# Patient Record
Sex: Female | Born: 1960
Health system: Southern US, Community
[De-identification: ages and names within clinical notes are randomized; demographics above are authoritative.]

## PROBLEM LIST (undated history)

## (undated) ENCOUNTER — Emergency Department (HOSPITAL_COMMUNITY): Discharge status: Absent without leave | Payer: Medicaid Other

## (undated) DIAGNOSIS — G479 Sleep disorder, unspecified: Secondary | ICD-10-CM

## (undated) DIAGNOSIS — K649 Unspecified hemorrhoids: Secondary | ICD-10-CM

## (undated) DIAGNOSIS — I10 Essential (primary) hypertension: Secondary | ICD-10-CM

## (undated) DIAGNOSIS — G4730 Sleep apnea, unspecified: Secondary | ICD-10-CM

## (undated) DIAGNOSIS — E7800 Pure hypercholesterolemia, unspecified: Secondary | ICD-10-CM

## (undated) DIAGNOSIS — E119 Type 2 diabetes mellitus without complications: Secondary | ICD-10-CM

## (undated) DIAGNOSIS — M1990 Unspecified osteoarthritis, unspecified site: Secondary | ICD-10-CM

## (undated) HISTORY — DX: Unspecified osteoarthritis, unspecified site: M19.90

## (undated) HISTORY — DX: Sleep apnea, unspecified: G47.30

## (undated) HISTORY — DX: Unspecified hemorrhoids: K64.9

## (undated) HISTORY — DX: Sleep disorder, unspecified: G47.9

## (undated) NOTE — *Deleted (*Deleted)
It was great to see you!  Our plans for today:  - *** -   We are checking some labs today, I will call you if they are abnormal will send you a MyChart message or a letter if they are normal.  If you do not hear about your labs in the next 2 weeks please let us know.***  Take care and seek immediate care sooner if you develop any concerns.   Dr. [name]  

---

## 1997-12-25 ENCOUNTER — Inpatient Hospital Stay (HOSPITAL_COMMUNITY): Admission: AD | Admit: 1997-12-25 | Discharge: 1997-12-25 | Payer: Self-pay | Admitting: *Deleted

## 1998-01-01 ENCOUNTER — Inpatient Hospital Stay (HOSPITAL_COMMUNITY): Admission: AD | Admit: 1998-01-01 | Discharge: 1998-01-03 | Payer: Self-pay | Admitting: *Deleted

## 1998-01-01 ENCOUNTER — Inpatient Hospital Stay (HOSPITAL_COMMUNITY): Admission: AD | Admit: 1998-01-01 | Discharge: 1998-01-01 | Payer: Self-pay | Admitting: *Deleted

## 1998-05-18 DIAGNOSIS — M1990 Unspecified osteoarthritis, unspecified site: Secondary | ICD-10-CM

## 1998-05-18 HISTORY — DX: Unspecified osteoarthritis, unspecified site: M19.90

## 2002-06-27 ENCOUNTER — Inpatient Hospital Stay (HOSPITAL_COMMUNITY): Admission: EM | Admit: 2002-06-27 | Discharge: 2002-06-28 | Payer: Self-pay | Admitting: Emergency Medicine

## 2002-06-27 ENCOUNTER — Encounter: Payer: Self-pay | Admitting: Emergency Medicine

## 2002-07-03 ENCOUNTER — Encounter: Admission: RE | Admit: 2002-07-03 | Discharge: 2002-07-03 | Payer: Self-pay | Admitting: Family Medicine

## 2003-05-19 DIAGNOSIS — I10 Essential (primary) hypertension: Secondary | ICD-10-CM

## 2003-05-19 DIAGNOSIS — E7800 Pure hypercholesterolemia, unspecified: Secondary | ICD-10-CM

## 2003-05-19 DIAGNOSIS — E119 Type 2 diabetes mellitus without complications: Secondary | ICD-10-CM

## 2003-05-19 HISTORY — DX: Essential (primary) hypertension: I10

## 2003-05-19 HISTORY — DX: Pure hypercholesterolemia, unspecified: E78.00

## 2003-05-19 HISTORY — DX: Type 2 diabetes mellitus without complications: E11.9

## 2004-04-09 ENCOUNTER — Emergency Department (HOSPITAL_COMMUNITY): Admission: EM | Admit: 2004-04-09 | Discharge: 2004-04-09 | Payer: Self-pay | Admitting: Emergency Medicine

## 2004-05-16 ENCOUNTER — Emergency Department (HOSPITAL_COMMUNITY): Admission: EM | Admit: 2004-05-16 | Discharge: 2004-05-16 | Payer: Self-pay | Admitting: Family Medicine

## 2005-04-07 ENCOUNTER — Emergency Department (HOSPITAL_COMMUNITY): Admission: EM | Admit: 2005-04-07 | Discharge: 2005-04-07 | Payer: Self-pay | Admitting: Family Medicine

## 2005-07-16 ENCOUNTER — Ambulatory Visit (HOSPITAL_COMMUNITY): Admission: RE | Admit: 2005-07-16 | Discharge: 2005-07-16 | Payer: Self-pay | Admitting: Obstetrics

## 2005-07-20 ENCOUNTER — Ambulatory Visit (HOSPITAL_COMMUNITY): Admission: RE | Admit: 2005-07-20 | Discharge: 2005-07-20 | Payer: Self-pay | Admitting: Obstetrics

## 2006-04-30 ENCOUNTER — Emergency Department (HOSPITAL_COMMUNITY): Admission: EM | Admit: 2006-04-30 | Discharge: 2006-04-30 | Payer: Self-pay | Admitting: Emergency Medicine

## 2007-10-17 ENCOUNTER — Inpatient Hospital Stay (HOSPITAL_COMMUNITY): Admission: EM | Admit: 2007-10-17 | Discharge: 2007-10-19 | Payer: Self-pay | Admitting: Emergency Medicine

## 2007-10-18 HISTORY — PX: CARDIAC CATHETERIZATION: SHX172

## 2007-11-14 ENCOUNTER — Emergency Department (HOSPITAL_COMMUNITY): Admission: EM | Admit: 2007-11-14 | Discharge: 2007-11-14 | Payer: Self-pay | Admitting: Emergency Medicine

## 2008-02-13 ENCOUNTER — Emergency Department (HOSPITAL_COMMUNITY): Admission: EM | Admit: 2008-02-13 | Discharge: 2008-02-13 | Payer: Self-pay | Admitting: Emergency Medicine

## 2008-05-18 DIAGNOSIS — G4730 Sleep apnea, unspecified: Secondary | ICD-10-CM

## 2008-05-18 HISTORY — DX: Sleep apnea, unspecified: G47.30

## 2009-10-04 ENCOUNTER — Emergency Department (HOSPITAL_COMMUNITY): Admission: EM | Admit: 2009-10-04 | Discharge: 2009-10-04 | Payer: Self-pay | Admitting: Emergency Medicine

## 2010-06-10 ENCOUNTER — Emergency Department (HOSPITAL_COMMUNITY)
Admission: EM | Admit: 2010-06-10 | Discharge: 2010-06-10 | Payer: Self-pay | Source: Home / Self Care | Admitting: Emergency Medicine

## 2010-06-11 LAB — COMPREHENSIVE METABOLIC PANEL
Albumin: 3.9 g/dL (ref 3.5–5.2)
Alkaline Phosphatase: 68 U/L (ref 39–117)
BUN: 9 mg/dL (ref 6–23)
Calcium: 9 mg/dL (ref 8.4–10.5)
Creatinine, Ser: 0.73 mg/dL (ref 0.4–1.2)
GFR calc Af Amer: >60 mL/min (ref >60)
GFR calc non Af Amer: >60 mL/min (ref >60)
Glucose, Bld: 127 mg/dL — ABNORMAL HIGH (ref 70–99)
Potassium: 3.5 mEq/L (ref 3.5–5.1)
Sodium: 138 mEq/L (ref 135–145)
Total Protein: 7 g/dL (ref 6.0–8.3)

## 2010-06-11 LAB — CBC
HCT: 41.9 % (ref 36.0–46.0)
Hemoglobin: 14.2 g/dL (ref 12.0–15.0)
MCH: 27.7 pg (ref 26.0–34.0)
MCHC: 33.9 g/dL (ref 30.0–36.0)
MCV: 81.8 fL (ref 78.0–100.0)
RBC: 5.12 MIL/uL — ABNORMAL HIGH (ref 3.87–5.11)
RDW: 14.2 % (ref 11.5–15.5)
WBC: 8 10*3/uL (ref 4.0–10.5)

## 2010-06-11 LAB — URINE MICROSCOPIC-ADD ON

## 2010-06-11 LAB — URINALYSIS, ROUTINE W REFLEX MICROSCOPIC
Ketones, ur: NEGATIVE mg/dL
Leukocytes, UA: NEGATIVE
Nitrite: POSITIVE — AB
Protein, ur: NEGATIVE mg/dL
Urine Glucose, Fasting: NEGATIVE mg/dL
Urobilinogen, UA: 1 mg/dL (ref 0.0–1.0)
pH: 5.5 (ref 5.0–8.0)

## 2010-06-11 LAB — LIPASE, BLOOD: Lipase: 21 U/L (ref 11–59)

## 2010-06-11 LAB — DIFFERENTIAL
Basophils Relative: 0 % (ref 0–1)
Eosinophils Absolute: 0.1 10*3/uL (ref 0.0–0.7)
Eosinophils Relative: 2 % (ref 0–5)
Lymphocytes Relative: 21 % (ref 12–46)
Lymphs Abs: 1.7 10*3/uL (ref 0.7–4.0)
Monocytes Absolute: 0.3 10*3/uL (ref 0.1–1.0)
Monocytes Relative: 4 % (ref 3–12)
Neutro Abs: 5.8 10*3/uL (ref 1.7–7.7)
Neutrophils Relative %: 73 % (ref 43–77)

## 2010-06-12 LAB — URINE CULTURE
Colony Count: >=100000
Culture  Setup Time: 201201241858

## 2010-09-30 NOTE — Cardiovascular Report (Signed)
[name], [name] NO.:  [id number]   MEDICAL RECORD NO.:  [id number]          PATIENT TYPE:  INP   LOCATION:  3703                         FACILITY:  [facility]   PHYSICIAN:  Nicki Guadalajara, M.D.     DATE OF BIRTH:  [birth year]   DATE OF PROCEDURE:  10/18/2007  DATE OF DISCHARGE:                            CARDIAC CATHETERIZATION   INDICATIONS:  [name] is a 50 year old African American  female who has a history of morbid obesity, tobacco use, and strong  family history of coronary artery disease.  She was admitted to [facility] last evening after experiencing several hours of  substernal chest pressure/tightness.  Her ECG showed nonspecific T  changes.  Symptom complex was worrisome for possible unstable angina.  She was treated with nitroglycerin and heparin.  With her significant  obesity and risk factors definitive cardiac catheterization was  recommended.   PROCEDURE:  After premedication with Versed 2 mg intravenously, the  patient was prepped and draped in usual fashion.  Her right femoral  artery was punctured anteriorly and a 5-French sheath was inserted.  Diagnostic catheterization was done utilizing 5-French Judkins for left  and right coronary catheters.  A 5-French pigtail catheter was used for  biplane cine left ventriculography.  With the patient's significant  hypertensive history.  Distal aortography was also performed to make  sure and she did not have a renovascular etiology to her hypertension.   She tolerated the procedure well.  Hemostasis was obtained by direct  manual pressure.   HEMODYNAMIC DATA:  Initial central aortic pressure is 170/96, left  ventricle pressure 170/17, and post A-wave 25.   On pullback, left ventricle pressure is 182/22, post A-wave 30, and  central aortic pressure is 182/102.  Of note, at the completion of the  procedure the patient also received 10 mg IV labetalol.   ANGIOGRAPHIC DATA:  Left  main coronary artery was angiographically  normal and bifurcated into an LAD and left circumflex system.   The LAD was angiographically normal, gave rise to one major diagonal  vessel and central and peripheral arteries.   The left circumflex coronary artery  was angiographically normal and  gave rise to a PD and PLA system.   The right coronary was angiographically normal, small-caliber vessel.   Biplane cine left ventriculography revealed normal LV contractility.  There was a suggestion of left ventricle hypertrophy.   Distal aortography revealed a normal aortoiliac system.  Specifically,  there was no evidence for renal artery stenosis.   IMPRESSION:  1. Normal left ventricular function with suggestion of left ventricle      hypertrophy.  2. Systemic hypertension.  3. Normal coronary arteries.           ______________________________  Nicki Guadalajara, M.D.     TK/MEDQ  D:  10/18/2007  T:  10/19/2007  Job:  [id number]

## 2010-09-30 NOTE — Discharge Summary (Signed)
NAMEREITA, Tanya Owens NO.:  [id number]   MEDICAL RECORD NO.:  [id number]          PATIENT TYPE:  INP   LOCATION:  3703                         FACILITY:  [facility]   PHYSICIAN:  Nicki Guadalajara, M.D.     DATE OF BIRTH:  May 29, 1960   DATE OF ADMISSION:  10/17/2007  DATE OF DISCHARGE:  10/19/2007                               DISCHARGE SUMMARY   DISCHARGE DIAGNOSES:  1. Acute new-onset chest pain with abnormal EKG and negative cardiac      enzymes, status post catheterization with normal coronaries.  2. Hypertension.  3. Suspected obstructive sleep apnea - Needs sleep studies.  4. Morbid obesity.  5. Hyperglycemia with mildly elevated hemoglobin A1c.  6. Dyslipidemia with LDL of 111, low HDL of 29, and elevated      triglycerides to 219 with normal total cholesterol.  7. Ongoing tobacco use.   This is a 50 year old, obese, African-American female without any  previous history of cardiac disease who presented to the emergency room  with complaints of a chest pain.  She ate lunch and then probably an  half hour after, developed substernal chest pain, which she described as  a sledgehammer with radiation to the left chest.  She also felt  diaphoretic, but did not have any nausea or shortness of breath.  She  rated her pain as 10/10 and presented to the emergency room where she  was evaluated and admitted to the hospital to rule out MI protocol.  She  was started immediately on IV nitroglycerin and heparin with improvement  of symptoms.   The following day, Dr. Tresa Endo performed coronary angiography that  revealed normal coronaries and normal left ventricular systolic  function.   LABORATORY DATA:  BMP showed sodium 142, potassium 4.0, chloride 112,  CO2 24, glucose 125, BUN 8, and creatinine 0.91.  The BMET on October 18, 2007, showed elevated glucose as well at 126.  Her hemoglobin A1c was  mildly elevated at 6.6.  Cardiac panel, first set revealed CK 128, CK-MB  2.0, and  troponin 0.01.  Second set, CK 98, CK-MB 1.5, and troponin less  than 0.01.  Third set, CK 115, CK-MB 1.3, and troponin less than 0.01.   Lipid profile showed total cholesterol 184, triglycerides 219, HDL 29,  LDL 111, and CBC showed white blood cell count 8.3, hemoglobin of 12,  hematocrit 34.8, and platelet count 233.   DISCHARGE MEDICATIONS:  1. Norvasc 5 mg daily.  2. Toprol-XL 25 mg daily.  3. Protonix 40 mg daily.  4. Zocor 20 mg daily.   DISCHARGE INSTRUCTIONS:  The patient is to stay on low-fat, low-  cholesterol diet, and avoid sweets and sugar.  She was instructed not to  drive or lift greater than 5 pounds for few days post cath and increase  activity slowly.   The patient was informed to discontinue smoking, but the issue would  have to be addressed and pressed as outpatient.   Outpatient follow up with Dr. Tresa Endo in our office on June 19 at 8:45.  We also would like to refer her to  Dr. Lurene Shadow, which could be done as  outpatient after we see her in our office.      Raymon Mutton, P.A.    ______________________________  Nicki Guadalajara, M.D.    MK/MEDQ  D:  10/19/2007  T:  10/20/2007  Job:  [id number]   cc:   [facility] Dr. Estelle June, M.D.

## 2010-10-03 NOTE — H&P (Signed)
NAME:  Tanya Owens, Tanya Owens                        ACCOUNT NO.:  [id number]   MEDICAL RECORD NO.:  [id number]                   PATIENT TYPE:  INP   LOCATION:  1824                                 FACILITY:  [facility]   PHYSICIAN:  Santiago Bumpers. Hensel, M.D.             DATE OF BIRTH:  1961/01/03   DATE OF ADMISSION:  06/27/2002  DATE OF DISCHARGE:                                HISTORY & PHYSICAL   CHIEF COMPLAINT:  Dizziness and chest pain.   HISTORY OF PRESENT ILLNESS:  This 50 year old complains of dizziness and  lightheadedness with decreased ability to walk at approximately 3 a.m.  today.  She states she awoke from sleep due to some sharp substernal chest  pain.  When she rose to walk to the bathroom, she had severe dizziness and  had to use assistance with holding onto furniture in the room.  She  complained that the room was spinning for a good 20-25 minutes.  She denies  any nausea or vomiting.  She denies any URI-type signs or symptoms in the  previous days or weeks.  She does state she had some nausea last night  before lying down, but no vomiting or diarrhea.  The patient describes a chest pain as sharp, substernal pain.  She denies  any radiation to the arms or the jaw, but she does complain of some numbness  in the bilateral hands shortly after the pain onset.  She denies any  diaphoresis or shortness of breath at the time of the chest pain onset;  however, she does state she had some dyspnea on exertion when getting  dressed to await the EMTs arrival.   PAST MEDICAL HISTORY:  Obesity.   PAST SURGICAL HISTORY:  None.   ALLERGIES:  No known drug allergies.   MEDICATIONS:  None.   FAMILY HISTORY:  Diabetes in her mother.  Hypertension in her mother.  CVA  in a maternal uncle.  Colon cancer in a maternal uncle.  Breast cancer in a  maternal aunt.   SOCIAL HISTORY:  She smokes one pack per day.  She denies illicit drug use.  Denies alcohol use.  She lives with her mother  and three children, ages 61,  87, and 33.  She denies any ill contacts recently.  She is unemployed and on  welfare.   REVIEW OF SYSTEMS:  CONSTITUTIONAL:  No changes in weight or appetite.  HEENT:  No URI signs or symptoms.  Denies any hearing changes.  Eyes:  No  visual changes.  She states she needs reading glasses.  GI:  She has a  chronic upper abdominal pain and some back pain that is chronic for over one  year.  Also states she has some chronic epigastric pain and chronic  constipation.  Denies any vomiting or diarrhea.  GENITOURINARY:  Denies any  urinary tract infection signs or symptoms or vaginitis signs or symptoms.  RESPIRATORY:  Dyspnea on exertion, as above.  No cough.  CARDIAC:  No  orthopnea, PND.  Chest pain as above.  ENDO:  No polyuria or polydipsia.  HEME:  No easy bruising.   PHYSICAL EXAMINATION:  VITAL SIGNS:  Blood pressure systolic 130-135/70s.  Heart rate 70-80, respirations 16-18.  Saturation 97% on room air.  GENERAL:  Normocephalic, atraumatic.  Oriented x3.  HEENT:  Ears clear.  Eyes:  Pupils equal, round, reactive to light and  accommodation.  No nystagmus.  Oropharynx non-erythematous.  Mucosa is  slightly dry.  NECK:  Supple, no lymphadenopathy.  LUNGS:  Clear to auscultation bilaterally.  No wheezes, rhonchi, or rales.  HEART:  Regular rate and rhythm.  No MRG.  ABDOMEN:  Positive BS, soft.  Complains of mild tenderness in the right  upper quadrant but no guarding.  EXTREMITIES:  No clubbing, cyanosis, or edema.  FROM.  CHEST:  Slight tenderness to palpation of the sternum.   PROCEDURE:  The patient was given aspirin and oxygen in the ambulance.  The  patient states that the aspirin decreased her pain from a 10/10 to a 3/10.   LABORATORY DATA:  Electrocardiogram:  Normal sinus rhythm.  Chest x-ray:  Poor inspiration, no infiltrate.  White count 9.2,  hemoglobin 11.5, platelets 25.4, MCV 78.8, RDW 16.2.  PTT  30.  D-dimer less than 0.22.  Lipase 22,  within normal limits.  Sodium 138,  potassium 3.5, BUN 7, creatinine 0.6, glucose 111.  Liver function tests  within normal limits.  Cardiac enzymes within normal limits.   ASSESSMENT:  1. Chest pain:  This is atypical chest pain, and it was relieved by aspirin     in the ambulance.  There is some evidence of reproducible chest pain to     palpation, and she may have some costochondritis.   PLAN:  We will admit for a 23-hour observation and rule out an acute  myocardial infarction, and risk stratify with a fasting lipid panel.  1. Dizziness:  This is of unclear etiology, as the patient has no history of     a upper respiratory infection for viral labyrinthitis, and no evidence of     dehydration.  Orthostatics checked by the [facility]     showed no changes.   PLAN:  We will give supportive care and monitor.  1. Anemia:  This is microcytic.   PLAN:  We will check a Ferritin and a percent saturation and replace iron if  this is found to be iron deficiency.  1. Chronic abdominal pain:  This is not associated with meals or activity.     She complains of right upper quadrant tenderness to palpation, but the     liver function tests and lipase are well within normal limits, and she     has no McMurray's sign.  She does have a history of constipation.   PLAN:  Therefore will treat with stool softeners and follow.  Will also  check an H. Pylori.       Maryelizabeth Rowan, M.D.                     William A. Leveda Anna, M.D.    ED/MEDQ  D:  06/27/2002  T:  06/27/2002  Job:  [id number]   cc:   Dala Dock

## 2010-10-03 NOTE — Discharge Summary (Signed)
NAME:  Tanya Owens, Tanya Owens                        ACCOUNT NO.:  [id number]   MEDICAL RECORD NO.:  [id number]                   PATIENT TYPE:  INP   LOCATION:  4738                                 FACILITY:  [facility]   PHYSICIAN:  Rodolph Bong, M.D.                  DATE OF BIRTH:  1960/08/24   DATE OF ADMISSION:  06/27/2002  DATE OF DISCHARGE:  06/28/2002                                 DISCHARGE SUMMARY   DISCHARGE DIAGNOSES:  1. Atypical chest pain.  2. Dizziness/vertigo.  3. Iron deficiency anemia.   DISCHARGE MEDICATIONS:  1. Meclizine 25 mg p.o. q.6h. p.r.n. dizziness.  2. Iron sulfate 325 mg p.o. t.i.d. for anemia.   HOSPITAL COURSE:  The patient is a 50 year old African-American female who  presents with complaint of dizziness and light-headedness with decreased  ability to walk since early this morning. She states she awoke from sleep  with some sharp, substernal chest pain. When she would walk to the bathroom  she had a severe dizziness and would need assistance to ambulate. She had  complained that the room was spinning. Denied any nausea or vomiting. She  does describe the chest pain as sharp, substernal pain. Denied any radiation  to the arm or jaw, but complains of some numbness in bilateral hands. She  denied any shortness of breath. She was transported to the ED by EMS and  admitted for rule out MI.   Problem 1.  Atypical chest pain. The patient was found to have history as  outlined above. In the ED she was found to have stable vital signs. Physical  exam was nonrevealing except some questionable reproducible chest pain with  palpation. Her EKG was normal sinus rhythm. We did obtain three sets of  cardiac enzymes, which were negative. She did not have any events on  telemetry.   Problem 2.  Dizziness/vertigo.  This complaint was relieved with some  meclizine while in the hospital. A question of a combination of an inner ear  process versus slight anemia. She will  be discharged on meclizine and iron  sulfate as described above.   Problem 3.  Iron deficiency anemia. The patient was found to have a ferratin  5 with a TIBC of 34%, saturation down to 22. Consequently she will be  started on iron sulfate by mouth.   PROCEDURE:  None.   CONSULTATIONS:  None.   LABORATORY DATA:  Pending at the this time. Labs pending at the time of  discharge is an H. pylori.    SPECIMENS:  1. The patient was advised to use Colace for a stool softener 100 mg p.o.     b.i.d. as needed for constipation.  2. The patient advised follow-up with [facility]. The patient was given     the phone number [phone] to call for a follow-up appointment in the next     one to two weeks.  Rodolph Bong, M.D.    AK/MEDQ  D:  06/28/2002  T:  06/28/2002  Job:  [id number]

## 2010-12-09 ENCOUNTER — Emergency Department (HOSPITAL_COMMUNITY)
Admission: EM | Admit: 2010-12-09 | Discharge: 2010-12-09 | Disposition: A | Discharge status: Home / Self Care | Payer: Medicaid Other | Attending: Emergency Medicine | Admitting: Emergency Medicine

## 2010-12-09 DIAGNOSIS — I10 Essential (primary) hypertension: Secondary | ICD-10-CM | POA: Insufficient documentation

## 2010-12-09 DIAGNOSIS — T189XXA Foreign body of alimentary tract, part unspecified, initial encounter: Secondary | ICD-10-CM | POA: Insufficient documentation

## 2010-12-09 DIAGNOSIS — IMO0002 Reserved for concepts with insufficient information to code with codable children: Secondary | ICD-10-CM | POA: Insufficient documentation

## 2011-02-12 LAB — DIFFERENTIAL
Eosinophils Absolute: 0.2
Lymphocytes Relative: 33
Lymphs Abs: 2.7
Neutro Abs: 4.7
Neutrophils Relative %: 57

## 2011-02-12 LAB — HCG, SERUM, QUALITATIVE: Preg, Serum: NEGATIVE

## 2011-02-12 LAB — BASIC METABOLIC PANEL
BUN: 12
CO2: 24
Calcium: 8.6
Calcium: 8.8
Chloride: 108
Creatinine, Ser: 0.84
GFR calc Af Amer: >60
GFR calc Af Amer: >60
GFR calc non Af Amer: >60
GFR calc non Af Amer: >60
Glucose, Bld: 125 — ABNORMAL HIGH
Glucose, Bld: 126 — ABNORMAL HIGH
Sodium: 140
Sodium: 142

## 2011-02-12 LAB — POCT CARDIAC MARKERS
CKMB, poc: 1.4
Myoglobin, poc: 58.1
Operator id: 270651
Troponin i, poc: <0.05

## 2011-02-12 LAB — CARDIAC PANEL(CRET KIN+CKTOT+MB+TROPI)
CK, MB: 1.3
CK, MB: 1.5
Relative Index: 1.1
Relative Index: INVALID
Total CK: 115
Total CK: 98

## 2011-02-12 LAB — CK TOTAL AND CKMB (NOT AT ARMC)
CK, MB: 2
Relative Index: 1.6

## 2011-02-12 LAB — CBC
Hemoglobin: 11.4 — ABNORMAL LOW
Platelets: 233
RBC: 4.36
WBC: 10.3
WBC: 8.3

## 2011-02-12 LAB — PROTIME-INR
INR: 1
Prothrombin Time: 13.6

## 2011-02-12 LAB — LIPID PANEL
Cholesterol: 184
LDL Cholesterol: 111 — ABNORMAL HIGH
VLDL: 44 — ABNORMAL HIGH

## 2011-02-12 LAB — HEMOGLOBIN A1C: Hgb A1c MFr Bld: 6.6 — ABNORMAL HIGH

## 2011-02-16 LAB — POCT CARDIAC MARKERS: Myoglobin, poc: 60.3

## 2011-02-16 LAB — POCT I-STAT, CHEM 8
Calcium, Ion: 1.08 — ABNORMAL LOW
Chloride: 109
HCT: 32 — ABNORMAL LOW
Sodium: 138

## 2011-02-26 ENCOUNTER — Other Ambulatory Visit: Payer: Self-pay | Admitting: Obstetrics & Gynecology

## 2011-02-26 DIAGNOSIS — Z1231 Encounter for screening mammogram for malignant neoplasm of breast: Secondary | ICD-10-CM

## 2011-03-16 ENCOUNTER — Ambulatory Visit (HOSPITAL_COMMUNITY): Payer: Medicaid Other | Attending: Obstetrics & Gynecology

## 2012-05-26 ENCOUNTER — Emergency Department (HOSPITAL_COMMUNITY)
Admission: EM | Admit: 2012-05-26 | Discharge: 2012-05-27 | Disposition: A | Discharge status: Home / Self Care | Payer: No Typology Code available for payment source | Attending: Emergency Medicine | Admitting: Emergency Medicine

## 2012-05-26 ENCOUNTER — Encounter (HOSPITAL_COMMUNITY): Payer: Self-pay | Admitting: Emergency Medicine

## 2012-05-26 DIAGNOSIS — E119 Type 2 diabetes mellitus without complications: Secondary | ICD-10-CM | POA: Insufficient documentation

## 2012-05-26 DIAGNOSIS — I10 Essential (primary) hypertension: Secondary | ICD-10-CM | POA: Insufficient documentation

## 2012-05-26 DIAGNOSIS — S0990XA Unspecified injury of head, initial encounter: Secondary | ICD-10-CM | POA: Insufficient documentation

## 2012-05-26 DIAGNOSIS — Y92410 Unspecified street and highway as the place of occurrence of the external cause: Secondary | ICD-10-CM | POA: Insufficient documentation

## 2012-05-26 DIAGNOSIS — Y939 Activity, unspecified: Secondary | ICD-10-CM | POA: Insufficient documentation

## 2012-05-26 DIAGNOSIS — F17200 Nicotine dependence, unspecified, uncomplicated: Secondary | ICD-10-CM | POA: Insufficient documentation

## 2012-05-26 DIAGNOSIS — E7800 Pure hypercholesterolemia, unspecified: Secondary | ICD-10-CM | POA: Insufficient documentation

## 2012-05-26 DIAGNOSIS — Z79899 Other long term (current) drug therapy: Secondary | ICD-10-CM | POA: Insufficient documentation

## 2012-05-26 DIAGNOSIS — IMO0002 Reserved for concepts with insufficient information to code with codable children: Secondary | ICD-10-CM | POA: Insufficient documentation

## 2012-05-26 DIAGNOSIS — T148XXA Other injury of unspecified body region, initial encounter: Secondary | ICD-10-CM

## 2012-05-26 HISTORY — DX: Type 2 diabetes mellitus without complications: E11.9

## 2012-05-26 HISTORY — DX: Essential (primary) hypertension: I10

## 2012-05-26 HISTORY — DX: Pure hypercholesterolemia, unspecified: E78.00
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
## 2012-05-27 ENCOUNTER — Encounter (HOSPITAL_COMMUNITY): Payer: Self-pay | Admitting: Radiology

## 2012-05-27 ENCOUNTER — Emergency Department (HOSPITAL_COMMUNITY): Payer: No Typology Code available for payment source

## 2012-05-27 MED ORDER — IBUPROFEN 600 MG PO TABS: 600.0000 mg | ORAL_TABLET | Freq: Four times a day (QID) | ORAL | Status: DC | PRN

## 2012-05-27 MED ORDER — HYDROCODONE-ACETAMINOPHEN 5-325 MG PO TABS
2.0000 | ORAL_TABLET | Freq: Once | ORAL | Status: AC
  Administered 2012-05-27: 2 via ORAL
  Filled 2012-05-27: qty 2

## 2012-05-27 MED ORDER — HYDROCODONE-ACETAMINOPHEN 5-325 MG PO TABS: 1.0000 | ORAL_TABLET | ORAL | Status: DC | PRN

## 2012-05-27 NOTE — ED Notes (Signed)
Patient transported to X-ray 
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
## 2013-02-06 ENCOUNTER — Other Ambulatory Visit: Payer: Self-pay | Admitting: *Deleted

## 2013-02-06 MED ORDER — HYDROCHLOROTHIAZIDE 12.5 MG PO CAPS: 12.5000 mg | ORAL_CAPSULE | Freq: Every day | ORAL | Status: DC

## 2013-02-06 MED ORDER — AMLODIPINE BESYLATE 5 MG PO TABS: 5.0000 mg | ORAL_TABLET | Freq: Every day | ORAL | Status: DC

## 2013-02-06 MED ORDER — METOPROLOL SUCCINATE ER 50 MG PO TB24: 50.0000 mg | ORAL_TABLET | Freq: Every day | ORAL | Status: DC

## 2013-05-19 ENCOUNTER — Other Ambulatory Visit: Payer: Self-pay | Admitting: *Deleted

## 2013-05-19 MED ORDER — ATORVASTATIN CALCIUM 40 MG PO TABS: 40.0000 mg | ORAL_TABLET | Freq: Every day | ORAL | Status: DC

## 2013-05-19 MED ORDER — METFORMIN HCL 500 MG PO TABS: 500.0000 mg | ORAL_TABLET | Freq: Two times a day (BID) | ORAL | Status: DC

## 2013-05-24 ENCOUNTER — Encounter (HOSPITAL_COMMUNITY): Payer: Self-pay | Admitting: Emergency Medicine

## 2013-05-24 ENCOUNTER — Emergency Department (HOSPITAL_COMMUNITY): Payer: Medicaid Other

## 2013-05-24 ENCOUNTER — Emergency Department (HOSPITAL_COMMUNITY)
Admission: EM | Admit: 2013-05-24 | Discharge: 2013-05-24 | Disposition: A | Discharge status: Home / Self Care | Payer: Medicaid Other | Attending: Emergency Medicine | Admitting: Emergency Medicine

## 2013-05-24 DIAGNOSIS — E7800 Pure hypercholesterolemia, unspecified: Secondary | ICD-10-CM | POA: Insufficient documentation

## 2013-05-24 DIAGNOSIS — G609 Hereditary and idiopathic neuropathy, unspecified: Secondary | ICD-10-CM | POA: Insufficient documentation

## 2013-05-24 DIAGNOSIS — R200 Anesthesia of skin: Secondary | ICD-10-CM

## 2013-05-24 DIAGNOSIS — G629 Polyneuropathy, unspecified: Secondary | ICD-10-CM

## 2013-05-24 DIAGNOSIS — E1149 Type 2 diabetes mellitus with other diabetic neurological complication: Secondary | ICD-10-CM | POA: Insufficient documentation

## 2013-05-24 DIAGNOSIS — Z87891 Personal history of nicotine dependence: Secondary | ICD-10-CM | POA: Insufficient documentation

## 2013-05-24 DIAGNOSIS — R739 Hyperglycemia, unspecified: Secondary | ICD-10-CM

## 2013-05-24 DIAGNOSIS — R209 Unspecified disturbances of skin sensation: Secondary | ICD-10-CM | POA: Insufficient documentation

## 2013-05-24 DIAGNOSIS — I10 Essential (primary) hypertension: Secondary | ICD-10-CM | POA: Insufficient documentation

## 2013-05-24 DIAGNOSIS — Z79899 Other long term (current) drug therapy: Secondary | ICD-10-CM | POA: Insufficient documentation

## 2013-05-24 DIAGNOSIS — H538 Other visual disturbances: Secondary | ICD-10-CM | POA: Insufficient documentation

## 2013-05-24 LAB — CBC
HCT: 40 % (ref 36.0–46.0)
HEMOGLOBIN: 14.1 g/dL (ref 12.0–15.0)
MCH: 27.8 pg (ref 26.0–34.0)
MCHC: 35.3 g/dL (ref 30.0–36.0)
MCV: 78.7 fL (ref 78.0–100.0)
Platelets: 223 10*3/uL (ref 150–400)
RBC: 5.08 MIL/uL (ref 3.87–5.11)
RDW: 13.4 % (ref 11.5–15.5)
WBC: 8.3 10*3/uL (ref 4.0–10.5)

## 2013-05-24 LAB — URINALYSIS, ROUTINE W REFLEX MICROSCOPIC
Bilirubin Urine: NEGATIVE
Glucose, UA: >1000 mg/dL — AB
Ketones, ur: NEGATIVE mg/dL
Leukocytes, UA: NEGATIVE
NITRITE: NEGATIVE
Protein, ur: 30 mg/dL — AB
Specific Gravity, Urine: 1.037 — ABNORMAL HIGH (ref 1.005–1.030)
UROBILINOGEN UA: 0.2 mg/dL (ref 0.0–1.0)
pH: 5.5 (ref 5.0–8.0)

## 2013-05-24 LAB — COMPREHENSIVE METABOLIC PANEL
ALT: 15 U/L (ref 0–35)
AST: 11 U/L (ref 0–37)
Albumin: 4.1 g/dL (ref 3.5–5.2)
Alkaline Phosphatase: 131 U/L — ABNORMAL HIGH (ref 39–117)
BUN: 12 mg/dL (ref 6–23)
CALCIUM: 9.3 mg/dL (ref 8.4–10.5)
CO2: 23 meq/L (ref 19–32)
CREATININE: 0.68 mg/dL (ref 0.50–1.10)
Chloride: 94 mEq/L — ABNORMAL LOW (ref 96–112)
GLUCOSE: 432 mg/dL — AB (ref 70–99)
Potassium: 4.4 mEq/L (ref 3.7–5.3)
Sodium: 134 mEq/L — ABNORMAL LOW (ref 137–147)
Total Bilirubin: 0.4 mg/dL (ref 0.3–1.2)
Total Protein: 7.7 g/dL (ref 6.0–8.3)

## 2013-05-24 LAB — POCT I-STAT 3, VENOUS BLOOD GAS (G3P V)
Acid-Base Excess: 3 mmol/L — ABNORMAL HIGH (ref 0.0–2.0)
Bicarbonate: 28.4 mEq/L — ABNORMAL HIGH (ref 20.0–24.0)
O2 Saturation: 58 %
PH VEN: 7.388 — AB (ref 7.250–7.300)
PO2 VEN: 31 mmHg (ref 30.0–45.0)
TCO2: 30 mmol/L (ref 0–100)
pCO2, Ven: 47.1 mmHg (ref 45.0–50.0)

## 2013-05-24 LAB — GLUCOSE, CAPILLARY: GLUCOSE-CAPILLARY: 446 mg/dL — AB (ref 70–99)

## 2013-05-24 LAB — URINE MICROSCOPIC-ADD ON

## 2013-05-24 LAB — POCT I-STAT TROPONIN I: Troponin i, poc: 0 ng/mL (ref 0.00–0.08)

## 2013-05-24 LAB — D-DIMER, QUANTITATIVE: D-Dimer, Quant: <0.27 ug/mL-FEU (ref 0.00–0.48)

## 2013-05-24 MED ORDER — SODIUM CHLORIDE 0.9 % IV BOLUS (SEPSIS)
1000.0000 mL | Freq: Once | INTRAVENOUS | Status: AC
  Administered 2013-05-24: 1000 mL via INTRAVENOUS

## 2013-05-24 NOTE — ED Notes (Signed)
Attempted X1 to insert heplock unsuccesful.  Paged IV nurse, she will be down to assess

## 2013-05-24 NOTE — ED Notes (Signed)
CBG 255 

## 2013-05-24 NOTE — ED Notes (Signed)
Pt is here with right arm/hand numbness with tingling in fingers and woke up with symptoms and reports painful left arm and left leg.  Pt reports mouth is dry and reports increased thirst and urination.  CBG at home was [phone]

## 2013-05-24 NOTE — ED Notes (Signed)
NOTIFIED DR. GOLDSTON OF PATIENTS LAB RESULTS OF VENOUS BLOOD GAS, 05/24/2013.

## 2013-05-24 NOTE — ED Provider Notes (Signed)
CSN: [id number]     Arrival date & time 05/24/13  1344 History   First MD Initiated Contact with Patient 05/24/13 1503     Chief Complaint  Patient presents with  . Numbness   (Consider location/radiation/quality/duration/timing/severity/associated sxs/prior Treatment) HPI Comments: 53 year old female presents with right arm and hand numbness that started this morning. She states she woke up with these symptoms 9 hours prior to her being seen in the ED. She states about 4 hours ago she started having left arm and left leg pain. States the pain is our about 9/10 in intensity. She states they feel like cramping. There is no weakness. She states the numbness and tingling in her arm is localized to her middle, ring, and pinky finger and it goes up her arm to do above her elbow. She's not had any headaches, neck pain, or any trauma. She's never had symptoms like this before. She states her sugar this morning it was over 400. She and states her sugars are always in low 100s. She's never missed any doses of her metformin. Denies a recent illnesses. Denies any fevers, chills, cough, headache, or neck pain. She states she has had some blurry vision over the last couple days.   Past Medical History  Diagnosis Date  . Diabetes mellitus without complication   . Hypertension   . Hypercholesteremia    No past surgical history on file. No family history on file. History  Substance Use Topics  . Smoking status: Former Games developer  . Smokeless tobacco: Not on file  . Alcohol Use: No   OB History   Grav Para Term Preterm Abortions TAB SAB Ect Mult Living                 Review of Systems  Constitutional: Negative for fever and chills.  HENT: Negative for congestion.   Eyes: Positive for visual disturbance (Blurry vision).  Respiratory: Negative for cough and shortness of breath.   Cardiovascular: Negative for chest pain.  Gastrointestinal: Negative for nausea, vomiting and abdominal pain.   Genitourinary: Negative for dysuria.  Musculoskeletal: Negative for back pain, neck pain and neck stiffness.  Neurological: Positive for numbness. Negative for dizziness, syncope, speech difficulty, weakness, light-headedness and headaches.  All other systems reviewed and are negative.    Allergies  Review of patient's allergies indicates no known allergies.  Home Medications   Current Outpatient Rx  Name  Route  Sig  Dispense  Refill  . amLODipine (NORVASC) 5 MG tablet   Oral   Take 1 tablet (5 mg total) by mouth daily.   30 tablet   3   . atorvastatin (LIPITOR) 40 MG tablet   Oral   Take 1 tablet (40 mg total) by mouth daily.   30 tablet   0   . hydrochlorothiazide (MICROZIDE) 12.5 MG capsule   Oral   Take 1 capsule (12.5 mg total) by mouth daily.   30 capsule   3   . metFORMIN (GLUCOPHAGE) 500 MG tablet   Oral   Take 1 tablet (500 mg total) by mouth 2 (two) times daily with a meal.   30 tablet   0   . metoprolol succinate (TOPROL-XL) 50 MG 24 hr tablet   Oral   Take 1 tablet (50 mg total) by mouth daily. Take with or immediately following a meal.   30 tablet   3    BP 147/68  Pulse 88  Temp(Src) 98 F (36.7 C) (Oral)  Resp 18  SpO2 97% Physical Exam  Nursing note and vitals reviewed. Constitutional: She is oriented to person, place, and time. She appears well-developed and well-nourished. No distress.  HENT:  Head: Normocephalic and atraumatic.  Right Ear: External ear normal.  Left Ear: External ear normal.  Nose: Nose normal.  Eyes: EOM are normal. Pupils are equal, round, and reactive to light. Right eye exhibits no discharge. Left eye exhibits no discharge.  Neck: Neck supple.  Cardiovascular: Normal rate, regular rhythm and normal heart sounds.   Pulmonary/Chest: Effort normal and breath sounds normal.  Abdominal: Soft. She exhibits no distension. There is no tenderness.  Musculoskeletal:       Left forearm: She exhibits tenderness. She  exhibits no swelling.       Left lower leg: She exhibits tenderness. She exhibits no bony tenderness and no swelling.  Neurological: She is alert and oriented to person, place, and time. She has normal strength. No cranial nerve deficit. Coordination normal. She displays no Babinski's sign on the right side. She displays no Babinski's sign on the left side.  5 out of 5 strength in all 4 extremities. Cranial nerves II through XII are grossly intact. Patient has decreased sensation to the medial aspect of her right arm. She is unable to tell them between sharp and dull on the medial aspect. She has normal sensation in the other extremities.  Skin: Skin is warm and dry.    ED Course  Procedures (including critical care time) Labs Review Labs Reviewed  COMPREHENSIVE METABOLIC PANEL - Abnormal; Notable for the following:    Sodium 134 (*)    Chloride 94 (*)    Glucose, Bld 432 (*)    Alkaline Phosphatase 131 (*)    All other components within normal limits  URINALYSIS, ROUTINE W REFLEX MICROSCOPIC - Abnormal; Notable for the following:    Specific Gravity, Urine 1.037 (*)    Glucose, UA >1000 (*)    Hgb urine dipstick TRACE (*)    Protein, ur 30 (*)    All other components within normal limits  GLUCOSE, CAPILLARY - Abnormal; Notable for the following:    Glucose-Capillary 446 (*)    All other components within normal limits  POCT I-STAT 3, BLOOD GAS (G3P V) - Abnormal; Notable for the following:    pH, Ven 7.388 (*)    Bicarbonate 28.4 (*)    Acid-Base Excess 3.0 (*)    All other components within normal limits  CBC  URINE MICROSCOPIC-ADD ON  D-DIMER, QUANTITATIVE  POCT I-STAT TROPONIN I   Imaging Review Ct Head Wo Contrast  05/24/2013   CLINICAL DATA:  Right arm numbness.  Left arm pain.  EXAM: CT HEAD WITHOUT CONTRAST  TECHNIQUE: Contiguous axial images were obtained from the base of the skull through the vertex without intravenous contrast.  COMPARISON:  05/27/2012  FINDINGS: No  mass effect, midline shift, or acute intracranial hemorrhage. Ventricular system, extra-axial space, and brain parenchyma are unremarkable. Mastoid air cells and visualized paranasal sinuses are clear. Intact cranium.  IMPRESSION: Negative PET-CT.   Electronically Signed   By: Maryclare Bean M.D.   On: 05/24/2013 16:15    EKG Interpretation    Date/Time:  Wednesday May 24 2013 13:54:10 EST Ventricular Rate:  88 PR Interval:  144 QRS Duration: 94 QT Interval:  376 QTC Calculation: 454 R Axis:   -8 Text Interpretation:  Normal sinus rhythm Minimal voltage criteria for LVH, may be normal variant Possible Anterior infarct , age undetermined Abnormal ECG  No significant change since last tracing Confirmed by Alauna Hayden  MD, Ameka Krigbaum ([id number]) on 05/24/2013 3:04:25 PM            MDM   1. Hyperglycemia without ketosis   2. Numbness   3. Peripheral neuropathy    Patient with hyperglycemia and neuropathy. Well appearing otherwise. Has point tenderness to left calf and forearm, and sensory changes as above to right arm. D/w Dr. Roseanne Reno (on call neurology) who feels the numbness represents a peripheral neuropathy, either from sleeping wrong or diabetes. Does not feel this represents a stroke or needs further stroke w/u like an MRI. Recommends outpatient neuro f/u for possible nerve conduction studies. No signs of DKA. Left arm pain not c/w ACS, and has neg trop and EKG. Will d/c with symptomatic care and need for close PCP f/u for better glucose control.    Audree Camel, MD 05/25/13 [phone]

## 2013-05-24 NOTE — ED Notes (Signed)
Pt comfortable with d/c and f/u instructions. No prescriptions 

## 2013-05-24 NOTE — ED Notes (Signed)
Report given to Emily, RN.

## 2013-05-26 ENCOUNTER — Encounter (HOSPITAL_COMMUNITY): Payer: Self-pay | Admitting: Emergency Medicine

## 2013-05-26 ENCOUNTER — Emergency Department (HOSPITAL_COMMUNITY)
Admission: EM | Admit: 2013-05-26 | Discharge: 2013-05-26 | Disposition: A | Discharge status: Home / Self Care | Payer: Medicaid Other | Attending: Emergency Medicine | Admitting: Emergency Medicine

## 2013-05-26 DIAGNOSIS — Z79899 Other long term (current) drug therapy: Secondary | ICD-10-CM | POA: Insufficient documentation

## 2013-05-26 DIAGNOSIS — I10 Essential (primary) hypertension: Secondary | ICD-10-CM | POA: Insufficient documentation

## 2013-05-26 DIAGNOSIS — R358 Other polyuria: Secondary | ICD-10-CM | POA: Insufficient documentation

## 2013-05-26 DIAGNOSIS — R3589 Other polyuria: Secondary | ICD-10-CM | POA: Insufficient documentation

## 2013-05-26 DIAGNOSIS — R631 Polydipsia: Secondary | ICD-10-CM | POA: Insufficient documentation

## 2013-05-26 DIAGNOSIS — R739 Hyperglycemia, unspecified: Secondary | ICD-10-CM

## 2013-05-26 DIAGNOSIS — Z87891 Personal history of nicotine dependence: Secondary | ICD-10-CM | POA: Insufficient documentation

## 2013-05-26 DIAGNOSIS — E7800 Pure hypercholesterolemia, unspecified: Secondary | ICD-10-CM | POA: Insufficient documentation

## 2013-05-26 DIAGNOSIS — E119 Type 2 diabetes mellitus without complications: Secondary | ICD-10-CM | POA: Insufficient documentation

## 2013-05-26 LAB — COMPREHENSIVE METABOLIC PANEL
ALK PHOS: 119 U/L — AB (ref 39–117)
ALT: 16 U/L (ref 0–35)
AST: 12 U/L (ref 0–37)
Albumin: 4 g/dL (ref 3.5–5.2)
BUN: 11 mg/dL (ref 6–23)
CO2: 24 meq/L (ref 19–32)
Calcium: 9.5 mg/dL (ref 8.4–10.5)
Chloride: 92 mEq/L — ABNORMAL LOW (ref 96–112)
Creatinine, Ser: 0.71 mg/dL (ref 0.50–1.10)
GLUCOSE: 403 mg/dL — AB (ref 70–99)
POTASSIUM: 4.3 meq/L (ref 3.7–5.3)
SODIUM: 131 meq/L — AB (ref 137–147)
Total Bilirubin: 0.4 mg/dL (ref 0.3–1.2)
Total Protein: 7.7 g/dL (ref 6.0–8.3)

## 2013-05-26 LAB — URINALYSIS, ROUTINE W REFLEX MICROSCOPIC
Bilirubin Urine: NEGATIVE
Glucose, UA: >1000 mg/dL — AB
HGB URINE DIPSTICK: NEGATIVE
Ketones, ur: NEGATIVE mg/dL
Leukocytes, UA: NEGATIVE
NITRITE: NEGATIVE
Protein, ur: NEGATIVE mg/dL
SPECIFIC GRAVITY, URINE: 1.025 (ref 1.005–1.030)
Urobilinogen, UA: 1 mg/dL (ref 0.0–1.0)
pH: 5.5 (ref 5.0–8.0)

## 2013-05-26 LAB — CBC WITH DIFFERENTIAL/PLATELET
Basophils Absolute: 0 10*3/uL (ref 0.0–0.1)
Basophils Relative: 0 % (ref 0–1)
Eosinophils Absolute: 0.2 10*3/uL (ref 0.0–0.7)
Eosinophils Relative: 2 % (ref 0–5)
HCT: 37.6 % (ref 36.0–46.0)
Hemoglobin: 13.5 g/dL (ref 12.0–15.0)
LYMPHS ABS: 3.9 10*3/uL (ref 0.7–4.0)
Lymphocytes Relative: 44 % (ref 12–46)
MCH: 28.2 pg (ref 26.0–34.0)
MCHC: 35.9 g/dL (ref 30.0–36.0)
MCV: 78.5 fL (ref 78.0–100.0)
Monocytes Absolute: 0.4 10*3/uL (ref 0.1–1.0)
Monocytes Relative: 4 % (ref 3–12)
NEUTROS ABS: 4.5 10*3/uL (ref 1.7–7.7)
NEUTROS PCT: 50 % (ref 43–77)
PLATELETS: 206 10*3/uL (ref 150–400)
RBC: 4.79 MIL/uL (ref 3.87–5.11)
RDW: 13.2 % (ref 11.5–15.5)
WBC: 9 10*3/uL (ref 4.0–10.5)

## 2013-05-26 LAB — GLUCOSE, CAPILLARY
GLUCOSE-CAPILLARY: 255 mg/dL — AB (ref 70–99)
Glucose-Capillary: 447 mg/dL — ABNORMAL HIGH (ref 70–99)
Glucose-Capillary: 365 mg/dL — ABNORMAL HIGH (ref 70–99)

## 2013-05-26 LAB — BASIC METABOLIC PANEL
BUN: 10 mg/dL (ref 6–23)
CALCIUM: 8.8 mg/dL (ref 8.4–10.5)
CHLORIDE: 97 meq/L (ref 96–112)
CO2: 24 meq/L (ref 19–32)
Creatinine, Ser: 0.58 mg/dL (ref 0.50–1.10)
GFR calc Af Amer: >90 mL/min (ref >90)
GFR calc non Af Amer: >90 mL/min (ref >90)
GLUCOSE: 331 mg/dL — AB (ref 70–99)
Potassium: 3.8 mEq/L (ref 3.7–5.3)
SODIUM: 135 meq/L — AB (ref 137–147)

## 2013-05-26 LAB — URINE MICROSCOPIC-ADD ON

## 2013-05-26 MED ORDER — SODIUM CHLORIDE 0.9 % IV BOLUS (SEPSIS)
1000.0000 mL | Freq: Once | INTRAVENOUS | Status: AC
  Administered 2013-05-26: 1000 mL via INTRAVENOUS

## 2013-05-26 MED ORDER — METFORMIN HCL 500 MG PO TABS: ORAL_TABLET | ORAL | Status: DC

## 2013-05-26 NOTE — ED Notes (Signed)
Pt reports she has not felt good all week, seen here Wednesday had a CBG of 830, pt discharged home, pt's blood sugar has continued to fluctuate since Wednesday. Pt does reports feeling weak, tired, head swimming, dry mouth, increase thirst and increase urination. Pt is taking her prescribed Metformin, pt was sick with allergy symptoms 05/18/2013 but denies nausea, vomiting, cough, or fever

## 2013-05-26 NOTE — Discharge Instructions (Signed)
Hyperglycemia °Hyperglycemia occurs when the glucose (sugar) in your blood is too high. Hyperglycemia can happen for many reasons, but it most often happens to people who do not know they have diabetes or are not managing their diabetes properly.  °CAUSES  °Whether you have diabetes or not, there are other causes of hyperglycemia. Hyperglycemia can occur when you have diabetes, but it can also occur in other situations that you might not be as aware of, such as: °Diabetes °· If you have diabetes and are having problems controlling your blood glucose, hyperglycemia could occur because of some of the following reasons: °· Not following your meal plan. °· Not taking your diabetes medications or not taking it properly. °· Exercising less or doing less activity than you normally do. °· Being sick. °Pre-diabetes °· This cannot be ignored. Before people develop Type 2 diabetes, they almost always have "pre-diabetes." This is when your blood glucose levels are higher than normal, but not yet high enough to be diagnosed as diabetes. Research has shown that some long-term damage to the body, especially the heart and circulatory system, may already be occurring during pre-diabetes. If you take action to manage your blood glucose when you have pre-diabetes, you may delay or prevent Type 2 diabetes from developing. °Stress °· If you have diabetes, you may be "diet" controlled or on oral medications or insulin to control your diabetes. However, you may find that your blood glucose is higher than usual in the hospital whether you have diabetes or not. This is often referred to as "stress hyperglycemia." Stress can elevate your blood glucose. This happens because of hormones put out by the body during times of stress. If stress has been the cause of your high blood glucose, it can be followed regularly by your caregiver. That way he/she can make sure your hyperglycemia does not continue to get worse or progress to  diabetes. °Steroids °· Steroids are medications that act on the infection fighting system (immune system) to block inflammation or infection. One side effect can be a rise in blood glucose. Most people can produce enough extra insulin to allow for this rise, but for those who cannot, steroids make blood glucose levels go even higher. It is not unusual for steroid treatments to "uncover" diabetes that is developing. It is not always possible to determine if the hyperglycemia will go away after the steroids are stopped. A special blood test called an A1c is sometimes done to determine if your blood glucose was elevated before the steroids were started. °SYMPTOMS °· Thirsty. °· Frequent urination. °· Dry mouth. °· Blurred vision. °· Tired or fatigue. °· Weakness. °· Sleepy. °· Tingling in feet or leg. °DIAGNOSIS  °Diagnosis is made by monitoring blood glucose in one or all of the following ways: °· A1c test. This is a chemical found in your blood. °· Fingerstick blood glucose monitoring. °· Laboratory results. °TREATMENT  °First, knowing the cause of the hyperglycemia is important before the hyperglycemia can be treated. Treatment may include, but is not be limited to: °· Education. °· Change or adjustment in medications. °· Change or adjustment in meal plan. °· Treatment for an illness, infection, etc. °· More frequent blood glucose monitoring. °· Change in exercise plan. °· Decreasing or stopping steroids. °· Lifestyle changes. °HOME CARE INSTRUCTIONS  °· Test your blood glucose as directed. °· Exercise regularly. Your caregiver will give you instructions about exercise. Pre-diabetes or diabetes which comes on with stress is helped by exercising. °· Eat wholesome,   balanced meals. Eat often and at regular, fixed times. Your caregiver or nutritionist will give you a meal plan to guide your sugar intake.  Being at an ideal weight is important. If needed, losing as little as 10 to 15 pounds may help improve blood  glucose levels. SEEK MEDICAL CARE IF:   You have questions about medicine, activity, or diet.  You continue to have symptoms (problems such as increased thirst, urination, or weight gain). SEEK IMMEDIATE MEDICAL CARE IF:   You are vomiting or have diarrhea.  Your breath smells fruity.  You are breathing faster or slower.  You are very sleepy or incoherent.  You have numbness, tingling, or pain in your feet or hands.  You have chest pain.  Your symptoms get worse even though you have been following your caregiver's orders.  If you have any other questions or concerns. Document Released: 10/28/2000 Document Revised: 07/27/2011 Document Reviewed: 08/31/2011 [facility] Patient Information 2014 [place], [place]. Diabetes Meal Planning Guide The diabetes meal planning guide is a tool to help you plan your meals and snacks. It is important for people with diabetes to manage their blood glucose (sugar) levels. Choosing the right foods and the right amounts throughout your day will help control your blood glucose. Eating right can even help you improve your blood pressure and reach or maintain a healthy weight. CARBOHYDRATE COUNTING MADE EASY When you eat carbohydrates, they turn to sugar. This raises your blood glucose level. Counting carbohydrates can help you control this level so you feel better. When you plan your meals by counting carbohydrates, you can have more flexibility in what you eat and balance your medicine with your food intake. Carbohydrate counting simply means adding up the total amount of carbohydrate grams in your meals and snacks. Try to eat about the same amount at each meal. Foods with carbohydrates are listed below. Each portion below is 1 carbohydrate serving or 15 grams of carbohydrates. Ask your dietician how many grams of carbohydrates you should eat at each meal or snack. Grains and Starches  1 slice bread.   English muffin or hotdog/hamburger bun.   cup cold  cereal (unsweetened).   cup cooked pasta or rice.   cup starchy vegetables (corn, potatoes, peas, beans, winter squash).  1 tortilla (6 inches).   bagel.  1 waffle or pancake (size of a CD).   cup cooked cereal.  4 to 6 small crackers. *Whole grain is recommended. Fruit  1 cup fresh unsweetened berries, melon, papaya, pineapple.  1 small fresh fruit.   banana or mango.   cup fruit juice (4 oz unsweetened).   cup canned fruit in natural juice or water.  2 tbs dried fruit.  12 to 15 grapes or cherries. Milk and Yogurt  1 cup fat-free or 1% milk.  1 cup soy milk.  6 oz light yogurt with sugar-free sweetener.  6 oz low-fat soy yogurt.  6 oz plain yogurt. Vegetables  1 cup raw or  cup cooked is counted as 0 carbohydrates or a "free" food.  If you eat 3 or more servings at 1 meal, count them as 1 carbohydrate serving. Other Carbohydrates   oz chips or pretzels.   cup ice cream or frozen yogurt.   cup sherbet or sorbet.  2 inch square cake, no frosting.  1 tbs honey, sugar, jam, jelly, or syrup.  2 small cookies.  3 squares of graham crackers.  3 cups popcorn.  6 crackers.  1 cup broth-based soup.  Count 1 cup casserole or other mixed foods as 2 carbohydrate servings.  Foods with less than 20 calories in a serving may be counted as 0 carbohydrates or a "free" food. You may want to purchase a book or computer software that lists the carbohydrate gram counts of different foods. In addition, the nutrition facts panel on the labels of the foods you eat are a good source of this information. The label will tell you how big the serving size is and the total number of carbohydrate grams you will be eating per serving. Divide this number by 15 to obtain the number of carbohydrate servings in a portion. Remember, 1 carbohydrate serving equals 15 grams of carbohydrate. SERVING SIZES Measuring foods and serving sizes helps you make sure you are  getting the right amount of food. The list below tells how big or small some common serving sizes are.  1 oz.........4 stacked dice.  3 oz........Marland KitchenDeck of cards.  1 tsp.......Marland KitchenTip of little finger.  1 tbs......Marland KitchenMarland KitchenThumb.  2 tbs.......Marland KitchenGolf ball.   cup......Marland KitchenHalf of a fist.  1 cup.......Marland KitchenA fist. SAMPLE DIABETES MEAL PLAN Below is a sample meal plan that includes foods from the grain and starches, dairy, vegetable, fruit, and meat groups. A dietician can individualize a meal plan to fit your calorie needs and tell you the number of servings needed from each food group. However, controlling the total amount of carbohydrates in your meal or snack is more important than making sure you include all of the food groups at every meal. You may interchange carbohydrate containing foods (dairy, starches, and fruits). The meal plan below is an example of a 2000 calorie diet using carbohydrate counting. This meal plan has 17 carbohydrate servings. Breakfast  1 cup oatmeal (2 carb servings).   cup light yogurt (1 carb serving).  1 cup blueberries (1 carb serving).   cup almonds. Snack  1 large apple (2 carb servings).  1 low-fat string cheese stick. Lunch  Chicken breast salad.  1 cup spinach.   cup chopped tomatoes.  2 oz chicken breast, sliced.  2 tbs low-fat [place] dressing.  12 whole-wheat crackers (2 carb servings).  12 to 15 grapes (1 carb serving).  1 cup low-fat milk (1 carb serving). Snack  1 cup carrots.   cup hummus (1 carb serving). Dinner  3 oz broiled salmon.  1 cup brown rice (3 carb servings). Snack  1  cups steamed broccoli (1 carb serving) drizzled with 1 tsp olive oil and lemon juice.  1 cup light pudding (2 carb servings). DIABETES MEAL PLANNING WORKSHEET Your dietician can use this worksheet to help you decide how many servings of foods and what types of foods are right for you.  BREAKFAST Food Group and Servings / Carb  Servings Grain/Starches __________________________________ Dairy __________________________________________ Vegetable ______________________________________ Fruit ___________________________________________ Meat __________________________________________ Fat ____________________________________________ LUNCH Food Group and Servings / Carb Servings Grain/Starches ___________________________________ Dairy ___________________________________________ Fruit ____________________________________________ Meat ___________________________________________ Fat _____________________________________________ Laural Golden Food Group and Servings / Carb Servings Grain/Starches ___________________________________ Dairy ___________________________________________ Fruit ____________________________________________ Meat ___________________________________________ Fat _____________________________________________ SNACKS Food Group and Servings / Carb Servings Grain/Starches ___________________________________ Dairy ___________________________________________ Vegetable _______________________________________ Fruit ____________________________________________ Meat ___________________________________________ Fat _____________________________________________ DAILY TOTALS Starches _________________________ Vegetable ________________________ Fruit ____________________________ Dairy ____________________________ Meat ____________________________ Fat ______________________________ Document Released: 01/29/2005 Document Revised: 07/27/2011 Document Reviewed: 12/10/2008 [employer] Patient Information 2014 [place].

## 2013-05-26 NOTE — ED Notes (Signed)
Pt. States not feeling well for past week. Reports blood sugar continues to be elevated even while taking regular Metformin. Pt. States "swimmy headed". Alert and oriented x4

## 2013-05-26 NOTE — ED Provider Notes (Signed)
CSN: [id number]     Arrival date & time 05/26/13  1711 History   First MD Initiated Contact with Patient 05/26/13 2120     Chief Complaint  Patient presents with  . Hyperglycemia   (Consider location/radiation/quality/duration/timing/severity/associated sxs/prior Treatment) HPI  This a 53 year old female with a history of diabetes, hypertension, hypercholesterolemia who presents with hyperglycemia. Patient reports she was seen 2 days ago for the same. She reports that she is on metformin and has been taking her medication as directed. She does endorse noncompliant with a low carbohydrate diet.  Patient continues to have blood sugars greater than 500 home. She reports feeling "swimmy headed", increased thirst, increased urination. She denies any chest pain, abdominal pain, vomiting, diarrhea.  Patient states that she was called by the old nurse today and told to come in if her blood sugars were greater than 500.  Past Medical History  Diagnosis Date  . Diabetes mellitus without complication   . Hypertension   . Hypercholesteremia    History reviewed. No pertinent past surgical history. History reviewed. No pertinent family history. History  Substance Use Topics  . Smoking status: Former Games developer  . Smokeless tobacco: Not on file  . Alcohol Use: No   OB History   Grav Para Term Preterm Abortions TAB SAB Ect Mult Living                 Review of Systems  Constitutional: Negative for fever.  Respiratory: Negative for cough, chest tightness and shortness of breath.   Cardiovascular: Negative for chest pain.  Gastrointestinal: Negative for nausea, vomiting and abdominal pain.  Endocrine: Positive for polydipsia and polyuria.  Genitourinary: Negative for dysuria.  Musculoskeletal: Negative for back pain.  Skin: Negative for wound.  Neurological: Negative for headaches.  Psychiatric/Behavioral: Negative for confusion.  All other systems reviewed and are negative.    Allergies   Review of patient's allergies indicates no known allergies.  Home Medications   Current Outpatient Rx  Name  Route  Sig  Dispense  Refill  . amLODipine (NORVASC) 5 MG tablet   Oral   Take 1 tablet (5 mg total) by mouth daily.   30 tablet   3   . atorvastatin (LIPITOR) 40 MG tablet   Oral   Take 1 tablet (40 mg total) by mouth daily.   30 tablet   0   . hydrochlorothiazide (MICROZIDE) 12.5 MG capsule   Oral   Take 1 capsule (12.5 mg total) by mouth daily.   30 capsule   3   . metoprolol succinate (TOPROL-XL) 50 MG 24 hr tablet   Oral   Take 1 tablet (50 mg total) by mouth daily. Take with or immediately following a meal.   30 tablet   3   . metFORMIN (GLUCOPHAGE) 500 MG tablet      Take 2 tablets (1000 mg) by mouth in the morning and 1 table (500 mg) by mouth in the evening.   30 tablet   0    BP 158/101  Pulse 72  Temp(Src) 97.9 F (36.6 C) (Oral)  Resp 16  Wt 273 lb 3 oz (123.917 kg)  SpO2 98% Physical Exam  Nursing note and vitals reviewed. Constitutional: She is oriented to person, place, and time. She appears well-developed and well-nourished. No distress.  HENT:  Head: Normocephalic and atraumatic.  Mucous membranes dry  Eyes: Pupils are equal, round, and reactive to light.  Neck: Neck supple.  Cardiovascular: Normal rate, regular rhythm  and normal heart sounds.   Pulmonary/Chest: Effort normal and breath sounds normal. No respiratory distress. She has no wheezes.  Abdominal: Soft. Bowel sounds are normal. There is no tenderness. There is no rebound.  Musculoskeletal: She exhibits no edema.  Neurological: She is alert and oriented to person, place, and time.  Skin: Skin is warm and dry.  Psychiatric: She has a normal mood and affect.    ED Course  Procedures (including critical care time) Labs Review Labs Reviewed  GLUCOSE, CAPILLARY - Abnormal; Notable for the following:    Glucose-Capillary 447 (*)    All other components within normal  limits  URINALYSIS, ROUTINE W REFLEX MICROSCOPIC - Abnormal; Notable for the following:    Glucose, UA >1000 (*)    All other components within normal limits  COMPREHENSIVE METABOLIC PANEL - Abnormal; Notable for the following:    Sodium 131 (*)    Chloride 92 (*)    Glucose, Bld 403 (*)    Alkaline Phosphatase 119 (*)    All other components within normal limits  GLUCOSE, CAPILLARY - Abnormal; Notable for the following:    Glucose-Capillary 365 (*)    All other components within normal limits  BASIC METABOLIC PANEL - Abnormal; Notable for the following:    Sodium 135 (*)    Glucose, Bld 331 (*)    All other components within normal limits  CBC WITH DIFFERENTIAL  URINE MICROSCOPIC-ADD ON   Imaging Review No results found.  EKG Interpretation   None       MDM   1. Hyperglycemia without ketosis    Patient presents with hyperglycemia. She has no other complaints and came back in because she was told to come back and if her blood glucose was greater than 450. She has no evidence of ketosis on her urine. Anion gap initially 15. She admits to noncompliance with her diabetic diet. She is currently only metformin. Patient was given normal normal saline bolus. Repeat BMP is reassuring. I discussed with the patient that she may need to start additional hypoglycemic oral agents versus insulin. We do not do this the ER. However, I will increase her morning dose of metformin to thousand milligrams. Patient stated understanding. She has followup on Monday with her primary physician. She will continue to check her glucoses at home. She was given strict return precautions.  After history, exam, and medical workup I feel the patient has been appropriately medically screened and is safe for discharge home. Pertinent diagnoses were discussed with the patient. Patient was given return precautions.     Shon Baton, MD 05/26/13 2325

## 2013-05-29 ENCOUNTER — Ambulatory Visit (INDEPENDENT_AMBULATORY_CARE_PROVIDER_SITE_OTHER): Payer: Medicaid Other | Admitting: Cardiovascular Disease

## 2013-05-29 ENCOUNTER — Encounter: Payer: Self-pay | Admitting: Cardiovascular Disease

## 2013-05-29 VITALS — BP 150/90 | HR 71 | Ht 69.0 in | Wt 268.0 lb

## 2013-05-29 DIAGNOSIS — I10 Essential (primary) hypertension: Secondary | ICD-10-CM

## 2013-05-29 DIAGNOSIS — T50905A Adverse effect of unspecified drugs, medicaments and biological substances, initial encounter: Secondary | ICD-10-CM

## 2013-05-29 DIAGNOSIS — E139 Other specified diabetes mellitus without complications: Secondary | ICD-10-CM

## 2013-05-29 DIAGNOSIS — E782 Mixed hyperlipidemia: Secondary | ICD-10-CM

## 2013-05-29 DIAGNOSIS — E099 Drug or chemical induced diabetes mellitus without complications: Secondary | ICD-10-CM

## 2013-05-29 DIAGNOSIS — E119 Type 2 diabetes mellitus without complications: Secondary | ICD-10-CM

## 2013-05-29 DIAGNOSIS — I119 Hypertensive heart disease without heart failure: Secondary | ICD-10-CM

## 2013-05-29 DIAGNOSIS — T39395A Adverse effect of other nonsteroidal anti-inflammatory drugs [NSAID], initial encounter: Secondary | ICD-10-CM

## 2013-05-29 DIAGNOSIS — E785 Hyperlipidemia, unspecified: Secondary | ICD-10-CM

## 2013-05-29 MED ORDER — METFORMIN HCL 1000 MG PO TABS: 1000.0000 mg | ORAL_TABLET | Freq: Two times a day (BID) | ORAL | Status: DC

## 2013-05-29 NOTE — Patient Instructions (Signed)
Your physician has recommended you make the following change in your medication: Increase the metformin to 1000 mg twice a day.  Your physician recommends that you return for lab work fasting.  Your physician has requested that you have an echocardiogram. Echocardiography is a painless test that uses sound waves to create images of your heart. It provides your doctor with information about the size and shape of your heart and how well your heart's chambers and valves are working. This procedure takes approximately one hour. There are no restrictions for this procedure.  Your physician recommends that you schedule a follow-up appointment in: 3 months.

## 2013-06-07 ENCOUNTER — Ambulatory Visit (HOSPITAL_COMMUNITY): Payer: Medicaid Other

## 2013-06-21 ENCOUNTER — Encounter: Payer: Self-pay | Admitting: Cardiovascular Disease

## 2013-06-21 ENCOUNTER — Ambulatory Visit (HOSPITAL_COMMUNITY)
Admission: RE | Admit: 2013-06-21 | Discharge: 2013-06-21 | Disposition: A | Discharge status: Home / Self Care | Payer: Medicaid Other | Source: Ambulatory Visit | Attending: Cardiovascular Disease | Admitting: Cardiovascular Disease

## 2013-06-21 DIAGNOSIS — I059 Rheumatic mitral valve disease, unspecified: Secondary | ICD-10-CM | POA: Insufficient documentation

## 2013-06-21 DIAGNOSIS — T50905A Adverse effect of unspecified drugs, medicaments and biological substances, initial encounter: Secondary | ICD-10-CM

## 2013-06-21 DIAGNOSIS — E099 Drug or chemical induced diabetes mellitus without complications: Secondary | ICD-10-CM | POA: Insufficient documentation

## 2013-06-21 DIAGNOSIS — I10 Essential (primary) hypertension: Secondary | ICD-10-CM | POA: Insufficient documentation

## 2013-06-21 DIAGNOSIS — I079 Rheumatic tricuspid valve disease, unspecified: Secondary | ICD-10-CM | POA: Insufficient documentation

## 2013-06-21 DIAGNOSIS — I119 Hypertensive heart disease without heart failure: Secondary | ICD-10-CM

## 2013-06-21 DIAGNOSIS — E782 Mixed hyperlipidemia: Secondary | ICD-10-CM | POA: Insufficient documentation

## 2013-06-21 DIAGNOSIS — T39395A Adverse effect of other nonsteroidal anti-inflammatory drugs [NSAID], initial encounter: Secondary | ICD-10-CM | POA: Insufficient documentation

## 2013-06-21 NOTE — Progress Notes (Signed)
Patient ID: Tanya Owens, female   DOB: June 05, 1960, 53 y.o.   MRN: [id number]     HPI: [name] is a 53 y.o. female presents for one-year cardiology evaluation. I last saw her November 2013.  [name] is a 53 year old African American female who has a history previously documented stage II hypertension. Is also a history of marked hyperlipidemia with marked elevation of triglycerides and very low HDL levels. She does have diabetes mellitus. She hasn't been demonstrated to have significant increase in LDL particle numbers at 1926 in the past.  Over the past year she has felt relatively well but unfortunately she has gained approximately 15 pounds. She stopped smoking approximately 5 months ago. Apparently, recently she was evaluated in the emergency room with hyperglycemia. Laboratory on 05/26/2013 showed a glucose of 331 blood on 05/24/2013 it had been as high as 432. She had normal renal function. She was spilling glucose in her urine. The normal liver function studies with the exception of minimal elevation of the alkaline phosphatase at 119.  According to her mid sheet she is on amlodipine at 5 mg atorvastatin 40 mg HCTZ 12.5 mg and Glucophage 1000 mg in the morning and 500 mg in the evening. She also is on Toprol XL 150 mg. However, she is not exactly sure what she is taking. She presents for evaluation.  She denies recent chest pain or shortness of breath.  Past Medical History  Diagnosis Date  . Diabetes mellitus without complication   . Hypertension   . Hypercholesteremia     History reviewed. No pertinent past surgical history.  No Known Allergies  Current Outpatient Prescriptions  Medication Sig Dispense Refill  . amLODipine (NORVASC) 5 MG tablet Take 1 tablet (5 mg total) by mouth daily.  30 tablet  3  . atorvastatin (LIPITOR) 40 MG tablet Take 1 tablet (40 mg total) by mouth daily.  30 tablet  0  . hydrochlorothiazide (MICROZIDE) 12.5 MG capsule Take 1 capsule  (12.5 mg total) by mouth daily.  30 capsule  3  . metoprolol succinate (TOPROL-XL) 50 MG 24 hr tablet Take 1 tablet (50 mg total) by mouth daily. Take with or immediately following a meal.  30 tablet  3  . metFORMIN (GLUCOPHAGE) 1000 MG tablet Take 1 tablet (1,000 mg total) by mouth 2 (two) times daily with a meal.  60 tablet  11   No current facility-administered medications for this visit.    History   Social History  . Marital Status: Single    Spouse Name: N/A    Number of Children: N/A  . Years of Education: N/A   Occupational History  . Not on file.   Social History Main Topics  . Smoking status: Former Smoker    Quit date: 12/27/2012  . Smokeless tobacco: Never Used  . Alcohol Use: No  . Drug Use: No  . Sexual Activity: Yes    Birth Control/ Protection: Post-menopausal   Other Topics Concern  . Not on file   Social History Narrative  . No narrative on file   Socially she is single has 5 children 4 grandchildren. She does not routinely exercise. There is a tobacco history. There is no alcohol use.  History reviewed. No pertinent family history.  ROS is negative for fevers, chills or night sweats. She denies change in vision or hearing. She denies lymphadenopathy. She denies chest pressure. She denies palpitations. She denies abdominal pain nausea vomiting. Endocrine systems is positive for polydipsia  and polyuria. Genitourinary there is negative for dysuria. She denies significant leg swelling. She denies claudication. She denies tremors.  She denies significant musculoskeletal issues. Other comprehensive 14 point system review is negative.  PE BP 150/90  Pulse 71  Ht 5\' 9"  (1.753 m)  Wt 268 lb (121.564 kg)  BMI 39.56 kg/m2  General: Alert, oriented, no distress.  Skin: normal turgor, no rashes HEENT: Normocephalic, atraumatic. Pupils round and reactive; sclera anicteric;no lid lag. Extraocular muscles intact. Nose without nasal septal hypertrophy Mouth/Parynx  benign; Mallinpatti scale 3 Neck: No JVD, no carotid bruits; normal carotid upstroke Lungs: clear to ausculatation and percussion; no wheezing or rales Chest wall: no tenderness to palpitation Heart: RRR, s1 s2 normal 1/6 systolic murmur Abdomen: Moderate central adiposity soft, nontender; no hepatosplenomehaly, BS+; abdominal aorta nontender and not dilated by palpation. Back: no CVA tenderness Pulses 2+ Extremities: no clubbing cyanosis or edema, Homan's sign negative  Neurologic: grossly nonfocal; cranial nerves grossly normal. Psychologic: normal affect and mood.  ECG (independently read by me): Normal sinus rhythm at 71 beats per minute. Nonspecific ST-T changes.  LABS:  BMET    Component Value Date/Time   NA 135* 05/26/2013 2227   K 3.8 05/26/2013 2227   CL 97 05/26/2013 2227   CO2 24 05/26/2013 2227   GLUCOSE 331* 05/26/2013 2227   BUN 10 05/26/2013 2227   CREATININE 0.58 05/26/2013 2227   CALCIUM 8.8 05/26/2013 2227   GFRNONAA >90 05/26/2013 2227   GFRAA >90 05/26/2013 2227     Hepatic Function Panel     Component Value Date/Time   PROT 7.7 05/26/2013 1737   ALBUMIN 4.0 05/26/2013 1737   AST 12 05/26/2013 1737   ALT 16 05/26/2013 1737   ALKPHOS 119* 05/26/2013 1737   BILITOT 0.4 05/26/2013 1737     CBC    Component Value Date/Time   WBC 9.0 05/26/2013 1737   RBC 4.79 05/26/2013 1737   HGB 13.5 05/26/2013 1737   HCT 37.6 05/26/2013 1737   PLT 206 05/26/2013 1737   MCV 78.5 05/26/2013 1737   MCH 28.2 05/26/2013 1737   MCHC 35.9 05/26/2013 1737   RDW 13.2 05/26/2013 1737   LYMPHSABS 3.9 05/26/2013 1737   MONOABS 0.4 05/26/2013 1737   EOSABS 0.2 05/26/2013 1737   BASOSABS 0.0 05/26/2013 1737     BNP No results found for this basename: probnp    Lipid Panel     Component Value Date/Time   CHOL  Value: 184        ATP III CLASSIFICATION:  <200     mg/dL   Desirable  07/24/2013  mg/dL   Borderline High  098-119    mg/dL   High >=147 12/17/9560   TRIG 219* 10/18/2007 0539   HDL 29* 10/18/2007 0539   CHOLHDL 6.3  10/18/2007 0539   VLDL 44* 10/18/2007 0539   LDLCALC  Value: 111        Total Cholesterol/HDL:CHD Risk Coronary Heart Disease Risk Table                     Men   Women  1/2 Average Risk   3.4   3.3* 10/18/2007 0539     RADIOLOGY: Ct Head Wo Contrast  05/24/2013   CLINICAL DATA:  Right arm numbness.  Left arm pain.  EXAM: CT HEAD WITHOUT CONTRAST  TECHNIQUE: Contiguous axial images were obtained from the base of the skull through the vertex without intravenous contrast.  COMPARISON:  05/27/2012  FINDINGS: No mass effect, midline shift, or acute intracranial hemorrhage. Ventricular system, extra-axial space, and brain parenchyma are unremarkable. Mastoid air cells and visualized paranasal sinuses are clear. Intact cranium.  IMPRESSION: Negative PET-CT.   Electronically Signed   By: Maryclare Bean M.D.   On: 05/24/2013 16:15      ASSESSMENT AND PLAN: Ms. 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 She recently has had hyperglycemia associated with polyuria and polydipsia. Her blood pressure today is elevated with stage I hypertension. I further titrating her amlodipine to 10 mg daily. I've also recommended further titration of her metformin to 1000 mg twice a day. She is on 812 as that in for lipid lowering therapy. Repeat laboratory will be checked in the fasting state consisting of a complex metabolic panel, CBC, TSH, and  NMR evaluation. I am scheduling her for a 2-D echo Doppler study to further evaluate systolic and diastolic function as well as valvular architecture. I will contact her regarding results and adjustments in medications may be necessary. I will see her back in the office in 3 months a cardiology reassessment and further recommendations will be made at that time.     Lennette Bihari, MD, [facility]  06/21/2013 6:01 PM

## 2013-06-21 NOTE — Progress Notes (Signed)
2D Echo Performed 06/21/2013    Carolan Avedisian, RCS  

## 2013-06-26 ENCOUNTER — Other Ambulatory Visit: Payer: Self-pay | Admitting: *Deleted

## 2013-06-26 MED ORDER — ATORVASTATIN CALCIUM 40 MG PO TABS: 40.0000 mg | ORAL_TABLET | Freq: Every day | ORAL | Status: DC

## 2013-06-26 NOTE — Telephone Encounter (Signed)
Rx was sent to pharmacy electronically. 

## 2013-07-12 ENCOUNTER — Encounter: Payer: Self-pay | Admitting: *Deleted

## 2013-07-28 ENCOUNTER — Other Ambulatory Visit: Payer: Self-pay | Admitting: *Deleted

## 2013-07-28 MED ORDER — METOPROLOL SUCCINATE ER 50 MG PO TB24: 50.0000 mg | ORAL_TABLET | Freq: Every day | ORAL | Status: DC

## 2013-07-28 MED ORDER — AMLODIPINE BESYLATE 5 MG PO TABS: 5.0000 mg | ORAL_TABLET | Freq: Every day | ORAL | Status: DC

## 2013-07-28 MED ORDER — HYDROCHLOROTHIAZIDE 12.5 MG PO CAPS: 12.5000 mg | ORAL_CAPSULE | Freq: Every day | ORAL | Status: DC

## 2013-07-28 NOTE — Telephone Encounter (Signed)
Rx was sent to pharmacy electronically. 

## 2014-04-25 ENCOUNTER — Emergency Department (HOSPITAL_COMMUNITY): Payer: Medicaid Other

## 2014-04-25 ENCOUNTER — Emergency Department (HOSPITAL_COMMUNITY)
Admission: EM | Admit: 2014-04-25 | Discharge: 2014-04-25 | Disposition: A | Discharge status: Home / Self Care | Payer: Medicaid Other | Attending: Emergency Medicine | Admitting: Emergency Medicine

## 2014-04-25 ENCOUNTER — Encounter (HOSPITAL_COMMUNITY): Payer: Self-pay | Admitting: Emergency Medicine

## 2014-04-25 DIAGNOSIS — I10 Essential (primary) hypertension: Secondary | ICD-10-CM | POA: Insufficient documentation

## 2014-04-25 DIAGNOSIS — E099 Drug or chemical induced diabetes mellitus without complications: Secondary | ICD-10-CM | POA: Insufficient documentation

## 2014-04-25 DIAGNOSIS — R0789 Other chest pain: Secondary | ICD-10-CM | POA: Diagnosis not present

## 2014-04-25 DIAGNOSIS — R079 Chest pain, unspecified: Secondary | ICD-10-CM | POA: Diagnosis present

## 2014-04-25 DIAGNOSIS — T39395A Adverse effect of other nonsteroidal anti-inflammatory drugs [NSAID], initial encounter: Secondary | ICD-10-CM | POA: Diagnosis not present

## 2014-04-25 DIAGNOSIS — E780 Pure hypercholesterolemia: Secondary | ICD-10-CM | POA: Diagnosis not present

## 2014-04-25 DIAGNOSIS — E782 Mixed hyperlipidemia: Secondary | ICD-10-CM | POA: Diagnosis not present

## 2014-04-25 DIAGNOSIS — R609 Edema, unspecified: Secondary | ICD-10-CM | POA: Diagnosis not present

## 2014-04-25 DIAGNOSIS — G44229 Chronic tension-type headache, not intractable: Secondary | ICD-10-CM | POA: Insufficient documentation

## 2014-04-25 DIAGNOSIS — T50905A Adverse effect of unspecified drugs, medicaments and biological substances, initial encounter: Secondary | ICD-10-CM

## 2014-04-25 DIAGNOSIS — G44209 Tension-type headache, unspecified, not intractable: Secondary | ICD-10-CM

## 2014-04-25 DIAGNOSIS — Z79899 Other long term (current) drug therapy: Secondary | ICD-10-CM | POA: Diagnosis not present

## 2014-04-25 DIAGNOSIS — R110 Nausea: Secondary | ICD-10-CM | POA: Diagnosis not present

## 2014-04-25 DIAGNOSIS — J3489 Other specified disorders of nose and nasal sinuses: Secondary | ICD-10-CM | POA: Diagnosis not present

## 2014-04-25 DIAGNOSIS — Z87891 Personal history of nicotine dependence: Secondary | ICD-10-CM | POA: Diagnosis not present

## 2014-04-25 LAB — BASIC METABOLIC PANEL
ANION GAP: 16 — AB (ref 5–15)
BUN: 12 mg/dL (ref 6–23)
CHLORIDE: 99 meq/L (ref 96–112)
CO2: 23 mEq/L (ref 19–32)
Calcium: 9.4 mg/dL (ref 8.4–10.5)
Creatinine, Ser: 0.7 mg/dL (ref 0.50–1.10)
GFR calc Af Amer: >90 mL/min (ref >90)
GFR calc non Af Amer: >90 mL/min (ref >90)
Glucose, Bld: 88 mg/dL (ref 70–99)
Potassium: 3.8 mEq/L (ref 3.7–5.3)
Sodium: 138 mEq/L (ref 137–147)

## 2014-04-25 LAB — PRO B NATRIURETIC PEPTIDE: Pro B Natriuretic peptide (BNP): 65.2 pg/mL (ref 0–125)

## 2014-04-25 LAB — I-STAT TROPONIN, ED
TROPONIN I, POC: 0 ng/mL (ref 0.00–0.08)
Troponin i, poc: 0 ng/mL (ref 0.00–0.08)

## 2014-04-25 LAB — CBC
HEMATOCRIT: 36.5 % (ref 36.0–46.0)
HEMOGLOBIN: 12.1 g/dL (ref 12.0–15.0)
MCH: 26.2 pg (ref 26.0–34.0)
MCHC: 33.2 g/dL (ref 30.0–36.0)
MCV: 79.2 fL (ref 78.0–100.0)
PLATELETS: 224 10*3/uL (ref 150–400)
RBC: 4.61 MIL/uL (ref 3.87–5.11)
RDW: 14.6 % (ref 11.5–15.5)
WBC: 8 10*3/uL (ref 4.0–10.5)

## 2014-04-25 MED ORDER — KETOROLAC TROMETHAMINE 10 MG PO TABS
10.0000 mg | ORAL_TABLET | Freq: Once | ORAL | Status: AC
  Administered 2014-04-25: 10 mg via ORAL
  Filled 2014-04-25: qty 1

## 2014-04-25 MED ORDER — ASPIRIN 81 MG PO CHEW
324.0000 mg | CHEWABLE_TABLET | Freq: Once | ORAL | Status: AC
  Administered 2014-04-25: 324 mg via ORAL
  Filled 2014-04-25: qty 4

## 2014-04-25 NOTE — ED Notes (Signed)
Patient's gown was found on the stretcher, she was supposed to be discharged. After order was placed for consult to cardiology. Patient was informed of waiting for consult to cardiology for a follow up for a stress test in the next few days. Patient left before completion of discharge papers. Last time seen, she was alert, oriented x 4, and denied any pain.

## 2014-04-25 NOTE — Discharge Instructions (Signed)
1. Medications: usual home medications 2. Treatment: rest, drink plenty of fluids,  3. Follow Up: The cardiology office will call you on your cell phone tomorrow to set up your outpatient appointment, if you have not heard from them by 10 am please call them at [phone].  Please return immediately to the emergency department for return of her chest pain, shortness of breath, sudden onset headaches or other concerning symptoms

## 2014-04-25 NOTE — ED Notes (Signed)
Pt c/o left sided chest pain x 2 hours. Pt c/o SOB and n/v, headache, lightheadedness and dizziness.

## 2014-04-25 NOTE — ED Provider Notes (Signed)
CSN: [id number]     Arrival date & time 04/25/14  1508 History   First MD Initiated Contact with Patient 04/25/14 1706     Chief Complaint  Patient presents with  . Chest Pain     (Consider location/radiation/quality/duration/timing/severity/associated sxs/prior Treatment) The history is provided by the patient and medical records. No language interpreter was used.     Tanya Owens is a 53 y.o. female  with a hx of NIDDM, HTN, HLD presents to the Emergency Department complaining of headache and chest pain.  Pt reports assococaited sharp headache at the top of her head beginning gradually 2 days ago.  She reports taking 4 ibuprofen yesterday with some relief and when the headache returned today she took 4 more without relief.  Pt reports she does not normally get headaches.  She reports her CBG was 82 this morning.  Pt reports her CP began gradually around 1200 noon while laying down.  She reports she had associated nausea and feeling the need to vomit, but she did not.  Pt reports she has been hospitalized several years ago for CP but "nothing was wrong."  Pt reports her chest has "eased up" but is still rated at a 5/10.  pain is located on the Left side and is without aggravating or alleviating factors.  Pt denies fever, chills, neck pain, neck stiffness, abd pain, vomiting, diarrhea, weakness, dizziness, syncope, dysuria.    Past Medical History  Diagnosis Date  . Diabetes mellitus without complication   . Hypertension   . Hypercholesteremia    History reviewed. No pertinent past surgical history. No family history on file. History  Substance Use Topics  . Smoking status: Former Smoker    Quit date: 12/27/2012  . Smokeless tobacco: Never Used  . Alcohol Use: No   OB History    No data available     Review of Systems  Constitutional: Negative for fever, diaphoresis, appetite change, fatigue and unexpected weight change.  HENT: Negative for mouth sores.   Eyes: Negative for  visual disturbance.  Respiratory: Negative for cough, chest tightness, shortness of breath and wheezing.   Cardiovascular: Positive for chest pain.  Gastrointestinal: Positive for nausea. Negative for vomiting, abdominal pain, diarrhea and constipation.  Endocrine: Negative for polydipsia, polyphagia and polyuria.  Genitourinary: Negative for dysuria, urgency, frequency and hematuria.  Musculoskeletal: Negative for back pain and neck stiffness.  Skin: Negative for rash.  Allergic/Immunologic: Negative for immunocompromised state.  Neurological: Positive for headaches. Negative for syncope and light-headedness.  Hematological: Does not bruise/bleed easily.  Psychiatric/Behavioral: Negative for sleep disturbance. The patient is not nervous/anxious.       Allergies  Review of patient's allergies indicates no known allergies.  Home Medications   Prior to Admission medications   Medication Sig Start Date End Date Taking? Authorizing Provider  amLODipine (NORVASC) 5 MG tablet Take 1 tablet (5 mg total) by mouth daily. 07/28/13  Yes Lennette Bihari, MD  atorvastatin (LIPITOR) 40 MG tablet Take 1 tablet (40 mg total) by mouth daily. 06/26/13  Yes Lennette Bihari, MD  hydrochlorothiazide (MICROZIDE) 12.5 MG capsule Take 1 capsule (12.5 mg total) by mouth daily. 07/28/13  Yes Lennette Bihari, MD  ibuprofen (ADVIL,MOTRIN) 200 MG tablet Take 400 mg by mouth every 6 (six) hours as needed for headache.   Yes Historical Provider, MD  metFORMIN (GLUCOPHAGE) 1000 MG tablet Take 1 tablet (1,000 mg total) by mouth 2 (two) times daily with a meal. 05/29/13  Yes Maisie Fus  Alphonsus Sias, MD  metoprolol succinate (TOPROL-XL) 50 MG 24 hr tablet Take 1 tablet (50 mg total) by mouth daily. Take with or immediately following a meal. 07/28/13  Yes Lennette Bihari, MD   BP 183/95 mmHg  Pulse 62  Temp(Src) 97.8 F (36.6 C) (Oral)  Resp 20  SpO2 100% Physical Exam  Constitutional: She is oriented to person, place, and time. She  appears well-developed and well-nourished. No distress.  Awake, alert, nontoxic appearance  HENT:  Head: Normocephalic and atraumatic.  Right Ear: Tympanic membrane, external ear and ear canal normal.  Left Ear: Tympanic membrane, external ear and ear canal normal.  Nose: Mucosal edema and rhinorrhea present. No epistaxis. Right sinus exhibits no maxillary sinus tenderness and no frontal sinus tenderness. Left sinus exhibits no maxillary sinus tenderness and no frontal sinus tenderness.  Mouth/Throat: Uvula is midline, oropharynx is clear and moist and mucous membranes are normal. Mucous membranes are not pale and not cyanotic. No oropharyngeal exudate, posterior oropharyngeal edema, posterior oropharyngeal erythema or tonsillar abscesses.  Eyes: Conjunctivae and EOM are normal. Pupils are equal, round, and reactive to light. No scleral icterus.  No horizontal, vertical or rotational nystagmus  Neck: Normal range of motion and full passive range of motion without pain. Neck supple.  Full active and passive ROM without pain No midline or paraspinal tenderness No nuchal rigidity or meningeal signs  Cardiovascular: Normal rate, regular rhythm and intact distal pulses.   No murmur heard. Pulmonary/Chest: Effort normal and breath sounds normal. No stridor. No respiratory distress. She has no wheezes. She has no rales. She exhibits no tenderness.  Equal chest expansion  Abdominal: Soft. Bowel sounds are normal. She exhibits no distension and no mass. There is no tenderness. There is no rebound and no guarding.  Soft and nontender Obese  Musculoskeletal: Normal range of motion. She exhibits no edema.  Lymphadenopathy:    She has no cervical adenopathy.  Neurological: She is alert and oriented to person, place, and time. She has normal reflexes. No cranial nerve deficit. She exhibits normal muscle tone. Coordination normal.  Mental Status:  Alert, oriented, thought content appropriate. Speech  fluent without evidence of aphasia. Able to follow 2 step commands without difficulty.  Cranial Nerves:  II:  Peripheral visual fields grossly normal, pupils equal, round, reactive to light III,IV, VI: ptosis not present, extra-ocular motions intact bilaterally  V,VII: smile symmetric, facial light touch sensation equal VIII: hearing grossly normal bilaterally  IX,X: gag reflex present  XI: bilateral shoulder shrug equal and strong XII: midline tongue extension  Motor:  5/5 in upper and lower extremities bilaterally including strong and equal grip strength and dorsiflexion/plantar flexion Sensory: Pinprick and light touch normal in all extremities.  Deep Tendon Reflexes: 2+ and symmetric  Cerebellar: normal finger-to-nose with bilateral upper extremities Gait: normal gait and balance CV: distal pulses palpable throughout   Skin: Skin is warm and dry. No rash noted. She is not diaphoretic. No erythema.  Psychiatric: She has a normal mood and affect. Her behavior is normal. Judgment and thought content normal.  Nursing note and vitals reviewed.   ED Course  Procedures (including critical care time) Labs Review Labs Reviewed  BASIC METABOLIC PANEL - Abnormal; Notable for the following:    Anion gap 16 (*)    All other components within normal limits  CBC  PRO B NATRIURETIC PEPTIDE  I-STAT TROPOININ, ED  Rosezena Sensor, ED    Imaging Review Dg Chest 2 View  04/25/2014  CLINICAL DATA:  Left-sided chest pain for 2 hr. Shortness of breath with nausea, vomiting, headache, lightheadedness, and dizziness.  EXAM: CHEST  2 VIEW  COMPARISON:  02/13/2008  FINDINGS: The cardiomediastinal silhouette is within normal limits. The lungs are well inflated and clear. There is no evidence of pleural effusion or pneumothorax. No acute osseous abnormality is identified.  IMPRESSION: No active cardiopulmonary disease.   Electronically Signed   By: Sebastian Ache   On: 04/25/2014 16:39     EKG  Interpretation   Date/Time:  Wednesday April 25 2014 15:18:17 EST Ventricular Rate:  75 PR Interval:  155 QRS Duration: 103 QT Interval:  433 QTC Calculation: 484 R Axis:   5 Text Interpretation:  Sinus rhythm Borderline repolarization abnormality  No significant change since last tracing Confirmed by DOCHERTY  MD, MEGAN  ([id number]) on 04/25/2014 9:12:09 PM       ECHO: 06/21/13 -Left ventricle: The cavity size was normal. Systolic function was normal. The estimated ejection fraction was in the range of 60% to 65%. Wall motion was normal; there were no regional wall motion abnormalities. Left ventricular diastolic function parameters were normal. - Mitral valve: Mild regurgitation. - Left atrium: The atrium was mildly dilated.  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  CP is not exertional and was associated with nausea, but no vomiting, diaphoresis, near syncope, SOB.  Pt also with gradual onset headache at the occiput.  Heart Score 4 - moderate risk.    Labs reassuring; initial troponin negative. No leukocytosis. No hyperglycemia. BNP unremarkable, chest x-ray without cardiomegaly or pneumonia.  Will obtain a delta troponin at 6 hour mark.  Will give ASA and Toradol for headache control.  No neurologic deficits and pt ambulates without difficulty. Presentation is non concerning for [facility], ICH, Meningitis, or temporal arteritis. Pt is afebrile with no focal neuro deficits, nuchal rigidity, or change in vision.   ECG nonischemic without changes from previous on 05/29/13.     7:15PM Repeat troponin is negative obtained 3 hours after first troponin and 6 hours after onset of chest pain. Patient is chest pain-free at this time.  Pt also with  complete resolution of headache.  Will PO trial and consult with cardiology to ensure close follow-up for outpatient stress test.    8:45PM No emesis after PO trial.  No return of CP.  Pt requests d/c home.    9:07 PM Pt discussed with Dr. Gala Romney who will alert the office of pt's need for close follow-up and outpatient stress test.    Patient is to be discharged with recommendation to follow up with PCP in regards to today's hospital visit. Chest pain is not likely of cardiac or pulmonary etiology d/t presentation, PERC negative, VSS, no tracheal deviation, no JVD or new murmur, RRR, breath sounds equal bilaterally, EKG without acute abnormalities, negative troponin, and negative CXR. Pt has been advised to return to the ED if CP becomes exertional, associated with diaphoresis or nausea, radiates to left jaw/arm, worsens or becomes concerning in any way. Pt appears reliable for follow up and is requesting discharge.   The patient was discussed with Dr. Micheline Maze who agrees with the treatment plan; pt eloped from the department before Dr. Micheline Maze was able to physically evaluate the patient.  BP 183/95 mmHg  Pulse 62  Temp(Src) 97.8 F (36.6 C) (Oral)  Resp 20  SpO2 100%   Dierdre Forth, PA-C 04/25/14 2121  Toy Cookey, MD 04/26/14 1650

## 2014-04-28 ENCOUNTER — Other Ambulatory Visit: Payer: Self-pay | Admitting: Physician Assistant

## 2014-04-28 DIAGNOSIS — R079 Chest pain, unspecified: Secondary | ICD-10-CM

## 2014-04-30 ENCOUNTER — Telehealth (HOSPITAL_COMMUNITY): Payer: Self-pay | Admitting: *Deleted

## 2014-05-14 ENCOUNTER — Telehealth (HOSPITAL_COMMUNITY): Payer: Self-pay | Admitting: *Deleted

## 2014-05-16 ENCOUNTER — Encounter: Payer: Self-pay | Admitting: *Deleted

## 2014-05-17 ENCOUNTER — Ambulatory Visit: Payer: Medicaid Other | Admitting: Cardiology

## 2014-05-22 ENCOUNTER — Ambulatory Visit: Payer: Medicaid Other | Admitting: Physician Assistant

## 2014-05-24 ENCOUNTER — Other Ambulatory Visit (HOSPITAL_COMMUNITY): Payer: Self-pay | Admitting: Physician Assistant

## 2014-05-24 DIAGNOSIS — R079 Chest pain, unspecified: Secondary | ICD-10-CM

## 2014-05-25 ENCOUNTER — Telehealth (HOSPITAL_COMMUNITY): Payer: Self-pay

## 2014-05-25 NOTE — Telephone Encounter (Signed)
Encounter complete. 

## 2014-05-29 ENCOUNTER — Telehealth (HOSPITAL_COMMUNITY): Payer: Self-pay

## 2014-05-29 NOTE — Telephone Encounter (Signed)
Encounter complete. 

## 2014-05-30 ENCOUNTER — Encounter (HOSPITAL_COMMUNITY): Payer: Medicaid Other

## 2014-05-31 ENCOUNTER — Telehealth (HOSPITAL_COMMUNITY): Payer: Self-pay

## 2014-05-31 ENCOUNTER — Encounter (HOSPITAL_COMMUNITY): Payer: Medicaid Other

## 2014-05-31 NOTE — Telephone Encounter (Signed)
Encounter complete. 

## 2014-06-05 ENCOUNTER — Ambulatory Visit (INDEPENDENT_AMBULATORY_CARE_PROVIDER_SITE_OTHER): Payer: Medicaid Other | Admitting: Physician Assistant

## 2014-06-05 ENCOUNTER — Ambulatory Visit (HOSPITAL_COMMUNITY)
Admission: RE | Admit: 2014-06-05 | Discharge: 2014-06-05 | Disposition: A | Discharge status: Home / Self Care | Payer: Medicaid Other | Source: Ambulatory Visit | Attending: Cardiology | Admitting: Cardiology

## 2014-06-05 ENCOUNTER — Encounter: Payer: Self-pay | Admitting: Physician Assistant

## 2014-06-05 VITALS — BP 150/92 | HR 68 | Ht 69.0 in | Wt 262.2 lb

## 2014-06-05 DIAGNOSIS — I10 Essential (primary) hypertension: Secondary | ICD-10-CM | POA: Diagnosis not present

## 2014-06-05 DIAGNOSIS — R079 Chest pain, unspecified: Secondary | ICD-10-CM | POA: Insufficient documentation

## 2014-06-05 DIAGNOSIS — E785 Hyperlipidemia, unspecified: Secondary | ICD-10-CM | POA: Diagnosis not present

## 2014-06-05 DIAGNOSIS — Z8249 Family history of ischemic heart disease and other diseases of the circulatory system: Secondary | ICD-10-CM | POA: Insufficient documentation

## 2014-06-05 DIAGNOSIS — Z87891 Personal history of nicotine dependence: Secondary | ICD-10-CM | POA: Diagnosis not present

## 2014-06-05 DIAGNOSIS — R110 Nausea: Secondary | ICD-10-CM | POA: Diagnosis not present

## 2014-06-05 DIAGNOSIS — E669 Obesity, unspecified: Secondary | ICD-10-CM | POA: Insufficient documentation

## 2014-06-05 DIAGNOSIS — E119 Type 2 diabetes mellitus without complications: Secondary | ICD-10-CM | POA: Insufficient documentation

## 2014-06-05 DIAGNOSIS — R0789 Other chest pain: Secondary | ICD-10-CM

## 2014-06-05 DIAGNOSIS — E782 Mixed hyperlipidemia: Secondary | ICD-10-CM

## 2014-06-05 MED ORDER — AMINOPHYLLINE 25 MG/ML IV SOLN
125.0000 mg | Freq: Once | INTRAVENOUS | Status: AC
  Administered 2014-06-05: 125 mg via INTRAVENOUS

## 2014-06-05 MED ORDER — TECHNETIUM TC 99M SESTAMIBI GENERIC - CARDIOLITE
10.9000 | Freq: Once | INTRAVENOUS | Status: AC | PRN
  Administered 2014-06-05: 10.9 via INTRAVENOUS

## 2014-06-05 MED ORDER — REGADENOSON 0.4 MG/5ML IV SOLN
0.4000 mg | Freq: Once | INTRAVENOUS | Status: AC
  Administered 2014-06-05: 0.4 mg via INTRAVENOUS

## 2014-06-05 MED ORDER — AMLODIPINE BESYLATE 10 MG PO TABS: 10.0000 mg | ORAL_TABLET | Freq: Every day | ORAL | Status: DC

## 2014-06-05 MED ORDER — TECHNETIUM TC 99M SESTAMIBI GENERIC - CARDIOLITE
32.9000 | Freq: Once | INTRAVENOUS | Status: AC | PRN
  Administered 2014-06-05: 32.9 via INTRAVENOUS

## 2014-06-05 NOTE — Procedures (Addendum)
[facility] [address] [place] [address] [place] [address] [phone]  Cardiology Nuclear Med Study  Tanya Owens is a 54 y.o. female     MRN : [id number]     DOB: 1961/05/03  Procedure Date: 06/05/2014  Nuclear Med Background Indication for Stress Test:  Evaluation for Ischemia and Post Hospital History:  No priro cardiac or respiratory history reported; No prior NUC MPI for comparison. Cardiac Risk Factors: Family History - CAD, History of Smoking, Hypertension, Lipids, NIDDM and Obesity  Symptoms:  Chest Pain and Nausea   Nuclear Pre-Procedure Caffeine/Decaff Intake:  1:00am NPO After: 11am   IV Site: R Forearm  IV 0.9% NS with Angio Cath:  22g  Chest Size (in):  n/a IV Started by: Berdie Ogren, RN  Height: 5\' 9"  (1.753 m)  Cup Size: D  BMI:  Body mass index is 39.56 kg/(m^2). Weight:  268 lb (121.564 kg)   Tech Comments:  n/a    Nuclear Med Study 1 or 2 day study: 1 day  Stress Test Type:  Lexiscan  Order Authorizing Provider:  , MD   Resting Radionuclide: Technetium 27m Sestamibi  Resting Radionuclide Dose: 10.9 mCi   Stress Radionuclide:  Technetium 91m Sestamibi  Stress Radionuclide Dose: 32.9 mCi           Stress Protocol Rest HR: 70 Stress HR: 111  Rest BP: 149/102 Stress BP: 163/98  Exercise Time (min): n/a METS: n/a          Dose of Adenosine (mg):  n/a Dose of Lexiscan: 0.4 mg  Dose of Atropine (mg): n/a Dose of Dobutamine: n/a mcg/kg/min (at max HR)  Stress Test Technologist: 84m, CCT Nuclear Technologist: Ernestene Mention, CNMT   Rest Procedure:  Myocardial perfusion imaging was performed at rest 45 minutes following the intravenous administration of Technetium 19m Sestamibi. Stress Procedure:  The patient received IV Lexiscan 0.4 mg over 15-seconds.  Technetium 15m Sestamibi injected IV at 30-seconds.  Patient experienced shortness of breath and was administered 125 mg of  Aminophylline IV.  There were no significant changes with Lexiscan.  Quantitative spect images were obtained after a 45 minute delay.  Transient Ischemic Dilatation (Normal <1.22):  1.56 QGS EDV:  135 ml QGS ESV:  63 ml LV Ejection Fraction: 53%  Rest ECG: NSR with non-specific ST-T wave changes  Stress ECG: No significant change from baseline ECG  QPS Raw Data Images:  Normal; no motion artifact; normal heart/lung ratio. Stress Images:  Normal homogeneous uptake in all areas of the myocardium. Rest Images:  Normal homogeneous uptake in all areas of the myocardium. Subtraction (SDS):  No evidence of ischemia.  Impression Exercise Capacity:  Lexiscan with no exercise. BP Response:  Normal blood pressure response. Clinical Symptoms:  No significant symptoms noted. ECG Impression:  No significant ECG changes with Lexiscan. Comparison with Prior Nuclear Study: No previous nuclear study performed  Overall Impression:  Normal stress nuclear study.  LV Wall Motion:  NL LV Function; NL Wall Motion; LVEF 53%.  84m, MD, [facility] Board Certified in Nuclear Cardiology Attending Cardiologist [facility]  [facility], MD  06/05/2014 5:52 PM

## 2014-06-05 NOTE — Assessment & Plan Note (Signed)
Patient sounds like she's made considerable changes in the way she eats. She been cutting out carbohydrates and sounds as though she is quite serious about losing weight.  She was provided with additional information and resources.  Discussed adding exercise to her routine.

## 2014-06-05 NOTE — Assessment & Plan Note (Signed)
Continue statin. 

## 2014-06-05 NOTE — Patient Instructions (Signed)
Your physician recommends that you schedule a follow-up appointment in:3 months with Dr.Kelly.  We will call you with your Stress Test Results.

## 2014-06-05 NOTE — Assessment & Plan Note (Signed)
BP not well controlled.  Increase the pain to 10 mg daily. Follow-up in 3 months with Dr. Tresa Endo

## 2014-06-05 NOTE — Progress Notes (Signed)
Patient ID: Tanya Owens, female   DOB: 16-Aug-1960, 54 y.o.   MRN: [id number]    Date:  06/05/2014   ID:  Tanya Owens, DOB 1960-07-14, MRN [id number]  PCP:  No PCP Per Patient  Primary Cardiologist:  Tresa Endo   Chief complaint: Follow-up post ER visit    History of Present Illness: Tanya Owens is a 54 y.o. obese female who has a history previously documented stage II hypertension, tobacco abuse. Is also a history of marked hyperlipidemia with marked elevation of triglycerides and very low HDL levels. She does have diabetes mellitus. She has demonstrated to have significant increase in LDL particle numbers at 1926 in the past.  Patient was seen in urgency room on December 9 after having chest pain and headache. Was recommended that she follow-up for a nuclear stress test. This was completed today. Patient reports she hasn't had any further chest pain since then episode but she's had some periodic headaches.  She says that she serious about losing weight and has cut out a lot of carbohydrates in her diet. She no longer eats pasta bread or sweet tea.  Compared to one year ago she's down 11 pounds.  She says her blood sugars and been running really well 60-100 range.  The patient currently denies nausea, vomiting, fever, chest pain, shortness of breath, orthopnea, dizziness, PND, cough, congestion, abdominal pain, hematochezia, melena, lower extremity edema, claudication.    Wt Readings from Last 3 Encounters:  06/05/14 262 lb 3.2 oz (118.933 kg)  05/29/13 268 lb (121.564 kg)  05/26/13 273 lb 3 oz (123.917 kg)     Past Medical History  Diagnosis Date  . Diabetes mellitus without complication   . Hypertension   . Hypercholesteremia     Current Outpatient Prescriptions  Medication Sig Dispense Refill  . atorvastatin (LIPITOR) 40 MG tablet Take 1 tablet (40 mg total) by mouth daily. 30 tablet 11  . hydrochlorothiazide (MICROZIDE) 12.5 MG capsule Take 1 capsule (12.5 mg total) by  mouth daily. (Patient taking differently: Take 12.5 mg by mouth as needed. ) 30 capsule 10  . ibuprofen (ADVIL,MOTRIN) 200 MG tablet Take 400 mg by mouth every 6 (six) hours as needed for headache.    . metFORMIN (GLUCOPHAGE) 1000 MG tablet Take 1 tablet (1,000 mg total) by mouth 2 (two) times daily with a meal. 60 tablet 11  . metoprolol succinate (TOPROL-XL) 50 MG 24 hr tablet Take 1 tablet (50 mg total) by mouth daily. Take with or immediately following a meal. 30 tablet 10  . amLODipine (NORVASC) 10 MG tablet Take 1 tablet (10 mg total) by mouth daily. 180 tablet 3   No current facility-administered medications for this visit.    Allergies:   No Known Allergies  Social History:  The patient  reports that she quit smoking about 17 months ago. She has never used smokeless tobacco. She reports that she does not drink alcohol or use illicit drugs.   Family history:   Family History  Problem Relation Age of Onset  . Heart attack Mother     ROS:  Please see the history of present illness.  All other systems reviewed and negative.   PHYSICAL EXAM: VS:  BP 150/92 mmHg  Pulse 68  Ht 5\' 9"  (1.753 m)  Wt 262 lb 3.2 oz (118.933 kg)  BMI 38.70 kg/m2 Obese, well developed, in no acute distress HEENT: Pupils are equal round react to light accommodation extraocular movements are intact.  Neck: no  JVDNo cervical lymphadenopathy. Cardiac: Regular rate and rhythm without murmurs rubs or gallops. Lungs:  clear to auscultation bilaterally, no wheezing, rhonchi or rales Abd: soft, nontender, positive bowel sounds all quadrants, no hepatosplenomegaly Ext: no lower extremity edema.  2+ radial and dorsalis pedis pulses. Skin: warm and dry Neuro:  Grossly normal    ASSESSMENT AND PLAN:  Problem List Items Addressed This Visit    Morbid obesity    Patient sounds like she's made considerable changes in the way she eats. She been cutting out carbohydrates and sounds as though she is quite serious  about losing weight.  She was provided with additional information and resources.  Discussed adding exercise to her routine.      Mixed hyperlipidemia    Continue statin      Relevant Medications   amLODIpine (NORVASC) tablet   HTN (hypertension)    BP not well controlled.  Increase the pain to 10 mg daily. Follow-up in 3 months with Dr. Tresa Endo      Relevant Medications   amLODIpine (NORVASC) tablet   Chest pain - Primary    Stress test was completed today.  I will follow up results.  She as been pain free since leaving the ER.

## 2014-06-05 NOTE — Assessment & Plan Note (Signed)
Stress test was completed today.  I will follow up results.  She as been pain free since leaving the ER.

## 2014-06-11 ENCOUNTER — Other Ambulatory Visit: Payer: Self-pay

## 2014-06-11 MED ORDER — METFORMIN HCL 1000 MG PO TABS: 1000.0000 mg | ORAL_TABLET | Freq: Two times a day (BID) | ORAL | Status: DC

## 2014-06-11 NOTE — Telephone Encounter (Signed)
Rx sent to pharmacy   

## 2014-07-23 ENCOUNTER — Other Ambulatory Visit: Payer: Self-pay | Admitting: *Deleted

## 2014-07-23 MED ORDER — ATORVASTATIN CALCIUM 40 MG PO TABS: 40.0000 mg | ORAL_TABLET | Freq: Every day | ORAL | Status: DC

## 2014-07-30 ENCOUNTER — Encounter (HOSPITAL_COMMUNITY): Payer: Self-pay | Admitting: Emergency Medicine

## 2014-07-30 ENCOUNTER — Emergency Department (HOSPITAL_COMMUNITY)
Admission: EM | Admit: 2014-07-30 | Discharge: 2014-07-30 | Disposition: A | Discharge status: Home / Self Care | Payer: Medicaid Other | Attending: Emergency Medicine | Admitting: Emergency Medicine

## 2014-07-30 ENCOUNTER — Emergency Department (HOSPITAL_COMMUNITY)
Admission: EM | Admit: 2014-07-30 | Discharge: 2014-07-30 | Discharge status: Absent without leave | Payer: No Typology Code available for payment source

## 2014-07-30 DIAGNOSIS — R110 Nausea: Secondary | ICD-10-CM | POA: Diagnosis not present

## 2014-07-30 DIAGNOSIS — Y92410 Unspecified street and highway as the place of occurrence of the external cause: Secondary | ICD-10-CM | POA: Diagnosis not present

## 2014-07-30 DIAGNOSIS — R519 Headache, unspecified: Secondary | ICD-10-CM

## 2014-07-30 DIAGNOSIS — E119 Type 2 diabetes mellitus without complications: Secondary | ICD-10-CM | POA: Diagnosis not present

## 2014-07-30 DIAGNOSIS — Y939 Activity, unspecified: Secondary | ICD-10-CM | POA: Insufficient documentation

## 2014-07-30 DIAGNOSIS — Y999 Unspecified external cause status: Secondary | ICD-10-CM | POA: Diagnosis not present

## 2014-07-30 DIAGNOSIS — R51 Headache: Secondary | ICD-10-CM

## 2014-07-30 DIAGNOSIS — Z79899 Other long term (current) drug therapy: Secondary | ICD-10-CM | POA: Diagnosis not present

## 2014-07-30 DIAGNOSIS — Z87891 Personal history of nicotine dependence: Secondary | ICD-10-CM | POA: Insufficient documentation

## 2014-07-30 DIAGNOSIS — E780 Pure hypercholesterolemia: Secondary | ICD-10-CM | POA: Diagnosis not present

## 2014-07-30 DIAGNOSIS — I10 Essential (primary) hypertension: Secondary | ICD-10-CM | POA: Diagnosis not present

## 2014-07-30 DIAGNOSIS — S0990XA Unspecified injury of head, initial encounter: Secondary | ICD-10-CM | POA: Insufficient documentation

## 2014-07-30 LAB — CBG MONITORING, ED: GLUCOSE-CAPILLARY: 112 mg/dL — AB (ref 70–99)

## 2014-07-30 MED ORDER — CYCLOBENZAPRINE HCL 10 MG PO TABS
10.0000 mg | ORAL_TABLET | Freq: Once | ORAL | Status: AC
  Administered 2014-07-30: 10 mg via ORAL
  Filled 2014-07-30: qty 1

## 2014-07-30 MED ORDER — CYCLOBENZAPRINE HCL 10 MG PO TABS: 10.0000 mg | ORAL_TABLET | Freq: Three times a day (TID) | ORAL | Status: DC | PRN
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
## 2014-08-15 ENCOUNTER — Other Ambulatory Visit: Payer: Self-pay | Admitting: *Deleted

## 2014-08-15 MED ORDER — METOPROLOL SUCCINATE ER 50 MG PO TB24: 50.0000 mg | ORAL_TABLET | Freq: Every day | ORAL | Status: DC

## 2014-08-15 MED ORDER — HYDROCHLOROTHIAZIDE 12.5 MG PO CAPS: 12.5000 mg | ORAL_CAPSULE | Freq: Every day | ORAL | Status: DC | PRN

## 2014-08-15 NOTE — Telephone Encounter (Signed)
Rx refill sent to patient pharmacy   

## 2014-09-27 ENCOUNTER — Ambulatory Visit: Payer: Medicaid Other | Admitting: Cardiovascular Disease

## 2014-10-01 ENCOUNTER — Telehealth: Payer: Self-pay | Admitting: Cardiovascular Disease

## 2014-10-01 DIAGNOSIS — T39395A Adverse effect of other nonsteroidal anti-inflammatory drugs [NSAID], initial encounter: Secondary | ICD-10-CM

## 2014-10-01 DIAGNOSIS — T50905A Adverse effect of unspecified drugs, medicaments and biological substances, initial encounter: Principal | ICD-10-CM

## 2014-10-01 DIAGNOSIS — E099 Drug or chemical induced diabetes mellitus without complications: Secondary | ICD-10-CM

## 2014-10-01 MED ORDER — METFORMIN HCL 1000 MG PO TABS: 1000.0000 mg | ORAL_TABLET | Freq: Two times a day (BID) | ORAL | Status: DC

## 2014-10-01 NOTE — Telephone Encounter (Signed)
Spoke to patient . RN asked patient if she had a doctor following her diabetes . She states she does not have primary, Medicaid has her  Gynecologist  as primary. RN informed patient that gynecologist can be primary. Patient would like to see an endocrinologist. Will refill medication for a few months until appointment is establish Patient wants Dr Tresa Endo RECOMMEND  DOCTOR . Will contact patient - referral - (PATIENT MAY HAVE TO HAVE GYNECOLOGIST TO MAKE REFERRAL )

## 2014-10-01 NOTE — Telephone Encounter (Signed)
°  1. Which medications need to be refilled? Metformin   2. Which pharmacy is medication to be sent to?[facility] on [address]  3. Do they need a 30 day or 90 day supply? 30 day (BID)  4. Would they like a call back once the medication has been sent to the pharmacy? Yes

## 2014-10-09 NOTE — Telephone Encounter (Signed)
Can this encounter be closed?

## 2014-10-15 NOTE — Telephone Encounter (Signed)
Ok to refer to endocrinologist?

## 2014-10-16 NOTE — Telephone Encounter (Signed)
Can this encounter be closed?

## 2014-10-18 NOTE — Telephone Encounter (Signed)
SPOKE TO  DR Tresa Endo  REFER PATIENT TO DR Lucianne Muss - DIABETIC MANAGEMENT

## 2014-10-18 NOTE — Addendum Note (Signed)
Addended by: Tobin Chad on: 10/18/2014 05:24 PM   Modules accepted: Orders

## 2014-10-29 ENCOUNTER — Telehealth: Payer: Self-pay | Admitting: Cardiovascular Disease

## 2014-10-29 NOTE — Telephone Encounter (Signed)
RN called few office - no one taking  Medicaid Information given to patient that patient contact her case manager and have  GYN removed. Get a list from  Manager of family medicine or internal medicine offices then call an make an appointment for then to be primary ( new patient)  They can follow your  Diabetes. Patient voiced understanding.

## 2014-10-29 NOTE — Telephone Encounter (Signed)
Pt says she needs to be referred to a Diabetic doctor who takes Medicaid. Dr Ahmed Prima not take Medicaid.

## 2014-11-12 ENCOUNTER — Ambulatory Visit: Payer: Medicaid Other | Admitting: Cardiovascular Disease

## 2014-11-27 ENCOUNTER — Ambulatory Visit: Payer: Medicaid Other | Admitting: Endocrinology

## 2014-11-28 ENCOUNTER — Encounter: Payer: Self-pay | Admitting: Cardiovascular Disease

## 2014-11-28 ENCOUNTER — Ambulatory Visit (INDEPENDENT_AMBULATORY_CARE_PROVIDER_SITE_OTHER): Payer: Medicaid Other | Admitting: Cardiovascular Disease

## 2014-11-28 VITALS — BP 140/80 | HR 66 | Ht 69.0 in | Wt 267.3 lb

## 2014-11-28 DIAGNOSIS — E669 Obesity, unspecified: Secondary | ICD-10-CM

## 2014-11-28 DIAGNOSIS — E119 Type 2 diabetes mellitus without complications: Secondary | ICD-10-CM

## 2014-11-28 DIAGNOSIS — I10 Essential (primary) hypertension: Secondary | ICD-10-CM | POA: Diagnosis not present

## 2014-11-28 DIAGNOSIS — E1140 Type 2 diabetes mellitus with diabetic neuropathy, unspecified: Secondary | ICD-10-CM | POA: Insufficient documentation

## 2014-11-28 MED ORDER — METOPROLOL SUCCINATE ER 50 MG PO TB24: 50.0000 mg | ORAL_TABLET | Freq: Every day | ORAL | Status: DC

## 2014-11-28 MED ORDER — AMLODIPINE BESYLATE 10 MG PO TABS: 10.0000 mg | ORAL_TABLET | Freq: Every day | ORAL | Status: DC

## 2014-11-28 MED ORDER — HYDROCHLOROTHIAZIDE 25 MG PO TABS: 25.0000 mg | ORAL_TABLET | Freq: Every day | ORAL | Status: DC

## 2014-11-28 MED ORDER — ATORVASTATIN CALCIUM 40 MG PO TABS: 40.0000 mg | ORAL_TABLET | Freq: Every day | ORAL | Status: DC

## 2014-11-28 NOTE — Patient Instructions (Signed)
Your physician has recommended you make the following change in your medication: the hydorchlorthiazide has been increased to 25 mg daily. A new prescription has been sent to your pharmacy to reflect this change.  Your physician wants you to follow-up in: 1 year or sooner if needed. You will receive a reminder letter in the mail two months in advance. If you don't receive a letter, please call our office to schedule the follow-up appointment.

## 2014-11-28 NOTE — Progress Notes (Signed)
Patient ID: Tanya Owens, female   DOB: 15-Mar-1961, 54 y.o.   MRN: [id number]     HPI: Tanya Owens is a 54 y.o. female presents for an 20 month follow-up cardiology evaluation.  Tanya Owens  has a history previously documented stage II hypertension, a history of marked hyperlipidemia with marked elevation of triglycerides and very low HDL levels. She does have diabetes mellitus. She has been demonstrated to have significant increase in LDL particle numbers at 1926 in the past.   Is a history of diabetes mellitus and has been on metformin.  There is remote tobacco history, but she stopped smoking approximately 2 years ago.   she tells me she will be getting her Medicaid card. Over the past 18 months, she has not been very successful weight loss.  She denies recurrent chest pain.  Her blood pressure has been treated with amlodipine 10 mg, HCTZ 12.5 mg, and Toprol-XL 50 mg. Admit to some mild ankle swelling.  She is on atorvastatin 40 mg for hyperlipidemia.    since I last saw her, she underwent a 2-D echo Doppler study in February 2015.  Ejection fraction was 60-65%.  She had normal diastolic parameters.  There was mild MR.  Her left atrium was mildly dilated.  She underwent a nuclear perfusion study in January 2016 following a hospital evaluation.  This revealed normal perfusion with an ejection fraction of 53% without scar or ischemia.  She denies chest pain.  She denies PND, orthopnea.  She denies arrhythmia.  There is no , syncope or presyncope.  She presents for evaluation.  Past Medical History  Diagnosis Date  . Diabetes mellitus without complication   . Hypertension   . Hypercholesteremia     Past Surgical History  Procedure Laterality Date  . Cardiac catheterization  10/18/2007    Normal coronaries, systemic hypertension    No Known Allergies  Current Outpatient Prescriptions  Medication Sig Dispense Refill  . amLODipine (NORVASC) 10 MG tablet Take 1 tablet (10 mg total)  by mouth daily. 90 tablet 3  . atorvastatin (LIPITOR) 40 MG tablet Take 1 tablet (40 mg total) by mouth daily. 90 tablet 3  . cyclobenzaprine (FLEXERIL) 10 MG tablet Take 1 tablet (10 mg total) by mouth 3 (three) times daily as needed for muscle spasms (or pain). 15 tablet 0  . ibuprofen (ADVIL,MOTRIN) 200 MG tablet Take 400 mg by mouth every 6 (six) hours as needed for headache.    . metFORMIN (GLUCOPHAGE) 1000 MG tablet Take 1 tablet (1,000 mg total) by mouth 2 (two) times daily with a meal. 60 tablet 3  . metoprolol succinate (TOPROL-XL) 50 MG 24 hr tablet Take 1 tablet (50 mg total) by mouth daily. Take with or immediately following a meal. 90 tablet 3  . hydrochlorothiazide (HYDRODIURIL) 25 MG tablet Take 1 tablet (25 mg total) by mouth daily. 30 tablet 12   No current facility-administered medications for this visit.    History   Social History  . Marital Status: Single    Spouse Name: N/A  . Number of Children: N/A  . Years of Education: N/A   Occupational History  . Not on file.   Social History Main Topics  . Smoking status: Former Smoker    Quit date: 12/27/2012  . Smokeless tobacco: Never Used  . Alcohol Use: No  . Drug Use: No  . Sexual Activity: Yes    Birth Control/ Protection: Post-menopausal   Other Topics Concern  . Not  on file   Social History Narrative   Socially she is single has 5 children 4 grandchildren. She does not routinely exercise. There is a tobacco history. There is no alcohol use.  Family History  Problem Relation Age of Onset  . Heart attack Mother    ROS General: Negative; No fevers, chills, or night sweats;  No significant success with weight loss HEENT: Negative; No changes in vision or hearing, sinus congestion, difficulty swallowing Pulmonary: Negative; No cough, wheezing, shortness of breath, hemoptysis Cardiovascular: Negative; No chest pain, presyncope, syncope, palpitations GI: Negative; No nausea, vomiting, diarrhea, or  abdominal pain GU: Negative; No dysuria, hematuria, or difficulty voiding Musculoskeletal: Negative; no myalgias, joint pain, or weakness Hematologic/Oncology: Negative; no easy bruising, bleeding Endocrine:  Positive for diabetes mellitus; no heat/cold intolerance;  Neuro: Negative; no changes in balance, headaches Skin: Negative; No rashes or skin lesions Psychiatric: Negative; No behavioral problems, depression Sleep: Negative; No snoring, daytime sleepiness, hypersomnolence, bruxism, restless legs, hypnogognic hallucinations, no cataplexy Other comprehensive 14 point system review is negative.  PE BP 140/80 mmHg  Pulse 66  Ht 5' 9"  (1.753 m)  Wt 267 lb 4.8 oz (121.246 kg)  BMI 39.46 kg/m2   Wt Readings from Last 3 Encounters:  11/28/14 267 lb 4.8 oz (121.246 kg)  07/30/14 266 lb 4.8 oz (120.793 kg)  06/05/14 262 lb 3.2 oz (118.933 kg)   General: Alert, oriented, no distress.  Skin: normal turgor, no rashes HEENT: Normocephalic, atraumatic. Pupils round and reactive; sclera anicteric;no lid lag. Extraocular muscles intact. Nose without nasal septal hypertrophy Mouth/Parynx benign; Mallinpatti scale 3 Neck: No JVD, no carotid bruits; normal carotid upstroke Lungs: clear to ausculatation and percussion; no wheezing or rales Chest wall: no tenderness to palpitation Heart: RRR, s1 s2 normal 1/6 systolic murmur, .  No diastolic murmur.  No rubs thrills or heaves. Abdomen: Moderate central adiposity soft, nontender; no hepatosplenomehaly, BS+; abdominal aorta nontender and not dilated by palpation. Back: no CVA tenderness Pulses 2+ Extremities: no clubbing cyanosis or edema, Homan's sign negative  Neurologic: grossly nonfocal; cranial nerves grossly normal. Psychologic: normal affect and mood.  ECG (independently read by me):  Normal sinus rhythm at 66 bpm. Inferolateral T wave abnormality.   January 2015 ECG (independently read by me): Normal sinus rhythm at 71 beats per  minute. Nonspecific ST-T changes.  LABS: BMP Latest Ref Rng 04/25/2014 05/26/2013 05/26/2013  Glucose 70 - 99 mg/dL 88 331(H) 403(H)  BUN 6 - 23 mg/dL 12 10 11   Creatinine 0.50 - 1.10 mg/dL 0.70 0.58 0.71  Sodium 137 - 147 mEq/L 138 135(L) 131(L)  Potassium 3.7 - 5.3 mEq/L 3.8 3.8 4.3  Chloride 96 - 112 mEq/L 99 97 92(L)  CO2 19 - 32 mEq/L 23 24 24   Calcium 8.4 - 10.5 mg/dL 9.4 8.8 9.5   Hepatic Function Latest Ref Rng 05/26/2013 05/24/2013 06/10/2010  Total Protein 6.0 - 8.3 g/dL 7.7 7.7 7.0  Albumin 3.5 - 5.2 g/dL 4.0 4.1 3.9  AST 0 - 37 U/L 12 11 17   ALT 0 - 35 U/L 16 15 19   Alk Phosphatase 39 - 117 U/L 119(H) 131(H) 68  Total Bilirubin 0.3 - 1.2 mg/dL 0.4 0.4 0.4   CBC Latest Ref Rng 04/25/2014 05/26/2013 05/24/2013  WBC 4.0 - 10.5 K/uL 8.0 9.0 8.3  Hemoglobin 12.0 - 15.0 g/dL 12.1 13.5 14.1  Hematocrit 36.0 - 46.0 % 36.5 37.6 40.0  Platelets 150 - 400 K/uL 224 206 223   Lab Results  Component  Value Date   MCV 79.2 04/25/2014   MCV 78.5 05/26/2013   MCV 78.7 05/24/2013   No results found for: TSH   Lab Results  Component Value Date   HGBA1C * 10/17/2007    6.6 (NOTE)   The ADA recommends the following therapeutic goals for glycemic   control related to Hgb A1C measurement:   Goal of Therapy:   < 7.0% Hgb A1C   Action Suggested:  > 8.0% Hgb A1C   Ref:  Diabetes Care, 22, Suppl. 1, 1999    Lipid Panel     Component Value Date/Time   CHOL  10/18/2007 0539    184        ATP III CLASSIFICATION:  <200     mg/dL   Desirable  200-239  mg/dL   Borderline High  >=240    mg/dL   High   TRIG 219* 10/18/2007 0539   HDL 29* 10/18/2007 0539   CHOLHDL 6.3 10/18/2007 0539   VLDL 44* 10/18/2007 0539   LDLCALC * 10/18/2007 0539    111        Total Cholesterol/HDL:CHD Risk Coronary Heart Disease Risk Table                     Men   Women  1/2 Average Risk   3.4   3.3     RADIOLOGY: Ct Head Wo Contrast  05/24/2013   CLINICAL DATA:  Right arm numbness.  Left arm pain.  EXAM: CT HEAD  WITHOUT CONTRAST  TECHNIQUE: Contiguous axial images were obtained from the base of the skull through the vertex without intravenous contrast.  COMPARISON:  05/27/2012  FINDINGS: No mass effect, midline shift, or acute intracranial hemorrhage. Ventricular system, extra-axial space, and brain parenchyma are unremarkable. Mastoid air cells and visualized paranasal sinuses are clear. Intact cranium.  IMPRESSION: Negative PET-CT.   Electronically Signed   By: Maryclare Bean M.D.   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 She recently has had hyperglycemia associated with polyuria and polydipsia.  Her blood pressure today is mildly elevated at 144 over 80 when rechecked by me.  She also has a history of some mild ankle swelling. I am recommending further titration of her HCTZ from 12.5 mg to 25 mg daily. She will continue with her present dose of amlodipine 10 mg and Toprol-XL 50 mg.  She is diabetic.  I reviewed her echo Doppler data , which revealed normal systolic and diastolic function.  Her recent nuclear perfusion study in January 2016 showed normal perfusion. Her body mass index is 39.5 kg/m and she is approaching morbid obesity. We have strongly recommended.  Additional weight loss and exercise.  I commended her on her continued smoking cessation.  She will  have follow-up laboratory per primary M.D.  I will see her in one year for reevaluation.   Troy Sine, MD, [facility]  11/28/2014 12:02 PM

## 2015-01-17 ENCOUNTER — Other Ambulatory Visit: Payer: Self-pay | Admitting: *Deleted

## 2015-01-17 ENCOUNTER — Telehealth: Payer: Self-pay | Admitting: *Deleted

## 2015-01-17 MED ORDER — METOPROLOL SUCCINATE ER 50 MG PO TB24: 50.0000 mg | ORAL_TABLET | Freq: Every day | ORAL | Status: DC

## 2015-01-17 NOTE — Telephone Encounter (Signed)
ERROR

## 2015-01-29 ENCOUNTER — Other Ambulatory Visit: Payer: Self-pay | Admitting: Cardiovascular Disease

## 2015-01-29 NOTE — Telephone Encounter (Signed)
°  1. Which medications need to be refilled? Metformin   2. Which pharmacy is medication to be sent to? [facility] on [address]   3. Do they need a 30 day or 90 day supply? 90  4. Would they like a call back once the medication has been sent to the pharmacy? Yes

## 2015-01-29 NOTE — Telephone Encounter (Signed)
Rx(s) sent to pharmacy electronically.  

## 2015-12-12 ENCOUNTER — Other Ambulatory Visit: Payer: Self-pay | Admitting: *Deleted

## 2015-12-12 MED ORDER — AMLODIPINE BESYLATE 10 MG PO TABS: 10.0000 mg | ORAL_TABLET | Freq: Every day | ORAL | 0 refills | Status: DC

## 2016-02-06 ENCOUNTER — Other Ambulatory Visit: Payer: Self-pay | Admitting: Cardiovascular Disease

## 2016-02-07 ENCOUNTER — Encounter: Payer: Self-pay | Admitting: Family Medicine

## 2016-02-07 NOTE — Telephone Encounter (Signed)
Rx(s) sent to pharmacy electronically.  

## 2016-02-25 ENCOUNTER — Encounter: Payer: Self-pay | Admitting: Family Medicine

## 2016-02-25 ENCOUNTER — Encounter: Payer: Self-pay | Admitting: Internal Medicine

## 2016-02-25 ENCOUNTER — Ambulatory Visit (INDEPENDENT_AMBULATORY_CARE_PROVIDER_SITE_OTHER): Payer: Medicaid Other | Admitting: Family Medicine

## 2016-02-25 VITALS — BP 144/90 | HR 83 | Temp 97.8°F | Wt 256.0 lb

## 2016-02-25 DIAGNOSIS — E119 Type 2 diabetes mellitus without complications: Secondary | ICD-10-CM | POA: Diagnosis not present

## 2016-02-25 DIAGNOSIS — Z23 Encounter for immunization: Secondary | ICD-10-CM | POA: Diagnosis not present

## 2016-02-25 DIAGNOSIS — Z0000 Encounter for general adult medical examination without abnormal findings: Secondary | ICD-10-CM | POA: Diagnosis not present

## 2016-02-25 DIAGNOSIS — I10 Essential (primary) hypertension: Secondary | ICD-10-CM

## 2016-02-25 DIAGNOSIS — E118 Type 2 diabetes mellitus with unspecified complications: Secondary | ICD-10-CM | POA: Diagnosis present

## 2016-02-25 DIAGNOSIS — E782 Mixed hyperlipidemia: Secondary | ICD-10-CM

## 2016-02-25 LAB — BASIC METABOLIC PANEL WITH GFR
BUN: 12 mg/dL (ref 7–25)
CALCIUM: 9.4 mg/dL (ref 8.6–10.4)
CHLORIDE: 103 mmol/L (ref 98–110)
CO2: 22 mmol/L (ref 20–31)
CREATININE: 0.7 mg/dL (ref 0.50–1.05)
GFR, Est African American: >89 mL/min (ref >=60)
GFR, Est Non African American: >89 mL/min (ref >=60)
Glucose, Bld: 68 mg/dL (ref 65–99)
Potassium: 3.8 mmol/L (ref 3.5–5.3)
Sodium: 138 mmol/L (ref 135–146)

## 2016-02-25 LAB — POCT GLYCOSYLATED HEMOGLOBIN (HGB A1C): HEMOGLOBIN A1C: 6.3

## 2016-02-25 LAB — LDL CHOLESTEROL, DIRECT: LDL DIRECT: 97 mg/dL (ref <130)

## 2016-02-25 MED ORDER — HYDROCHLOROTHIAZIDE 25 MG PO TABS: 25.0000 mg | ORAL_TABLET | Freq: Every day | ORAL | 0 refills | Status: DC

## 2016-02-25 MED ORDER — METOPROLOL SUCCINATE ER 50 MG PO TB24: 50.0000 mg | ORAL_TABLET | Freq: Every day | ORAL | 0 refills | Status: DC

## 2016-02-25 MED ORDER — METFORMIN HCL 1000 MG PO TABS: 1000.0000 mg | ORAL_TABLET | Freq: Two times a day (BID) | ORAL | 0 refills | Status: DC

## 2016-02-25 MED ORDER — AMLODIPINE BESYLATE 10 MG PO TABS: 10.0000 mg | ORAL_TABLET | Freq: Every day | ORAL | 0 refills | Status: DC

## 2016-02-25 MED ORDER — ATORVASTATIN CALCIUM 40 MG PO TABS: 40.0000 mg | ORAL_TABLET | Freq: Every day | ORAL | 0 refills | Status: DC

## 2016-02-25 NOTE — Assessment & Plan Note (Signed)
Continue norvasc, HCTZ, toprol XL. Check BMP today.

## 2016-02-25 NOTE — Assessment & Plan Note (Signed)
Encouraged healthy diet and exercise. Patient given Dr. Gerilyn Pilgrim information and will make an appointment. Checked a1c today and at goal on current medication regiment of metformin 1000mg  BID

## 2016-02-25 NOTE — Assessment & Plan Note (Deleted)
Encouraged healthy diet and exercise. Patient given Dr. Sykes information and will make an appointment. Checked a1c today and at goal on current medication regiment of metformin 1000mg BID 

## 2016-02-25 NOTE — Progress Notes (Signed)
Subjective:  Tanya Owens is a 55 y.o. female who presents to the [facility] today to establish as new patient.  HPI:  Diabetes Has not seen a PCP for 10+years, states has been getting refills from cardiology but is wanting to take better care of herself. Used to check sugars at home a long time ago but lost machine. Medication Compliance: yes, has been taking metformin at home. Polyuria: no  Visual problems: yes, but also has glaucoma. Has appt to see optometrist on 04/01/16.  Denies CP, HA, SOB, dizziness. Diet: struggles with eating healthy, states is "greedy with food" and eats out frequently with high carb and fat meals but is very motivated to make changes. Would like to meet with nutritionist for meal planning and for better strategies.  Will discuss optho referral for diabetic eye exam at next visit.  HTN Has been following with cardiology for the last ~5 years but was told that needed to see PCP now. States is compliant on her medications and will bring them to next visit.   Health maintenance TDAP today, refused flu shot Would like mammogram and colonoscopy referrals  Wanted to discuss pap smear and other health maintenance issues at next visit.  ROS: L hand stiffness with gripping steering wheel, otherwise all systems reviewed and are negative  PMH: The following were reviewed and entered/updated in epic: Past Medical History:  Diagnosis Date  . Arthritis 2000  . Diabetes mellitus without complication (HCC) 2005  . Hypercholesteremia 2005  . Hypertension 2005  . Sleep apnea 2010   Patient Active Problem List   Diagnosis Date Noted  . Obesity (BMI 30-39.9) 11/28/2014  . Type 2 diabetes mellitus without complication (HCC) 11/28/2014  . Chest pain 06/05/2014  . Diabetes mellitus due to non-steroid drug without complication (HCC) 06/21/2013  . HTN (hypertension) 06/21/2013  . Mixed hyperlipidemia 06/21/2013  . Morbid obesity (HCC) 06/21/2013   Past Surgical  History:  Procedure Laterality Date  . CARDIAC CATHETERIZATION  10/18/2007   Normal coronaries, systemic hypertension    Family History  Problem Relation Age of Onset  . Heart attack Mother   . Diabetes Mother   . Hypercholesterolemia Mother   . Hypertension Mother   . Diabetes Sister   . Hypertension Brother     Medications- reviewed and updated Current Outpatient Prescriptions  Medication Sig Dispense Refill  . amLODipine (NORVASC) 10 MG tablet Take 1 tablet (10 mg total) by mouth daily. NEED OV. 90 tablet 0  . atorvastatin (LIPITOR) 40 MG tablet Take 1 tablet (40 mg total) by mouth daily. PLEASE CONTACT OFFICE FOR ADDITIONAL REFILLS 30 tablet 0  . hydrochlorothiazide (HYDRODIURIL) 25 MG tablet Take 1 tablet (25 mg total) by mouth daily. PLEASE CONTACT OFFICE FOR ADDITIONAL REFILLS 30 tablet 0  . ibuprofen (ADVIL,MOTRIN) 200 MG tablet Take 400 mg by mouth every 6 (six) hours as needed for headache.    . metFORMIN (GLUCOPHAGE) 1000 MG tablet Take 1 tablet (1,000 mg total) by mouth 2 (two) times daily with a meal. 30 tablet 0  . metoprolol succinate (TOPROL-XL) 50 MG 24 hr tablet Take 1 tablet (50 mg total) by mouth daily. Take with or immediately following a meal. 30 tablet 0   No current facility-administered medications for this visit.     Allergies-reviewed and updated No Known Allergies  Social History   Social History  . Marital status: Single    Spouse name: N/A  . Number of children: N/A  . Years of  education: N/A   Social History Main Topics  . Smoking status: Former Smoker    Quit date: 12/27/2012  . Smokeless tobacco: Never Used  . Alcohol use No  . Drug use: No  . Sexual activity: Yes    Birth control/ protection: Post-menopausal   Other Topics Concern  . None   Social History Narrative  . None    Objective:  Physical Exam: BP (!) 144/90   Pulse 83   Temp 97.8 F (36.6 C) (Oral)   Wt 256 lb (116.1 kg)   SpO2 100%   BMI 37.80 kg/m   Gen: NAD,  resting comfortably CV: RRR with no murmurs appreciated Pulm: NWOB, CTAB with no crackles, wheezes, or rhonchi GI: Normal bowel sounds present. Soft, Nontender, Nondistended. MSK: no edema, cyanosis, or clubbing noted Skin: warm, dry. Skin tags on face around eyes and forehead Neuro: grossly normal, moves all extremities Psych: Normal affect and thought content  Results for orders placed or performed in visit on 02/25/16 (from the past 72 hour(s))  HgB A1c     Status: None   Collection Time: 02/25/16  2:24 PM  Result Value Ref Range   Hemoglobin A1C 6.3      Assessment/Plan:  HTN (hypertension) Continue norvasc, HCTZ, toprol XL. Check BMP today.  Type 2 diabetes mellitus without complication Encouraged healthy diet and exercise. Patient given Dr. Gerilyn Pilgrim information and will make an appointment. Checked a1c today and at goal on current medication regiment of metformin 1000mg  BID  Mixed hyperlipidemia Check direct LDL today. Patient to continue lipitor 40mg  daily  Refills given today for 1 month TDAP given today, flu shot refused Mammogram and colonoscopy information given to patient today, will schedule.   Next visit in 1 month. Will discuss diabetes and more health maintenance.  , DO PGY-1, [facility] 02/25/2016 1:50 PM

## 2016-02-25 NOTE — Assessment & Plan Note (Signed)
Check direct LDL today. Patient to continue lipitor 40mg  daily

## 2016-02-25 NOTE — Patient Instructions (Addendum)
It was great to see you! I sent 1 month supply for all of your medications to your pharmacy.  For your diabetes, - Please make an appointment with our nutritionist Dr. Gerilyn Pilgrim - Keep taking your metformin, we will check bloodwork and talk more about your medications and eye doctor referral next visit.  For your health maintenance:  - You received the TDAP (tetanus ) vaccination today - You declined the annual flu shot - Please call and have your mammogram and colonoscopy done.   Things to do to keep yourself healthy  - Exercise at least 30-45 minutes a day, 3-4 days a week.  - Eat a low-fat diet with lots of fruits and vegetables, up to 7-9 servings per day.  - Seatbelts can save your life. Wear them always.  - Smoke detectors on every level of your home, check batteries every year.  - Eye Doctor - have an eye exam every 1-2 years  - Safe sex - if you may be exposed to STDs, use a condom.  - Alcohol -  If you drink, do it moderately, less than 2 drinks per day.  - Health Care Power of Attorney. Choose someone to speak for you if you are not able.  - Depression is common in our stressful world.If you're feeling down or losing interest in things you normally enjoy, please come in for a visit.  - Violence - If anyone is threatening or hurting you, please call immediately.   We are checking some labs today, and someone will call you or send you a letter with the results when they are available.   Please make an appointment for 1 month from now to keep working on your other health maintenance concerns and to talk more about your diabetes.  Leland Her, DO [facility]

## 2016-03-30 ENCOUNTER — Ambulatory Visit: Payer: Medicaid Other | Admitting: Family Medicine

## 2016-03-31 ENCOUNTER — Other Ambulatory Visit: Payer: Self-pay | Admitting: Family Medicine

## 2016-04-01 NOTE — Telephone Encounter (Signed)
Tried calling patient to schedule an appointment for her diabetes but VM was full.  Will send a letter requesting that she call to make an appointment. Tanya Owens,CMA

## 2016-04-03 ENCOUNTER — Other Ambulatory Visit: Payer: Self-pay | Admitting: Family Medicine

## 2016-04-14 ENCOUNTER — Ambulatory Visit: Payer: Medicaid Other | Admitting: Family Medicine

## 2016-04-27 ENCOUNTER — Ambulatory Visit (INDEPENDENT_AMBULATORY_CARE_PROVIDER_SITE_OTHER): Payer: Medicaid Other | Admitting: Family Medicine

## 2016-04-27 ENCOUNTER — Encounter: Payer: Self-pay | Admitting: Family Medicine

## 2016-04-27 ENCOUNTER — Other Ambulatory Visit: Payer: Self-pay | Admitting: Family Medicine

## 2016-04-27 VITALS — BP 130/100 | HR 69 | Temp 98.1°F | Ht 69.0 in | Wt 258.2 lb

## 2016-04-27 DIAGNOSIS — M2550 Pain in unspecified joint: Secondary | ICD-10-CM | POA: Diagnosis present

## 2016-04-27 DIAGNOSIS — Z0000 Encounter for general adult medical examination without abnormal findings: Secondary | ICD-10-CM | POA: Diagnosis not present

## 2016-04-27 DIAGNOSIS — G8929 Other chronic pain: Secondary | ICD-10-CM | POA: Insufficient documentation

## 2016-04-27 DIAGNOSIS — Z1231 Encounter for screening mammogram for malignant neoplasm of breast: Secondary | ICD-10-CM

## 2016-04-27 LAB — TSH: TSH: 0.94 mIU/L

## 2016-04-27 LAB — HEPATITIS C ANTIBODY: HCV AB: NEGATIVE

## 2016-04-27 LAB — POCT SEDIMENTATION RATE: POCT SED RATE: 28 mm/hr — AB (ref 0–22)

## 2016-04-27 MED ORDER — IBUPROFEN 600 MG PO TABS: 600.0000 mg | ORAL_TABLET | Freq: Three times a day (TID) | ORAL | 0 refills | Status: DC | PRN

## 2016-04-27 NOTE — Progress Notes (Signed)
    Subjective:  Tanya Owens is a 55 y.o. female who presents to the [facility] today with a chief complaint of joint pain.   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 - Has noticed hands swelling, L calf cramping, R shoulder pain, b/l knee pain all intermittently and at different times.  - Started taking excedrin XL 2 days ago, approximately 5-6 pills at a time each for 3-4 times a day which helps but is still having pain.  - Denies HA, vision changes, dry mouth, CP, SOB, n/v, rashes, diarrhea, constipation, urinary sx.   ROS: Per HPI  Objective:  Physical Exam: BP (!) 130/100 (BP Location: Left Arm, Patient Position: Sitting, Cuff Size: Large)   Pulse 69   Temp 98.1 F (36.7 C) (Oral)   Ht 5\' 9"  (1.753 m)   Wt 258 lb 3.2 oz (117.1 kg)   SpO2 99%   BMI 38.13 kg/m   Gen: NAD, resting comfortably CV: RRR with no murmurs appreciated. 2+ peripheral pulses Pulm: NWOB, CTAB with no crackles, wheezes, or rhonchi GI: Normal bowel sounds present. Soft, Nontender, Nondistended. MSK: reduced ROM due to pain when making a fist b/l or raising arms above head but full passive ROM. No clubbing or edema. No joint erythema or warmth to palpation in b/l hands. Extremities: neg Homans sign, no calf tenderness. Skin: warm, dry Neuro: grossly normal, moves all extremities Psych: Normal affect and thought content, affect appropriate  Assessment/Plan:  Polyarthralgia Patient is new to practice with a long standing history migratory joint pain that had acutely worsened over the last 3-4 weeks. Will check labs today. Advised patient to try ibuprofen instead of excedrin.    , DO PGY-1, [facility] 04/27/2016 10:35 AM

## 2016-04-27 NOTE — Assessment & Plan Note (Signed)
Patient is new to practice with a long standing history migratory joint pain that had acutely worsened over the last 3-4 weeks. Will check labs today. Advised patient to try ibuprofen instead of excedrin.

## 2016-04-27 NOTE — Patient Instructions (Signed)
It was great to see you again!  For your joint pain,  - Please take ibuprofen 600mg  every 8 hours as needed - We are checking some labs today, and someone will call you or send you a letter with the results when they are available.   Please schedule an appointment for your pap smear.  Take care and seek immediate care sooner if you develop any concerns.   Dr. , DO [facility]

## 2016-04-28 ENCOUNTER — Ambulatory Visit
Admission: RE | Admit: 2016-04-28 | Discharge: 2016-04-28 | Disposition: A | Discharge status: Home / Self Care | Payer: Medicaid Other | Source: Ambulatory Visit | Attending: Family Medicine | Admitting: Family Medicine

## 2016-04-28 ENCOUNTER — Encounter: Payer: Medicaid Other | Admitting: Internal Medicine

## 2016-04-28 DIAGNOSIS — Z1231 Encounter for screening mammogram for malignant neoplasm of breast: Secondary | ICD-10-CM

## 2016-04-28 LAB — ANA: ANA: NEGATIVE

## 2016-04-28 LAB — BASIC METABOLIC PANEL WITH GFR
BUN: 9 mg/dL (ref 7–25)
CALCIUM: 9 mg/dL (ref 8.6–10.4)
CO2: 24 mmol/L (ref 20–31)
Chloride: 112 mmol/L — ABNORMAL HIGH (ref 98–110)
Creat: 0.68 mg/dL (ref 0.50–1.05)
GFR, Est African American: >89 mL/min (ref >=60)
GFR, Est Non African American: >89 mL/min (ref >=60)
Glucose, Bld: 95 mg/dL (ref 65–99)
Potassium: 4.5 mmol/L (ref 3.5–5.3)
SODIUM: 141 mmol/L (ref 135–146)

## 2016-04-28 LAB — URIC ACID: Uric Acid, Serum: 5.7 mg/dL (ref 2.5–7.0)

## 2016-04-28 LAB — C-REACTIVE PROTEIN: CRP: 1.4 mg/L (ref <8.0)

## 2016-04-28 LAB — RHEUMATOID FACTOR

## 2016-04-28 LAB — HIV ANTIBODY (ROUTINE TESTING W REFLEX): HIV 1&2 Ab, 4th Generation: NONREACTIVE

## 2016-05-03 ENCOUNTER — Encounter: Payer: Self-pay | Admitting: Family Medicine

## 2016-05-07 ENCOUNTER — Encounter: Payer: Self-pay | Admitting: Family Medicine

## 2016-05-27 ENCOUNTER — Encounter: Payer: Medicaid Other | Admitting: Internal Medicine

## 2016-06-03 ENCOUNTER — Ambulatory Visit: Payer: Medicaid Other | Admitting: Family Medicine

## 2016-06-06 ENCOUNTER — Other Ambulatory Visit: Payer: Self-pay | Admitting: Family Medicine

## 2016-06-06 DIAGNOSIS — M2550 Pain in unspecified joint: Secondary | ICD-10-CM

## 2016-06-10 ENCOUNTER — Ambulatory Visit: Payer: Medicaid Other | Admitting: Family Medicine

## 2016-07-10 ENCOUNTER — Ambulatory Visit: Payer: Medicaid Other | Admitting: Student

## 2016-07-23 ENCOUNTER — Emergency Department (HOSPITAL_COMMUNITY)
Admission: EM | Admit: 2016-07-23 | Discharge: 2016-07-23 | Disposition: A | Discharge status: Home / Self Care | Payer: Medicaid Other | Attending: Emergency Medicine | Admitting: Emergency Medicine

## 2016-07-23 ENCOUNTER — Encounter (HOSPITAL_COMMUNITY): Payer: Self-pay

## 2016-07-23 DIAGNOSIS — M2550 Pain in unspecified joint: Secondary | ICD-10-CM

## 2016-07-23 DIAGNOSIS — Z79899 Other long term (current) drug therapy: Secondary | ICD-10-CM | POA: Insufficient documentation

## 2016-07-23 DIAGNOSIS — M25572 Pain in left ankle and joints of left foot: Secondary | ICD-10-CM | POA: Insufficient documentation

## 2016-07-23 DIAGNOSIS — M25511 Pain in right shoulder: Secondary | ICD-10-CM | POA: Insufficient documentation

## 2016-07-23 DIAGNOSIS — E119 Type 2 diabetes mellitus without complications: Secondary | ICD-10-CM | POA: Insufficient documentation

## 2016-07-23 DIAGNOSIS — M25571 Pain in right ankle and joints of right foot: Secondary | ICD-10-CM | POA: Insufficient documentation

## 2016-07-23 DIAGNOSIS — I10 Essential (primary) hypertension: Secondary | ICD-10-CM | POA: Insufficient documentation

## 2016-07-23 DIAGNOSIS — Z87891 Personal history of nicotine dependence: Secondary | ICD-10-CM | POA: Insufficient documentation

## 2016-07-23 DIAGNOSIS — Z7984 Long term (current) use of oral hypoglycemic drugs: Secondary | ICD-10-CM | POA: Insufficient documentation

## 2016-07-23 DIAGNOSIS — M25542 Pain in joints of left hand: Secondary | ICD-10-CM | POA: Insufficient documentation

## 2016-07-23 DIAGNOSIS — M25512 Pain in left shoulder: Secondary | ICD-10-CM | POA: Insufficient documentation

## 2016-07-23 DIAGNOSIS — M25541 Pain in joints of right hand: Secondary | ICD-10-CM | POA: Insufficient documentation

## 2016-07-23 LAB — COMPREHENSIVE METABOLIC PANEL
ALT: 22 U/L (ref 14–54)
AST: 21 U/L (ref 15–41)
Albumin: 4.4 g/dL (ref 3.5–5.0)
Alkaline Phosphatase: 76 U/L (ref 38–126)
Anion gap: 8 (ref 5–15)
BUN: 11 mg/dL (ref 6–20)
CALCIUM: 9.8 mg/dL (ref 8.9–10.3)
CHLORIDE: 104 mmol/L (ref 101–111)
CO2: 26 mmol/L (ref 22–32)
CREATININE: 0.66 mg/dL (ref 0.44–1.00)
Glucose, Bld: 124 mg/dL — ABNORMAL HIGH (ref 65–99)
Potassium: 3.5 mmol/L (ref 3.5–5.1)
Sodium: 138 mmol/L (ref 135–145)
Total Bilirubin: 0.5 mg/dL (ref 0.3–1.2)
Total Protein: 8.3 g/dL — ABNORMAL HIGH (ref 6.5–8.1)

## 2016-07-23 LAB — CBC WITH DIFFERENTIAL/PLATELET
Basophils Absolute: 0 10*3/uL (ref 0.0–0.1)
Basophils Relative: 0 %
EOS PCT: 5 %
Eosinophils Absolute: 0.4 10*3/uL (ref 0.0–0.7)
HCT: 35.6 % — ABNORMAL LOW (ref 36.0–46.0)
Hemoglobin: 11.9 g/dL — ABNORMAL LOW (ref 12.0–15.0)
LYMPHS ABS: 3.2 10*3/uL (ref 0.7–4.0)
LYMPHS PCT: 35 %
MCH: 25.8 pg — AB (ref 26.0–34.0)
MCHC: 33.4 g/dL (ref 30.0–36.0)
MCV: 77.2 fL — AB (ref 78.0–100.0)
MONO ABS: 0.5 10*3/uL (ref 0.1–1.0)
MONOS PCT: 5 %
Neutro Abs: 5.1 10*3/uL (ref 1.7–7.7)
Neutrophils Relative %: 55 %
PLATELETS: 267 10*3/uL (ref 150–400)
RBC: 4.61 MIL/uL (ref 3.87–5.11)
RDW: 15 % (ref 11.5–15.5)
WBC: 9.2 10*3/uL (ref 4.0–10.5)

## 2016-07-23 LAB — CBG MONITORING, ED: Glucose-Capillary: 112 mg/dL — ABNORMAL HIGH (ref 65–99)

## 2016-07-23 MED ORDER — IBUPROFEN 800 MG PO TABS: 800.0000 mg | ORAL_TABLET | Freq: Three times a day (TID) | ORAL | 0 refills | Status: DC

## 2016-07-23 MED ORDER — GABAPENTIN 300 MG PO CAPS
300.0000 mg | ORAL_CAPSULE | Freq: Once | ORAL | Status: AC
  Administered 2016-07-23: 300 mg via ORAL
  Filled 2016-07-23: qty 1

## 2016-07-23 MED ORDER — GABAPENTIN 300 MG PO CAPS: 300.0000 mg | ORAL_CAPSULE | Freq: Three times a day (TID) | ORAL | 0 refills | Status: DC

## 2016-07-23 NOTE — ED Triage Notes (Signed)
Patient states being in pain in bones, legs, feet, arms, shoulders 9/10 for the last two weeks.  Patient has taken Motrin but says it doesn't work.  Patient also states numbness in feet and difficulty walking.

## 2016-07-23 NOTE — ED Triage Notes (Signed)
Pt stated that she was seen by PCP, motrin was prescribed

## 2016-07-23 NOTE — ED Provider Notes (Signed)
[facility]-EMERGENCY DEPT Provider Note   CSN: [id number] Arrival date & time: 07/23/16  1403     History   Chief Complaint Chief Complaint  Patient presents with  . Leg Pain  . Foot Pain  . Shoulder Pain  . Hand Pain    HPI   Blood pressure 145/92, pulse 80, temperature 98 F (36.7 C), temperature source Oral, height 5\' 9"  (1.753 m), weight 117 kg, SpO2 97 %.  Tanya Owens is a 56 y.o. female with past medical history significant for hypertension, arthritis, non-insulin-dependent diabetes complaining of ear bilateral posterior shoulder pain significantly worsening over the last 2 weeks and she is also having severe plantar foot pain starting 2 days ago with some numbness that is making it very difficult for her walk she states that the pain is worse in the morning and improves throughout the day. She saw her primary care for this and was given a prescription for 600 mg Motrin which she's taking with little relief. She denies low back pain, fever, chills, decreased by mouth intake, chest pain, shortness of breath, cough, abdominal pain or change in bowel or bladder habits. Chart review shows that this patient had negative CRP, sedimentation rate ANA and uric acid in December.  Past Medical History:  Diagnosis Date  . Arthritis 2000  . Diabetes mellitus without complication (HCC) 2005  . Hypercholesteremia 2005  . Hypertension 2005  . Sleep apnea 2010    Patient Active Problem List   Diagnosis Date Noted  . Polyarthralgia 04/27/2016  . Obesity (BMI 30-39.9) 11/28/2014  . Type 2 diabetes mellitus without complication (HCC) 11/28/2014  . Chest pain 06/05/2014  . HTN (hypertension) 06/21/2013  . Mixed hyperlipidemia 06/21/2013  . Morbid obesity (HCC) 06/21/2013    Past Surgical History:  Procedure Laterality Date  . CARDIAC CATHETERIZATION  10/18/2007   Normal coronaries, systemic hypertension    OB History    No data available       Home Medications    Prior to  Admission medications   Medication Sig Start Date End Date Taking? Authorizing Provider  amLODipine (NORVASC) 10 MG tablet Take 1 tablet (10 mg total) by mouth daily. NEED OV. 02/25/16  Yes Leland Her, DO  atorvastatin (LIPITOR) 40 MG tablet take 1 tablet by mouth once daily AT 6 PM 06/08/16  Yes Elsia Rodolph Bong, DO  hydrochlorothiazide (HYDRODIURIL) 25 MG tablet take 1 tablet by mouth once daily 06/08/16  Yes Elsia Rodolph Bong, DO  metFORMIN (GLUCOPHAGE) 1000 MG tablet Take 1 tablet (1,000 mg total) by mouth 2 (two) times daily with a meal. 04/01/16  Yes Elsia Rodolph Bong, DO  metoprolol succinate (TOPROL-XL) 50 MG 24 hr tablet take 1 tablet by mouth once daily TAKE WITH IMMEDIATELY FOLLOWING A MEAL 06/08/16  Yes Elsia Rodolph Bong, DO  gabapentin (NEURONTIN) 300 MG capsule Take 1 capsule (300 mg total) by mouth 3 (three) times daily. Start 300 mg by mouth daily for 1 day then increase to 300 mg by mouth twice a day for 1 day increase to 300 mg by mouth 3 times daily to a max of 3600 mg per day. 07/23/16   Jahaira Earnhart, PA-C  ibuprofen (ADVIL,MOTRIN) 800 MG tablet Take 1 tablet (800 mg total) by mouth 3 (three) times daily. 07/23/16   Joni Reining Maximo Spratling, PA-C    Family History Family History  Problem Relation Age of Onset  . Heart attack Mother   . Diabetes Mother   . Hypercholesterolemia Mother   .  Hypertension Mother   . Diabetes Sister   . Hypertension Brother     Social History Social History  Substance Use Topics  . Smoking status: Former Smoker    Quit date: 12/27/2012  . Smokeless tobacco: Never Used  . Alcohol use No     Allergies   Patient has no known allergies.   Review of Systems Review of Systems  10 systems reviewed and found to be negative, except as noted in the HPI.  Physical Exam Updated Vital Signs BP 162/93 (BP Location: Left Arm)   Pulse 68   Temp 98 F (36.7 C) (Oral)   Resp 20   Ht 5\' 9"  (1.753 m)   Wt 117 kg   SpO2 100%   BMI 38.10 kg/m   Physical Exam    Constitutional: She is oriented to person, place, and time. She appears well-developed and well-nourished. No distress.  HENT:  Head: Normocephalic and atraumatic.  Mouth/Throat: Oropharynx is clear and moist.  Eyes: Conjunctivae and EOM are normal. Pupils are equal, round, and reactive to light.  Neck: Normal range of motion.  Cardiovascular: Normal rate, regular rhythm and intact distal pulses.   Pulmonary/Chest: Effort normal and breath sounds normal.  Abdominal: Soft. There is no tenderness.  Musculoskeletal: Normal range of motion.  DP and PT pulses are 2+ bilaterally, there is no edema to lower extremity, extensor hallux longus is 5 out of 5 bilaterally  Neurological: She is alert and oriented to person, place, and time.  Skin: She is not diaphoretic.  Psychiatric: She has a normal mood and affect.  Nursing note and vitals reviewed.    ED Treatments / Results  Labs (all labs ordered are listed, but only abnormal results are displayed) Labs Reviewed  CBC WITH DIFFERENTIAL/PLATELET - Abnormal; Notable for the following:       Result Value   Hemoglobin 11.9 (*)    HCT 35.6 (*)    MCV 77.2 (*)    MCH 25.8 (*)    All other components within normal limits  COMPREHENSIVE METABOLIC PANEL - Abnormal; Notable for the following:    Glucose, Bld 124 (*)    Total Protein 8.3 (*)    All other components within normal limits  CBG MONITORING, ED - Abnormal; Notable for the following:    Glucose-Capillary 112 (*)    All other components within normal limits    EKG  EKG Interpretation None       Radiology No results found.  Procedures Procedures (including critical care time)  Medications Ordered in ED Medications  gabapentin (NEURONTIN) capsule 300 mg (300 mg Oral Given 07/23/16 1825)     Initial Impression / Assessment and Plan / ED Course  I have reviewed the triage vital signs and the nursing notes.  Pertinent labs & imaging results that were available during my  care of the patient were reviewed by me and considered in my medical decision making (see chart for details).     Vitals:   07/23/16 1446 07/23/16 1448 07/23/16 1837  BP: 145/92  162/93  Pulse: 80  68  Resp:   20  Temp: 98 F (36.7 C)    TempSrc: Oral    SpO2: 97%  100%  Weight:  117 kg   Height:  5\' 9"  (1.753 m)     Medications  gabapentin (NEURONTIN) capsule 300 mg (300 mg Oral Given 07/23/16 1825)    Tanya Owens is 56 y.o. female presenting with Chronic shoulder and upper arm  pain she's also experiencing some new and severe plantar foot pain with associated numbness worse on the left than the right. I think there is likely an element of diabetic neuropathy combined with plantar fasciitis. The appropriate workup done by primary care is negative for rheumatic disease.  She has no focal chest pain or abdominal pain. She also doesn't have any back pain or incontinence.  Will check basic blood work and give gabapentin, we've had an extensive discussion on plantar fasciitis and patient will be referred to orthopedics.  Blood work reassuring, patient improved with gabapentin. Counseled her on close follow-up with primary care and orthopedist.  Evaluation does not show pathology that would require ongoing emergent intervention or inpatient treatment. Pt is hemodynamically stable and mentating appropriately. Discussed findings and plan with patient/guardian, who agrees with care plan. All questions answered. Return precautions discussed and outpatient follow up given.    Final Clinical Impressions(s) / ED Diagnoses   Final diagnoses:  Polyarthralgia    New Prescriptions New Prescriptions   GABAPENTIN (NEURONTIN) 300 MG CAPSULE    Take 1 capsule (300 mg total) by mouth 3 (three) times daily. Start 300 mg by mouth daily for 1 day then increase to 300 mg by mouth twice a day for 1 day increase to 300 mg by mouth 3 times daily to a max of 3600 mg per day.   IBUPROFEN (ADVIL,MOTRIN)  800 MG TABLET    Take 1 tablet (800 mg total) by mouth 3 (three) times daily.     Wynetta Emery, PA-C 07/23/16 1935    Raeford Razor, MD 08/04/16 [id number]

## 2016-07-23 NOTE — ED Notes (Signed)
Patient was alert, oriented and stable upon discharge. RN went over AVS and patient had no further questions.  

## 2016-08-13 ENCOUNTER — Telehealth: Payer: Self-pay | Admitting: Family Medicine

## 2016-08-13 NOTE — Telephone Encounter (Signed)
Please have patient call [facility] to enroll in there program.  Clovis Pu, RN

## 2016-08-13 NOTE — Telephone Encounter (Signed)
Pt  calling to request refill of:  Name of Medication(s):  Everything  Last date of OV:04-27-16   Pharmacy:  [facility]  Will route refill request to Clinic RN.  Discussed with patient policy to call pharmacy for future refills.  Also, discussed refills may take up to 48 hours to approve or deny.  Tanya Owens

## 2016-08-13 NOTE — Telephone Encounter (Signed)
Pt was informed. ep °

## 2016-08-13 NOTE — Telephone Encounter (Signed)
Pt vm was full. I will try to call pt again before the end of the day. ep

## 2016-08-18 ENCOUNTER — Other Ambulatory Visit: Payer: Self-pay | Admitting: Family Medicine

## 2016-08-18 NOTE — Telephone Encounter (Signed)
Pt called and wanted the doctor to know that she now has the orange card and will be using the [facility] program. She needs the doctor to call or fax all her medications to them so that she can get her medication today by 5:00 PM.jw

## 2016-08-19 MED ORDER — METOPROLOL SUCCINATE ER 50 MG PO TB24: 50.0000 mg | ORAL_TABLET | Freq: Every day | ORAL | 2 refills | Status: DC

## 2016-08-19 MED ORDER — AMLODIPINE BESYLATE 10 MG PO TABS: 10.0000 mg | ORAL_TABLET | Freq: Every day | ORAL | 2 refills | Status: DC

## 2016-08-19 MED ORDER — HYDROCHLOROTHIAZIDE 25 MG PO TABS: 25.0000 mg | ORAL_TABLET | Freq: Every day | ORAL | 2 refills | Status: DC

## 2016-08-19 MED ORDER — GABAPENTIN 300 MG PO CAPS: 300.0000 mg | ORAL_CAPSULE | Freq: Three times a day (TID) | ORAL | 2 refills | Status: DC

## 2016-08-19 MED ORDER — ATORVASTATIN CALCIUM 40 MG PO TABS: ORAL_TABLET | ORAL | 2 refills | Status: DC

## 2016-08-19 MED ORDER — METFORMIN HCL 1000 MG PO TABS: 1000.0000 mg | ORAL_TABLET | Freq: Two times a day (BID) | ORAL | 3 refills | Status: DC

## 2016-08-25 ENCOUNTER — Ambulatory Visit (INDEPENDENT_AMBULATORY_CARE_PROVIDER_SITE_OTHER): Payer: Self-pay | Admitting: Family Medicine

## 2016-08-25 ENCOUNTER — Encounter: Payer: Self-pay | Admitting: Family Medicine

## 2016-08-25 VITALS — BP 124/66 | HR 86 | Temp 98.1°F | Ht 69.0 in | Wt 253.2 lb

## 2016-08-25 DIAGNOSIS — E119 Type 2 diabetes mellitus without complications: Secondary | ICD-10-CM

## 2016-08-25 DIAGNOSIS — G5603 Carpal tunnel syndrome, bilateral upper limbs: Secondary | ICD-10-CM

## 2016-08-25 LAB — POCT GLYCOSYLATED HEMOGLOBIN (HGB A1C): HEMOGLOBIN A1C: 6.6

## 2016-08-25 MED ORDER — IBUPROFEN 800 MG PO TABS: 800.0000 mg | ORAL_TABLET | Freq: Three times a day (TID) | ORAL | 0 refills | Status: DC

## 2016-08-25 MED ORDER — IBUPROFEN 600 MG PO TABS: 600.0000 mg | ORAL_TABLET | Freq: Four times a day (QID) | ORAL | 3 refills | Status: DC | PRN

## 2016-08-25 NOTE — Patient Instructions (Addendum)
It was great to see you again!  Please try cock up wrist splints at night for carpal tunnel  You can pick them up at [facility] [address] [place], [place], [place] [address]  Please take ibuprofen 600mg  every 6 hours as needed  Take care and seek immediate care sooner if you develop any concerns.   Make an appointment to talk about your diabetes.   Dr. , DO [facility]  You asked me about your medications, listed below are the names and why you take them: Amlodipine (norvasc) 10mg  daily for blood pressure Atorvastatin (lipitor) 40mg  daily for cholesterol HCTZ (hydrochlorothiazide) 25mg  daily for blood pressure

## 2016-08-25 NOTE — Progress Notes (Signed)
    Subjective:  Tanya Owens is a 56 y.o. female who presents to the [facility] today with a chief complaint of hand pain.   HPI:  States has chronic B/l hand pain and swelling with numbness and tingling. Different from her chronic joint pain for which she was seen in Dec 2017. Worse with increased use. States she frequently "massages her arms and hands like trying to get blood flow down" but does not drop objects. Has been taking over the counter ibuprofen 1600mg  three times a day. Was taking gabapentin for her feet back in the beginning of March but her foot pain resolved and did not seem to help her hand pain so she stopped taking it. No weakness, rashes.  ROS: Per HPI  Objective:  Physical Exam: BP 124/66   Pulse 86   Temp 98.1 F (36.7 C) (Oral)   Ht 5\' 9"  (1.753 m)   Wt 253 lb 3.2 oz (114.9 kg)   SpO2 99%   BMI 37.39 kg/m   Gen: NAD, resting comfortably CV: RRR with no murmurs appreciated. 2+ radial pulses Pulm: NWOB, CTAB with no crackles, wheezes, or rhonchi MSK:  reduced ROM due to pain when making a fist but 5/5 strength.  Skin: warm, dry Neuro: grossly normal, moves all extremities. Sensation intact in hand and arms. Psych: Normal affect and thought content   Assessment/Plan:  Bilateral carpal tunnel syndrome Patient has sensation of numbness/tingling in her hands in addition to hand swelling and pain. Reviewed labs from Dec 2017 that are neg rheumatoid factor, ANA, CRP and TSH. Unlikely to be rheumatologic process. Given sensation of wringing her hands in addition to the paresthesias, will trial cock up wrist splints at night. Also counseled patient on appropriate use of NSAIDs. Recommended ibuprofen 600mg  q6h prn   , DO PGY-1, [facility] 08/25/2016 3:33 PM

## 2016-08-25 NOTE — Assessment & Plan Note (Signed)
Patient has sensation of numbness/tingling in her hands in addition to hand swelling and pain. Reviewed labs from Dec 2017 that are neg rheumatoid factor, ANA, CRP and TSH. Unlikely to be rheumatologic process. Given sensation of wringing her hands in addition to the paresthesias, will trial cock up wrist splints at night. Also counseled patient on appropriate use of NSAIDs. Recommended ibuprofen 600mg  q6h prn

## 2016-08-26 ENCOUNTER — Telehealth: Payer: Self-pay | Admitting: *Deleted

## 2016-08-26 DIAGNOSIS — G5603 Carpal tunnel syndrome, bilateral upper limbs: Secondary | ICD-10-CM

## 2016-08-26 MED ORDER — IBUPROFEN 800 MG PO TABS: 800.0000 mg | ORAL_TABLET | Freq: Four times a day (QID) | ORAL | 0 refills | Status: DC | PRN

## 2016-08-26 NOTE — Telephone Encounter (Signed)
Received fax from [facility] stating they only carry 800mg  ibuprofen. Patient will need new rx sent over. Will forward to MD.

## 2016-09-07 ENCOUNTER — Emergency Department (HOSPITAL_COMMUNITY)
Admission: EM | Admit: 2016-09-07 | Discharge: 2016-09-07 | Disposition: A | Discharge status: Home / Self Care | Payer: Self-pay | Attending: Emergency Medicine | Admitting: Emergency Medicine

## 2016-09-07 ENCOUNTER — Encounter (HOSPITAL_COMMUNITY): Payer: Self-pay | Admitting: Emergency Medicine

## 2016-09-07 DIAGNOSIS — Z87891 Personal history of nicotine dependence: Secondary | ICD-10-CM | POA: Insufficient documentation

## 2016-09-07 DIAGNOSIS — R252 Cramp and spasm: Secondary | ICD-10-CM

## 2016-09-07 DIAGNOSIS — E119 Type 2 diabetes mellitus without complications: Secondary | ICD-10-CM | POA: Insufficient documentation

## 2016-09-07 DIAGNOSIS — Z79899 Other long term (current) drug therapy: Secondary | ICD-10-CM | POA: Insufficient documentation

## 2016-09-07 DIAGNOSIS — Z7984 Long term (current) use of oral hypoglycemic drugs: Secondary | ICD-10-CM | POA: Insufficient documentation

## 2016-09-07 LAB — CBC WITH DIFFERENTIAL/PLATELET
Basophils Absolute: 0 10*3/uL (ref 0.0–0.1)
Basophils Relative: 0 %
EOS ABS: 0.2 10*3/uL (ref 0.0–0.7)
EOS PCT: 2 %
HCT: 35 % — ABNORMAL LOW (ref 36.0–46.0)
Hemoglobin: 11.9 g/dL — ABNORMAL LOW (ref 12.0–15.0)
LYMPHS ABS: 1.7 10*3/uL (ref 0.7–4.0)
Lymphocytes Relative: 21 %
MCH: 25.7 pg — AB (ref 26.0–34.0)
MCHC: 34 g/dL (ref 30.0–36.0)
MCV: 75.6 fL — ABNORMAL LOW (ref 78.0–100.0)
Monocytes Absolute: 0.5 10*3/uL (ref 0.1–1.0)
Monocytes Relative: 6 %
Neutro Abs: 5.8 10*3/uL (ref 1.7–7.7)
Neutrophils Relative %: 71 %
Platelets: 256 10*3/uL (ref 150–400)
RBC: 4.63 MIL/uL (ref 3.87–5.11)
RDW: 14.9 % (ref 11.5–15.5)
WBC: 8.1 10*3/uL (ref 4.0–10.5)

## 2016-09-07 LAB — BASIC METABOLIC PANEL
Anion gap: 8 (ref 5–15)
BUN: 9 mg/dL (ref 6–20)
CHLORIDE: 105 mmol/L (ref 101–111)
CO2: 25 mmol/L (ref 22–32)
CREATININE: 0.59 mg/dL (ref 0.44–1.00)
Calcium: 9.3 mg/dL (ref 8.9–10.3)
GFR calc Af Amer: >60 mL/min (ref >60)
Glucose, Bld: 114 mg/dL — ABNORMAL HIGH (ref 65–99)
Potassium: 3.6 mmol/L (ref 3.5–5.1)
SODIUM: 138 mmol/L (ref 135–145)

## 2016-09-07 LAB — MAGNESIUM: MAGNESIUM: 1.6 mg/dL — AB (ref 1.7–2.4)

## 2016-09-07 MED ORDER — KETOROLAC TROMETHAMINE 30 MG/ML IJ SOLN
30.0000 mg | Freq: Once | INTRAMUSCULAR | Status: AC
  Administered 2016-09-07: 30 mg via INTRAMUSCULAR
  Filled 2016-09-07: qty 1

## 2016-09-07 MED ORDER — MAGNESIUM OXIDE 400 (241.3 MG) MG PO TABS
400.0000 mg | ORAL_TABLET | Freq: Once | ORAL | Status: AC
Start: 2016-09-07 — End: 2016-09-07
  Administered 2016-09-07: 400 mg via ORAL
  Filled 2016-09-07: qty 1

## 2016-09-07 MED ORDER — GABAPENTIN 300 MG PO CAPS
300.0000 mg | ORAL_CAPSULE | Freq: Once | ORAL | Status: AC
Start: 2016-09-07 — End: 2016-09-07
  Administered 2016-09-07: 300 mg via ORAL
  Filled 2016-09-07: qty 1

## 2016-09-07 MED ORDER — MAGNESIUM OXIDE 400 (241.3 MG) MG PO TABS: 400.0000 mg | ORAL_TABLET | Freq: Every day | ORAL | 0 refills | Status: DC

## 2016-09-07 MED ORDER — GABAPENTIN 300 MG PO CAPS: 300.0000 mg | ORAL_CAPSULE | Freq: Every day | ORAL | 0 refills | Status: DC

## 2016-09-07 NOTE — ED Provider Notes (Signed)
[facility]-EMERGENCY DEPT Provider Note   CSN: [id number] Arrival date & time: 09/07/16  5916     History   Chief Complaint Chief Complaint  Patient presents with  . Leg Pain    Left    HPI Tanya Owens is a 56 y.o. female.  Pt presents to the ED with left lower leg pain and numbness.  The pt said that she gets pain periodically in both legs.  The pt has been seen for this in the past and was given a rx for Neurontin.  The pt said her doctor told her to take that if she had foot numbness.  So, she stopped taking it.  However, it has helped in the past.  Pt has been referred to ortho, but has not gone due to cost.  She was also told to use wrist splints for possible carpal tunnel, but could not afford the splints.  Pt said the current pain in her left leg started last night.  It shoots all the way down her leg.  No sob, no swelling.  She has had a rheumatological work up in the past by pcp that has been normal.      Past Medical History:  Diagnosis Date  . Arthritis 2000  . Diabetes mellitus without complication (HCC) 2005  . Hypercholesteremia 2005  . Hypertension 2005  . Sleep apnea 2010    Patient Active Problem List   Diagnosis Date Noted  . Bilateral carpal tunnel syndrome 08/25/2016  . Polyarthralgia 04/27/2016  . Obesity (BMI 30-39.9) 11/28/2014  . Type 2 diabetes mellitus without complication (HCC) 11/28/2014  . HTN (hypertension) 06/21/2013  . Mixed hyperlipidemia 06/21/2013  . Morbid obesity (HCC) 06/21/2013    Past Surgical History:  Procedure Laterality Date  . CARDIAC CATHETERIZATION  10/18/2007   Normal coronaries, systemic hypertension    OB History    No data available       Home Medications    Prior to Admission medications   Medication Sig Start Date End Date Taking? Authorizing Provider  amLODipine (NORVASC) 10 MG tablet Take 1 tablet (10 mg total) by mouth daily. 08/19/16   Leland Her, DO  atorvastatin (LIPITOR) 40 MG tablet take 1 tablet by  mouth once daily 08/19/16   Leland Her, DO  gabapentin (NEURONTIN) 300 MG capsule Take 1 capsule (300 mg total) by mouth at bedtime. 09/07/16   Jacalyn Lefevre, MD  hydrochlorothiazide (HYDRODIURIL) 25 MG tablet Take 1 tablet (25 mg total) by mouth daily. 08/19/16   Elsia Rodolph Bong, DO  ibuprofen (ADVIL,MOTRIN) 800 MG tablet Take 1 tablet (800 mg total) by mouth every 6 (six) hours as needed. 08/26/16   Elsia Rodolph Bong, DO  magnesium oxide (MAG-OX) 400 (241.3 Mg) MG tablet Take 1 tablet (400 mg total) by mouth daily. 09/07/16   Jacalyn Lefevre, MD  metFORMIN (GLUCOPHAGE) 1000 MG tablet Take 1 tablet (1,000 mg total) by mouth 2 (two) times daily with a meal. 08/19/16   Leland Her, DO  metoprolol succinate (TOPROL-XL) 50 MG 24 hr tablet Take 1 tablet (50 mg total) by mouth daily. Take with or immediately following a meal. 08/19/16   Leland Her, DO    Family History Family History  Problem Relation Age of Onset  . Heart attack Mother   . Diabetes Mother   . Hypercholesterolemia Mother   . Hypertension Mother   . Diabetes Sister   . Hypertension Brother     Social History Social History  Substance Use Topics  . Smoking status: Former Smoker    Quit date: 12/27/2012  . Smokeless tobacco: Never Used  . Alcohol use No     Allergies   Patient has no known allergies.   Review of Systems Review of Systems  Musculoskeletal:       Left leg pain  All other systems reviewed and are negative.    Physical Exam Updated Vital Signs BP (!) 149/87 (BP Location: Left Arm)   Pulse 81   Temp 98 F (36.7 C) (Oral)   Resp 17   Ht 5\' 9"  (1.753 m)   Wt 253 lb (114.8 kg)   SpO2 100%   BMI 37.36 kg/m   Physical Exam  Constitutional: She is oriented to person, place, and time. She appears well-developed and well-nourished.  HENT:  Head: Normocephalic and atraumatic.  Right Ear: External ear normal.  Left Ear: External ear normal.  Nose: Nose normal.  Mouth/Throat: Oropharynx is clear and moist.  Eyes:  Conjunctivae and EOM are normal. Pupils are equal, round, and reactive to light.  Neck: Normal range of motion. Neck supple.  Cardiovascular: Normal rate, regular rhythm, normal heart sounds and intact distal pulses.   Pulmonary/Chest: Effort normal and breath sounds normal.  Abdominal: Soft. Bowel sounds are normal.  Musculoskeletal: Normal range of motion.  Neurological: She is alert and oriented to person, place, and time.  Skin: Skin is warm and dry.  Psychiatric: She has a normal mood and affect. Her behavior is normal. Judgment and thought content normal.  Nursing note and vitals reviewed.    ED Treatments / Results  Labs (all labs ordered are listed, but only abnormal results are displayed) Labs Reviewed  BASIC METABOLIC PANEL - Abnormal; Notable for the following:       Result Value   Glucose, Bld 114 (*)    All other components within normal limits  CBC WITH DIFFERENTIAL/PLATELET - Abnormal; Notable for the following:    Hemoglobin 11.9 (*)    HCT 35.0 (*)    MCV 75.6 (*)    MCH 25.7 (*)    All other components within normal limits  MAGNESIUM - Abnormal; Notable for the following:    Magnesium 1.6 (*)    All other components within normal limits    EKG  EKG Interpretation None       Radiology No results found.  Procedures Procedures (including critical care time)  Medications Ordered in ED Medications  magnesium oxide (MAG-OX) tablet 400 mg (not administered)  gabapentin (NEURONTIN) capsule 300 mg (300 mg Oral Given 09/07/16 0933)  ketorolac (TORADOL) 30 MG/ML injection 30 mg (30 mg Intramuscular Given 09/07/16 0933)     Initial Impression / Assessment and Plan / ED Course  I have reviewed the triage vital signs and the nursing notes.  Pertinent labs & imaging results that were available during my care of the patient were reviewed by me and considered in my medical decision making (see chart for details).    Magnesium is low, so pt will be started on  magnesium.  She is also told to start the neurontin again as that helped in the past.  She knows to return if worse and to f/u with pcp.  Final Clinical Impressions(s) / ED Diagnoses   Final diagnoses:  Leg cramps  Hypomagnesemia    New Prescriptions New Prescriptions   GABAPENTIN (NEURONTIN) 300 MG CAPSULE    Take 1 capsule (300 mg total) by mouth at bedtime.   MAGNESIUM  OXIDE (MAG-OX) 400 (241.3 MG) MG TABLET    Take 1 tablet (400 mg total) by mouth daily.     Jacalyn Lefevre, MD 09/07/16 1046

## 2016-09-07 NOTE — ED Triage Notes (Signed)
Patient reports intermittent left leg pain starting 6-7 months ago. Patient reports cramping to left lower leg. Patient ambulatory to triage. Pt has been seen for this leg pain 2 other times. Patient also reports right arm pain starting yesterday. Denies injury/trauma to either area.

## 2016-09-18 ENCOUNTER — Ambulatory Visit: Payer: Self-pay | Admitting: Family Medicine

## 2016-09-22 ENCOUNTER — Ambulatory Visit: Payer: Self-pay | Admitting: Family Medicine

## 2016-09-23 ENCOUNTER — Encounter: Payer: Self-pay | Admitting: Family Medicine

## 2016-09-23 ENCOUNTER — Ambulatory Visit (INDEPENDENT_AMBULATORY_CARE_PROVIDER_SITE_OTHER): Payer: Self-pay | Admitting: Family Medicine

## 2016-09-23 ENCOUNTER — Telehealth: Payer: Self-pay | Admitting: *Deleted

## 2016-09-23 VITALS — BP 142/83 | HR 80 | Temp 98.5°F | Wt 251.0 lb

## 2016-09-23 DIAGNOSIS — I10 Essential (primary) hypertension: Secondary | ICD-10-CM

## 2016-09-23 DIAGNOSIS — M2550 Pain in unspecified joint: Secondary | ICD-10-CM

## 2016-09-23 DIAGNOSIS — G8929 Other chronic pain: Secondary | ICD-10-CM

## 2016-09-23 DIAGNOSIS — E119 Type 2 diabetes mellitus without complications: Secondary | ICD-10-CM

## 2016-09-23 LAB — POCT UA - MICROALBUMIN
CREATININE, POC: 200 mg/dL
MICROALBUMIN (UR) POC: 80 mg/L

## 2016-09-23 MED ORDER — METOPROLOL TARTRATE 50 MG PO TABS: 50.0000 mg | ORAL_TABLET | Freq: Two times a day (BID) | ORAL | 0 refills | Status: DC

## 2016-09-23 MED ORDER — METOPROLOL SUCCINATE ER 50 MG PO TB24: 50.0000 mg | ORAL_TABLET | Freq: Every day | ORAL | 2 refills | Status: DC

## 2016-09-23 MED ORDER — GABAPENTIN 300 MG PO CAPS: 300.0000 mg | ORAL_CAPSULE | Freq: Every day | ORAL | 0 refills | Status: DC

## 2016-09-23 MED ORDER — AMLODIPINE BESYLATE 10 MG PO TABS: 10.0000 mg | ORAL_TABLET | Freq: Every day | ORAL | 2 refills | Status: DC

## 2016-09-23 MED ORDER — HYDROCHLOROTHIAZIDE 25 MG PO TABS: 25.0000 mg | ORAL_TABLET | Freq: Every day | ORAL | 2 refills | Status: DC

## 2016-09-23 MED ORDER — METFORMIN HCL 1000 MG PO TABS: 1000.0000 mg | ORAL_TABLET | Freq: Two times a day (BID) | ORAL | 3 refills | Status: DC

## 2016-09-23 MED ORDER — ATORVASTATIN CALCIUM 40 MG PO TABS: ORAL_TABLET | ORAL | 2 refills | Status: DC

## 2016-09-23 NOTE — Progress Notes (Signed)
    Subjective:  Tanya Owens is a 56 y.o. female who presents to the [facility] today for to follow up on her joint pain and her diabetes.  HPI:  Joint pain - Patient would like to discuss a sports medicine referral since she has continued to have chronic joint pain in her shoulders, knees, wrists/hands. Constant, dull aching. Today is particularly worse in her L shoulder and in her R hand. Not worsening, still able to perform her daily activities, in fact has starting exercising recently with regular walking. No weakness. Not worse with use/activity. Has tried NSAIDs without relief.   Diabetes - Taking metformin 1000mg  BID, compliant all of the time, tolerating well. - Further diabetic ROS: no polyuria or polydipsia, no chest pain, dyspnea or TIA's, no numbness, tingling or pain in extremities, no unusual visual symptoms, no hypoglycemia, no medication side effects noted.   - Would like diabetic eye exam referral today. - Needs refills on all her medications  ROS: Per HPI  Objective:  Physical Exam: BP (!) 142/83   Pulse 80   Temp 98.5 F (36.9 C) (Oral)   Wt 251 lb (113.9 kg)   SpO2 98%   BMI 37.07 kg/m   Gen: NAD, resting comfortably CV: RRR with no murmurs appreciated Pulm: NWOB, CTAB with no crackles, wheezes, or rhonchi GI: Normal bowel sounds present. Soft, Nontender, Nondistended. MSK: no edema, cyanosis, or clubbing noted Skin: warm, dry Neuro: grossly normal, moves all extremities Psych: Normal affect and thought content  Results for orders placed or performed in visit on 09/23/16 (from the past 72 hour(s))  POCT UA - Microalbumin     Status: Abnormal   Collection Time: 09/23/16  2:32 PM  Result Value Ref Range   Microalbumin Ur, POC 80 mg/L   Creatinine, POC 200 mg/dL   Albumin/Creatinine Ratio, Urine, POC 30-300      Assessment/Plan:  Chronic joint pain Suspect likely osteoarthritis given long standing history but is not usage related and patient did not  endorse morning stiffness. Negative for rheumatoid arthritis. Since has failed conservative management with NSAIDs, will refer to sports medicine.  Type 2 diabetes mellitus without complication Controlled and doing well on current regiment of metformin 1000mg  BID. Patient has started to make good lifestyle changes with starting to exercise by walking. Encouraged efforts. - Continue metformin - Checked urine microalbumin today, will discuss starting ACEi/ARB at next visit when discussing HTN - Referral to [facility] for diabetic eye exam.  Refills given today. Patient to follow up in 4 weeks to discuss HTN.  11/23/16, DO PGY-1, [facility] 09/23/2016 2:12 PM

## 2016-09-23 NOTE — Assessment & Plan Note (Signed)
Suspect likely osteoarthritis given long standing history but is not usage related and patient did not endorse morning stiffness. Negative for rheumatoid arthritis. Since has failed conservative management with NSAIDs, will refer to sports medicine.

## 2016-09-23 NOTE — Assessment & Plan Note (Signed)
Controlled and doing well on current regiment of metformin 1000mg  BID. Patient has started to make good lifestyle changes with starting to exercise by walking. Encouraged efforts. - Continue metformin - Checked urine microalbumin today, will discuss starting ACEi/ARB at next visit when discussing HTN - Referral to [facility] for diabetic eye exam.

## 2016-09-23 NOTE — Telephone Encounter (Signed)
Rx sent 

## 2016-09-23 NOTE — Telephone Encounter (Signed)
Received fax from [facility] stating they do not carry metoprolol succinate. Please change to metoprolol tartrate 50 mg.  Please send in new Rx.  Clovis Pu, RN

## 2016-09-23 NOTE — Patient Instructions (Signed)
It was great to see you again!  For your diabetes,  - Keep taking your metformin - We will check a urine microalbumin today - Someone will call your about setting up a diabetic eye exam  For your joint pain, - Please take over the counter ibuprofen and tylenol as needed, you can alternate.  - Someone will call you about seeing sports medicine  I will see you back in 1 month to talk about your blood pressure  We are checking some labs today, and someone will call you or send you a letter with the results when they are available.   Take care and seek immediate care sooner if you develop any concerns.   Dr. Leland Her, DO [facility]

## 2016-10-21 ENCOUNTER — Telehealth: Payer: Self-pay | Admitting: Family Medicine

## 2016-10-21 DIAGNOSIS — M2550 Pain in unspecified joint: Secondary | ICD-10-CM

## 2016-10-21 NOTE — Telephone Encounter (Signed)
Pt needs refill on gabapentin, ibuprofen, metoprolol-[facility] program   Metformin needs a refill and needs to be sent to the [facility] on [address]

## 2016-10-22 MED ORDER — IBUPROFEN 800 MG PO TABS: 800.0000 mg | ORAL_TABLET | Freq: Four times a day (QID) | ORAL | 0 refills | Status: DC | PRN

## 2016-10-22 MED ORDER — METFORMIN HCL 1000 MG PO TABS: 1000.0000 mg | ORAL_TABLET | Freq: Two times a day (BID) | ORAL | 3 refills | Status: DC

## 2016-10-22 MED ORDER — METOPROLOL TARTRATE 50 MG PO TABS: 50.0000 mg | ORAL_TABLET | Freq: Two times a day (BID) | ORAL | 0 refills | Status: DC

## 2016-10-22 MED ORDER — GABAPENTIN 300 MG PO CAPS: 300.0000 mg | ORAL_CAPSULE | Freq: Every day | ORAL | 0 refills | Status: DC

## 2016-10-22 NOTE — Telephone Encounter (Signed)
Rx refilled.

## 2016-10-26 ENCOUNTER — Other Ambulatory Visit (HOSPITAL_COMMUNITY)
Admission: RE | Admit: 2016-10-26 | Discharge: 2016-10-26 | Disposition: A | Discharge status: Home / Self Care | Payer: No Typology Code available for payment source | Source: Ambulatory Visit | Attending: Family Medicine | Admitting: Family Medicine

## 2016-10-26 ENCOUNTER — Ambulatory Visit: Payer: Self-pay | Admitting: Family Medicine

## 2016-10-26 ENCOUNTER — Encounter: Payer: Self-pay | Admitting: Family Medicine

## 2016-10-26 ENCOUNTER — Ambulatory Visit (INDEPENDENT_AMBULATORY_CARE_PROVIDER_SITE_OTHER): Payer: Self-pay | Admitting: Family Medicine

## 2016-10-26 VITALS — BP 144/86 | HR 88 | Temp 98.3°F | Wt 256.0 lb

## 2016-10-26 DIAGNOSIS — M2550 Pain in unspecified joint: Secondary | ICD-10-CM

## 2016-10-26 DIAGNOSIS — I10 Essential (primary) hypertension: Secondary | ICD-10-CM

## 2016-10-26 DIAGNOSIS — Z124 Encounter for screening for malignant neoplasm of cervix: Secondary | ICD-10-CM | POA: Insufficient documentation

## 2016-10-26 DIAGNOSIS — G8929 Other chronic pain: Secondary | ICD-10-CM

## 2016-10-26 MED ORDER — LISINOPRIL 20 MG PO TABS: 20.0000 mg | ORAL_TABLET | Freq: Every day | ORAL | 0 refills | Status: DC

## 2016-10-26 NOTE — Progress Notes (Signed)
    Subjective:  Tanya Owens is a 56 y.o. female who presents to the [facility] today for BP follow up.   HPI:  Hypertension  - On norvasc 10mg  qd, HCTZ 25mg  qd, lopressor 50mg  BID. Taking medications as instructed, no medication side effects noted. Last took this am - Patient does not perform home BP monitoring - No HA, vision changes, TIA's, no chest pain on exertion, no dyspnea on exertion and no swelling of ankles.   Joint pain - Continues to have joint pain that is not worsening - Looking forward to seeing sports medicine but did not know when her appointment was.  Health maintenance - Needs DM eye exam, lost list of resources provided - Agreeable to PPSV23 but does not have insurance - Would like pap today - Did not get colonoscopy because was scared so she did not go to appointment. Would like later this year but not now.    ROS: Per HPI  Objective:  Physical Exam: BP (!) 144/86   Pulse 88   Temp 98.3 F (36.8 C) (Oral)   Wt 256 lb (116.1 kg)   SpO2 99%   BMI 37.80 kg/m   Gen: NAD, resting comfortably CV: RRR with no murmurs appreciated Pulm: NWOB, CTAB with no crackles, wheezes, or rhonchi GI: Normal bowel sounds present. Soft, Nontender, Nondistended. GU: normal female. No lesions or discharge. MSK: no edema, cyanosis, or clubbing noted Skin: warm, dry Neuro: grossly normal, moves all extremities Psych: Normal affect and thought content   Assessment/Plan:  HTN (hypertension) Given patient urine microalbumin positive, needs ACEi for renal protection. - Continue norvasc and HCTZ - Discontinue lopressor, start lisinopril 20mg  qd. Suspect will need to titrate up - Instructed patient to check BP at home or at pharmacy - Follow up in 4 weeks for BP recheck and will check BMP at that time  Chronic joint pain Informed patient that has appointment scheduled for 10/28/16 at 2:30pm.   Health maintenance - Pap collected today - Instructed patient to call health  department about pneumonia vaccine. - Will follow up on resending DM eye exam resources but also informed patient that could try [facility]  , DO PGY-1, [facility] 10/26/2016 4:03 PM

## 2016-10-26 NOTE — Assessment & Plan Note (Signed)
Given patient urine microalbumin positive, needs ACEi for renal protection. - Continue norvasc and HCTZ - Discontinue lopressor, start lisinopril 20mg  qd. Suspect will need to titrate up - Instructed patient to check BP at home or at pharmacy - Follow up in 4 weeks for BP recheck and will check BMP at that time

## 2016-10-26 NOTE — Assessment & Plan Note (Signed)
Informed patient that has appointment scheduled for 10/28/16 at 2:30pm.

## 2016-10-26 NOTE — Patient Instructions (Addendum)
It was great to see you again!  For your blood pressure, - Taper off metoprolol: take once a day for the next 2-3 days before stopping - Start lisinopril 20mg  1 tablet daily. You can check your blood pressure at the pharmacy or at home. - Come back in 4 weeks to labs.  For your diabetes, - I have asked Tia if she can re-send you the list of resources for your diabete eye exam. Another option may be [facility] on [address]. - Call the health department [phone] to see if you can get the pneumonia vaccine PPSV23 there.  We are checking a pap today, and someone will call you or send you a letter with the results when they are available. Please give [phone] a call if you do not hear from [place] after 2 weeks.  Take care,  Dr. [place], DO [facility]

## 2016-10-28 ENCOUNTER — Ambulatory Visit
Admission: RE | Admit: 2016-10-28 | Discharge: 2016-10-28 | Disposition: A | Discharge status: Home / Self Care | Payer: No Typology Code available for payment source | Source: Ambulatory Visit | Attending: Student | Admitting: Student

## 2016-10-28 ENCOUNTER — Ambulatory Visit (INDEPENDENT_AMBULATORY_CARE_PROVIDER_SITE_OTHER): Payer: Self-pay | Admitting: Student

## 2016-10-28 ENCOUNTER — Encounter: Payer: Self-pay | Admitting: Student

## 2016-10-28 VITALS — BP 144/86 | Ht 69.0 in | Wt 252.0 lb

## 2016-10-28 DIAGNOSIS — M79641 Pain in right hand: Secondary | ICD-10-CM

## 2016-10-28 DIAGNOSIS — M79642 Pain in left hand: Principal | ICD-10-CM

## 2016-10-28 DIAGNOSIS — R768 Other specified abnormal immunological findings in serum: Secondary | ICD-10-CM | POA: Insufficient documentation

## 2016-10-28 DIAGNOSIS — M7581 Other shoulder lesions, right shoulder: Secondary | ICD-10-CM

## 2016-10-28 DIAGNOSIS — R7989 Other specified abnormal findings of blood chemistry: Secondary | ICD-10-CM | POA: Insufficient documentation

## 2016-10-28 DIAGNOSIS — M7582 Other shoulder lesions, left shoulder: Secondary | ICD-10-CM

## 2016-10-28 MED ORDER — METHYLPREDNISOLONE ACETATE 40 MG/ML IJ SUSP
40.0000 mg | Freq: Once | INTRAMUSCULAR | Status: AC
  Administered 2016-10-28: 40 mg via INTRA_ARTICULAR

## 2016-10-28 MED ORDER — MELOXICAM 15 MG PO TABS: 15.0000 mg | ORAL_TABLET | Freq: Every day | ORAL | 1 refills | Status: DC

## 2016-10-28 NOTE — Progress Notes (Deleted)
  Tanya Owens - 56 y.o. female MRN [id number]  Date of birth: 09/07/1960  SUBJECTIVE:  Including CC & ROS.  CC: ***   Location:  Duration:  Quality:  Severity:  Timing:  Context:  Modifying factors:  Associated signs and symptoms:   ROS: No unexpected weight loss, fever, chills, swelling, instability, muscle pain, numbness/tingling, redness, otherwise see HPI   PMHx - Updated and reviewed.  Contributory factors include: Negative PSHx - Updated and reviewed.  Contributory factors include:  Negative FHx - Updated and reviewed.  Contributory factors include:  Negative Social Hx - Updated and reviewed. Contributory factors include: Negative Medications - reviewed   DATA REVIEWED: ***  PHYSICAL EXAM:  VS: BP:(!) 144/86  HR: bpm  TEMP: ( )  RESP:   HT:5\' 9"  (175.3 cm)   WT:252 lb (114.3 kg)  BMI:37.3 PHYSICAL EXAM: Gen: NAD, alert, cooperative with exam, well-appearing HEENT: clear conjunctiva,  CV:  no edema, capillary refill brisk, normal rate Resp: non-labored Skin: no rashes, normal turgor  Neuro: no gross deficits.  Psych:  alert and oriented ***  ASSESSMENT & PLAN:   No problem-specific Assessment & Plan notes found for this encounter.

## 2016-10-28 NOTE — Assessment & Plan Note (Signed)
Diffuse pain in her bilateral hands. Unable to use prednisone due to diabetes. Placed her on Modicon. Reviewed x-rays which show possibly osteo versus inflammatory arthritis. Because of this, I'll recommend rheumatology referral. Tried to call patient but went to voicemail and voicemail box is full. Would like to assess for seronegative rheumatoid arthritis by rheumatologist.

## 2016-10-28 NOTE — Assessment & Plan Note (Addendum)
Her bilateral rotator cuff tendinitis is waking her up at night. Reviewed risks and benefits of injections including elevation of blood sugar temporarily. Proceeded with bilateral subacromial injections. Gave home exercise program as well. Follow-up in 6 weeks.

## 2016-10-28 NOTE — Progress Notes (Signed)
Tanya Owens - 56 y.o. female MRN [id number]  Date of birth: 04-29-1961  SUBJECTIVE:  Including CC & ROS.  CC: bilateral hand and shoulder pain  Presents with bilateral hand and wrist pain and bilateral shoulder pain. She reports her hand and wrist pain have been ongoing for months. She reports pain diffusely social, dorsal aspect of her wrist and in her finger joints PIP and DIPs. She reports the pain comes and goes and does not seem to be aggravated by anything specific.  She has had a rheumatology workup which was negative.  She complains of bilateral shoulder pain that has been ongoing for the past few months.  She has been taking ibuprofen without relief. He has pain with overhead activities. 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 She is no longer working.  She denies any numbness or tingling down her arm. Pain is on the lateral and anterior aspect of her arm. Denies neck pain.  Denies any acute injury.   ROS: No unexpected weight loss, fever, chills, swelling, instability, muscle pain, numbness/tingling, redness, otherwise see HPI   PMHx - Updated and reviewed.  Contributory factors include: Diabetes mellitus, HTN, obesity PSHx - Updated and reviewed.  Contributory factors include:  Negative FHx - Updated and reviewed.  Contributory factors include:  Negative Social Hx - Updated and reviewed. Contributory factors include: Negative Medications - reviewed   DATA REVIEWED: pcp office notes- polyarthralgia  PHYSICAL EXAM:  VS: BP:(!) 144/86  HR: bpm  TEMP: ( )  RESP:   HT:5\' 9"  (175.3 cm)   WT:252 lb (114.3 kg)  BMI:37.3 PHYSICAL EXAM: Gen: NAD, alert, cooperative with exam, well-appearing HEENT: clear conjunctiva,  CV:  no edema, capillary refill brisk, normal rate Resp: non-labored Skin: no rashes, normal turgor  Neuro: no gross deficits.  Psych:  alert and oriented  Shoulder: Inspection reveals no abnormalities, atrophy or  asymmetry. Palpation is with no tenderness over AC joint or bicipital groove. ROM is full in all planes, but with pain in flexion and abduction and elevates shoulder with these. Rotator cuff strength decreased 2/2 pain bilaterally. + signs of impingement with +Neer and Hawkin's tests, empty can sign bilaterally Speeds and Yergason's tests normal. +painful arc but no drop arm sign. No apprehension sign   Wrist and hand: Mild swelling of PIP/DIP joints, but no erythema noted on inspection. +tenderness to palpation along the carpal bones, metacarpophalangeal joints, PIP joint, DIP joint bilaterally and diffusely Decreased range of motion of wrist at flexion, extension bilaterally.  Has full ROM in supination, pronation, radial deviation, ulnar deviation bilaterally Decreased range of motion at metacarpal phalangeal joints, PIP and DIP joints bilaterally Sensation intact and pulses intact bilaterally   ASSESSMENT & PLAN:   Tendinitis of both rotator cuffs Her bilateral rotator cuff tendinitis is waking her up at night. Reviewed risks and benefits of injections including elevation of blood sugar temporarily. Proceeded with bilateral subacromial injections. Gave home exercise program as well. Follow-up in 6 weeks.  Pain in both hands Diffuse pain in her bilateral hands. Unable to use prednisone due to diabetes. Placed her on Modicon. Reviewed x-rays which show possibly osteo versus inflammatory arthritis. Because of this, I'll recommend rheumatology referral. Tried to call patient but went to voicemail and voicemail box is full. Would like to assess for seronegative rheumatoid arthritis by rheumatologist.  Procedure:  Injection of left subacromial space Consent obtained and verified. Time-out conducted. Noted no overlying erythema, induration,  or other signs of local infection. Skin prepped in a sterile fashion. Topical analgesic spray: Ethyl chloride. Completed without difficulty. Meds:  40 mg depomedrol, 3 cc 1% lidocaine Pain immediately improved suggesting accurate placement of the medication. Advised to call if fevers/chills, erythema, induration, drainage, or persistent bleeding.    Procedure:  Injection of right shoulder Consent obtained and verified. Time-out conducted. Noted no overlying erythema, induration, or other signs of local infection. Skin prepped in a sterile fashion. Topical analgesic spray: Ethyl chloride. Completed without difficulty. Meds: 40 mg depomedrol, 3 cc 1% lidocaine Pain immediately improved suggesting accurate placement of the medication. Advised to call if fevers/chills, erythema, induration, drainage, or persistent bleeding.  Called pt to leave message about x-ray results- personally reviewed bilateral hand x-rays which showed diffuse arthritis throughout her PIP and DIP joints. Bilateral wrist arthritis seen as well. Would recommend referral to rheumatologist to check for seronegative arthropathies. Try to call patient but went to voicemail and voicemail box was full twice.  We'll send message to get a hold up tomorrow. If not, then patient will be sent a letter.

## 2016-10-29 ENCOUNTER — Encounter: Payer: Self-pay | Admitting: Family Medicine

## 2016-10-29 LAB — CYTOLOGY - PAP
Chlamydia: NEGATIVE
Diagnosis: NEGATIVE
HPV: NOT DETECTED
NEISSERIA GONORRHEA: NEGATIVE

## 2016-10-29 NOTE — Addendum Note (Signed)
Addended by: Annita Brod on: 10/29/2016 02:19 PM   Modules accepted: Orders

## 2016-10-29 NOTE — Progress Notes (Signed)
Pt informed of xray results and told her we will be referring her to Dr Tawana Scale office for further testing

## 2016-11-23 ENCOUNTER — Ambulatory Visit: Payer: No Typology Code available for payment source | Admitting: Family Medicine

## 2016-11-25 ENCOUNTER — Other Ambulatory Visit: Payer: Self-pay | Admitting: *Deleted

## 2016-11-25 DIAGNOSIS — M2550 Pain in unspecified joint: Secondary | ICD-10-CM

## 2016-11-25 DIAGNOSIS — I10 Essential (primary) hypertension: Secondary | ICD-10-CM

## 2016-11-25 DIAGNOSIS — E119 Type 2 diabetes mellitus without complications: Secondary | ICD-10-CM

## 2016-11-25 MED ORDER — AMLODIPINE BESYLATE 10 MG PO TABS: 10.0000 mg | ORAL_TABLET | Freq: Every day | ORAL | 2 refills | Status: DC

## 2016-11-25 MED ORDER — ATORVASTATIN CALCIUM 40 MG PO TABS: ORAL_TABLET | ORAL | 2 refills | Status: DC

## 2016-11-25 MED ORDER — GABAPENTIN 300 MG PO CAPS: 300.0000 mg | ORAL_CAPSULE | Freq: Every day | ORAL | 0 refills | Status: DC

## 2016-11-25 MED ORDER — IBUPROFEN 800 MG PO TABS: 800.0000 mg | ORAL_TABLET | Freq: Four times a day (QID) | ORAL | 0 refills | Status: DC | PRN

## 2016-11-25 MED ORDER — LISINOPRIL 20 MG PO TABS: 20.0000 mg | ORAL_TABLET | Freq: Every day | ORAL | 0 refills | Status: DC

## 2016-11-25 NOTE — Telephone Encounter (Signed)
Patient calling to request refill of:  Name of Medication(s):  Lisinopril, ibuprofen, gabapentin, lipitor, norvasc Last date of OV:  10/26/16 Pharmacy:  [facility]  Will route refill request to Clinic RN.  Discussed with patient policy to call pharmacy for future refills.  Also, discussed refills may take up to 48 hours to approve or deny.  Eliseo Gum, [place] [facility]

## 2016-12-03 ENCOUNTER — Encounter (HOSPITAL_COMMUNITY): Payer: Self-pay | Admitting: Emergency Medicine

## 2016-12-03 ENCOUNTER — Emergency Department (HOSPITAL_COMMUNITY)
Admission: EM | Admit: 2016-12-03 | Discharge: 2016-12-03 | Disposition: A | Discharge status: Home / Self Care | Payer: No Typology Code available for payment source | Attending: Emergency Medicine | Admitting: Emergency Medicine

## 2016-12-03 DIAGNOSIS — R2231 Localized swelling, mass and lump, right upper limb: Secondary | ICD-10-CM | POA: Insufficient documentation

## 2016-12-03 DIAGNOSIS — Z5321 Procedure and treatment not carried out due to patient leaving prior to being seen by health care provider: Secondary | ICD-10-CM | POA: Insufficient documentation

## 2016-12-03 NOTE — ED Notes (Signed)
Called from lobby to go back to treatment room but no answer from lobby

## 2016-12-03 NOTE — ED Triage Notes (Signed)
Patient reports having seen her PCP for chronic constant pain. Patient reports having xrays and was told arthritis. Patient c/o worsening swelling in right hand and arm since Sunday.  Patient reports her PCP was supposed to set her up with specialist but hasnt heard back from anyone.

## 2016-12-03 NOTE — ED Notes (Signed)
Patient called for assigned room with no answer.  

## 2016-12-03 NOTE — ED Triage Notes (Signed)
Attempted to call patient for an assigned room with no answer.

## 2016-12-08 ENCOUNTER — Ambulatory Visit: Payer: Self-pay | Admitting: Sports Medicine

## 2016-12-10 ENCOUNTER — Ambulatory Visit: Payer: No Typology Code available for payment source | Admitting: Family Medicine

## 2016-12-10 ENCOUNTER — Telehealth: Payer: Self-pay | Admitting: Family Medicine

## 2016-12-10 NOTE — Telephone Encounter (Signed)
Called patient to discuss frequent no shows. Patient states that her feet hurt that it makes it hard for her to get around but also states that she has been able to make it to the ED for this complaint. Discussed that always a good idea to call and cancel if she cannot make it. Patient asked to be scheduled for appointment tomorrow and voiced good understanding that she should call in if she cannot make her appointments.

## 2016-12-11 ENCOUNTER — Ambulatory Visit (INDEPENDENT_AMBULATORY_CARE_PROVIDER_SITE_OTHER): Payer: No Typology Code available for payment source | Admitting: Family Medicine

## 2016-12-11 ENCOUNTER — Encounter: Payer: Self-pay | Admitting: Family Medicine

## 2016-12-11 VITALS — BP 130/79 | HR 81 | Temp 98.7°F | Wt 258.0 lb

## 2016-12-11 DIAGNOSIS — Z1211 Encounter for screening for malignant neoplasm of colon: Secondary | ICD-10-CM

## 2016-12-11 DIAGNOSIS — I10 Essential (primary) hypertension: Secondary | ICD-10-CM

## 2016-12-11 DIAGNOSIS — M7581 Other shoulder lesions, right shoulder: Secondary | ICD-10-CM

## 2016-12-11 DIAGNOSIS — E119 Type 2 diabetes mellitus without complications: Secondary | ICD-10-CM

## 2016-12-11 DIAGNOSIS — M7582 Other shoulder lesions, left shoulder: Secondary | ICD-10-CM

## 2016-12-11 LAB — POCT GLYCOSYLATED HEMOGLOBIN (HGB A1C): HEMOGLOBIN A1C: 6.6

## 2016-12-11 NOTE — Progress Notes (Signed)
    Subjective:  Tanya Owens is a 56 y.o. female who presents to the [facility] today for follow up on UE pain and diabetes  HPI:  Bilateral UE pain - Has continued to have bilateral hand/wrist pain and shoulder pain chronically for months - Interfering with ADLs. Waxes and wanes. No specific triggers - Saw sports medicine a month ago and had injections which helped greatly but has since worn off - Was told about rheum referral but had not heard back so has been waiting. - Taking lots of ibuprofen, 15-20 pills a day  - No neck pain, numbness, tingling, redness.    Diabetes - Taking metformin 1000mg  BID, compliant all of the time, tolerating well - Has not been able to make DM eye appointment because the phone number she was given seem to be a fax number. - ROS: no polyuria or polydipsia, no chest pain, dyspnea or TIA's, no numbness, tingling or pain in extremities, no unusual visual symptoms, no hypoglycemia, no medication side effects noted.    HTN - Taking norvascq 10mg  qd, HCTZ 25mg  qd, lisinopril 20mg  qd regularly. Tolerating both well. Hypertension ROS: taking medications as instructed, no medication side effects noted, no TIA's, no chest pain on exertion and no dyspnea on exertion.    ROS: Per HPI  PMH: Smoking history reviewed.   Objective:  Physical Exam: BP 130/79   Pulse 81   Temp 98.7 F (37.1 C) (Oral)   Wt 258 lb (117 kg)   SpO2 98%   BMI 38.10 kg/m   Gen: NAD, resting comfortably CV: RRR with no murmurs appreciated Pulm: NWOB, CTAB with no crackles, wheezes, or rhonchi GI: Normal bowel sounds present. Soft, Nontender, Nondistended. MSK: mild swelling of PIP/DIP joints without erythema in B/L hand. Tender to palpation. Decreased ROM at wrists. Skin: warm, dry Neuro: grossly normal, moves all extremities Psych: Normal affect and thought content  Results for orders placed or performed in visit on 12/11/16 (from the past 72 hour(s))  HgB A1c     Status: None    Collection Time: 12/11/16  2:27 PM  Result Value Ref Range   Hemoglobin A1C 6.6      Assessment/Plan:  HTN (hypertension) Controlled. Was started on ACEi on last visit on 10/26/16. - continue norvasc 10mg  qd, HCTZ 25mg  qd, lisinopril 20mg  qd - Check BMP today  Type 2 diabetes mellitus without complication Controlled and doing well. - Continue metformin 1000mg  BID  - unable to refer for optho for DM geye exam given long waitlist currently - patient to try contacting for health department for PCV23   Tendinitis of both rotator cuffs - Discussed regular follow up at [facility] as opposed to ED visits for this concern, patient agreeable.  - Gave patient information on rheum referral that was approved for Dr 12/13/16 office.  - Given patient's significant ibuprofen use, counseled on more appropriate doses. Check BMP today.  Health Maintenance  - Referral placed for colonoscopy.  12/13/16, DO PGY-2, [facility] 12/11/2016 2:33 PM

## 2016-12-11 NOTE — Patient Instructions (Signed)
It was good to see you today!  For your hand and shoulder pain, - Call sports medicine and make a follow up appointment - A referral to rheumatology was placed. Please call  Dr Alben Deeds [address], [address] [place], [place] [address] Phone: [phone]  For your health maintenance, - Call the health department [phone] to see if you can get the pneumonia vaccine PPSV23 there. - Someone will call you to set up a colonoscopy  We are checking some labs today. If results require attention, either myself or my nurse will get in touch with you. If everything is normal, you will get a letter in the mail or a message in My Chart. Please give [place] a call if you do not hear from [place] after 2 weeks.   Please check-out at the front desk before leaving the clinic. Make an appointment in 1 month.   Please bring all of your medications with you to each visit.   Sign up for My Chart to have easy access to your labs results, and communication with your primary care physician.  Feel free to call with any questions or concerns at any time, at [phone].   Take care,  Dr. Leland Her, DO [facility]

## 2016-12-12 LAB — BASIC METABOLIC PANEL
BUN / CREAT RATIO: 14 (ref 9–23)
BUN: 10 mg/dL (ref 6–24)
CO2: 23 mmol/L (ref 20–29)
CREATININE: 0.72 mg/dL (ref 0.57–1.00)
Calcium: 9.5 mg/dL (ref 8.7–10.2)
Chloride: 101 mmol/L (ref 96–106)
GFR, EST AFRICAN AMERICAN: 107 mL/min/{1.73_m2} (ref >59)
GFR, EST NON AFRICAN AMERICAN: 93 mL/min/{1.73_m2} (ref >59)
Glucose: 80 mg/dL (ref 65–99)
Potassium: 4.4 mmol/L (ref 3.5–5.2)
Sodium: 140 mmol/L (ref 134–144)

## 2016-12-12 NOTE — Assessment & Plan Note (Addendum)
-   Discussed regular follow up at [facility] as opposed to ED visits for this concern, patient agreeable.  - Gave patient information on rheum referral that was approved for Dr Shawnee Knapp office.  - Given patient's significant ibuprofen use, counseled on more appropriate doses. Check BMP today.

## 2016-12-12 NOTE — Assessment & Plan Note (Signed)
Controlled. Was started on ACEi on last visit on 10/26/16. - continue norvasc 10mg  qd, HCTZ 25mg  qd, lisinopril 20mg  qd - Check BMP today

## 2016-12-12 NOTE — Assessment & Plan Note (Signed)
Controlled and doing well. - Continue metformin 1000mg  BID  - unable to refer for optho for DM geye exam given long waitlist currently - patient to try contacting for health department for PCV23

## 2016-12-15 ENCOUNTER — Encounter: Payer: Self-pay | Admitting: Family Medicine

## 2016-12-15 DIAGNOSIS — Z5329 Procedure and treatment not carried out because of patient's decision for other reasons: Secondary | ICD-10-CM | POA: Insufficient documentation

## 2016-12-15 DIAGNOSIS — Z91199 Patient's noncompliance with other medical treatment and regimen due to unspecified reason: Secondary | ICD-10-CM

## 2016-12-15 NOTE — Progress Notes (Signed)
Subjective  Patient is presenting with the following illnesses     Chief Complaint noted Review of Symptoms - see HPI PMH - Smoking status noted.     Objective Vital Signs reviewed     Assessments/Plans  No problem-specific Assessment & Plan notes found for this encounter.   See Encounter view if individual problem A/Ps not visible See after visit summary for details of patient instuctions 

## 2017-01-04 ENCOUNTER — Encounter: Payer: Self-pay | Admitting: Family Medicine

## 2017-01-04 ENCOUNTER — Ambulatory Visit (INDEPENDENT_AMBULATORY_CARE_PROVIDER_SITE_OTHER): Payer: No Typology Code available for payment source | Admitting: Family Medicine

## 2017-01-04 DIAGNOSIS — E119 Type 2 diabetes mellitus without complications: Secondary | ICD-10-CM

## 2017-01-04 DIAGNOSIS — M2550 Pain in unspecified joint: Secondary | ICD-10-CM

## 2017-01-04 DIAGNOSIS — I10 Essential (primary) hypertension: Secondary | ICD-10-CM

## 2017-01-04 DIAGNOSIS — G8929 Other chronic pain: Secondary | ICD-10-CM

## 2017-01-04 MED ORDER — ACETAMINOPHEN 325 MG PO TABS: 650.0000 mg | ORAL_TABLET | Freq: Four times a day (QID) | ORAL | Status: DC | PRN

## 2017-01-04 MED ORDER — LISINOPRIL 20 MG PO TABS: 20.0000 mg | ORAL_TABLET | Freq: Every day | ORAL | 0 refills | Status: DC

## 2017-01-04 MED ORDER — AMLODIPINE BESYLATE 10 MG PO TABS: 10.0000 mg | ORAL_TABLET | Freq: Every day | ORAL | 2 refills | Status: DC

## 2017-01-04 MED ORDER — GABAPENTIN 300 MG PO CAPS: 300.0000 mg | ORAL_CAPSULE | Freq: Every day | ORAL | 0 refills | Status: DC

## 2017-01-04 MED ORDER — HYDROCHLOROTHIAZIDE 25 MG PO TABS: 25.0000 mg | ORAL_TABLET | Freq: Every day | ORAL | 2 refills | Status: DC

## 2017-01-04 MED ORDER — METFORMIN HCL 1000 MG PO TABS: 1000.0000 mg | ORAL_TABLET | Freq: Two times a day (BID) | ORAL | 3 refills | Status: DC

## 2017-01-04 MED ORDER — ATORVASTATIN CALCIUM 40 MG PO TABS: ORAL_TABLET | ORAL | 2 refills | Status: DC

## 2017-01-04 MED ORDER — IBUPROFEN 800 MG PO TABS: 800.0000 mg | ORAL_TABLET | Freq: Four times a day (QID) | ORAL | 0 refills | Status: DC | PRN

## 2017-01-04 NOTE — Assessment & Plan Note (Signed)
Discussed follow up with sports medicine and establishing with rheum as planned last visit. Confirmed with patient that has contact information and helped patient enter into her phone today. Reviewed appropriate use of ibuprofen and also recommended adding or alternating with tylenol for greater pain relief.

## 2017-01-04 NOTE — Progress Notes (Signed)
    Subjective:  Tanya Owens is a 56 y.o. female who presents to the [facility] today here for follow up on arm/shoulder pain  HPI:  Bilateral UE pain - No improvement in pain, continues to be significant in bilateral hand/wrist pain and shoulder pain  - Taking ibuprofen regularly. No other medications - Has not followed up with sports medicine or called rheum as instructed last visit because lost the contact information  HTN - Taking norvascq 10mg  qd, HCTZ 25mg  qd, lisinopril 20mg  qd regularly usually but did not take this morning because has not eaten Hypertension ROS: taking medications as instructed, no medication side effects noted, no TIA's, no chest pain on exertion and no dyspnea on exertion.     ROS: Per HPI  Objective:  Physical Exam: BP (!) 152/88   Pulse 71   Temp 98.5 F (36.9 C) (Oral)   Wt 254 lb (115.2 kg)   SpO2 99%   BMI 37.51 kg/m   Gen: NAD, resting comfortably CV: RRR with no murmurs appreciated Pulm: NWOB, CTAB with no crackles, wheezes, or rhonchi GI: Normal bowel sounds present. Soft, Nontender, Nondistended. MSK: mild swelling of PIP/DIP joints without erythema in both hands, holding stiffly.  Skin: warm, dry Neuro: grossly normal, moves all extremities Psych: Normal affect and thought content   Assessment/Plan:  Chronic joint pain Discussed follow up with sports medicine and establishing with rheum as planned last visit. Confirmed with patient that has contact information and helped patient enter into her phone today. Reviewed appropriate use of ibuprofen and also recommended adding or alternating with tylenol for greater pain relief.  HTN (hypertension) Uncontrolled today but patient had not taking her antihypertensives this morning. Was controlled at last visit with the addition of ACEi so no changes to medications today and will continue to monitor for now.   , DO PGY-2, [facility] 01/04/2017 11:10 AM

## 2017-01-04 NOTE — Patient Instructions (Signed)
It was good to see you today!  For your arm/shoulder pain - Call sports medicine for a follow up appointment [phone] - Call rheumatology  Dr Alben Deeds [address], [address] [place], [place] [address] Phone: [phone]   Please check-out at the front desk before leaving the clinic. Make an appointment in  1 month.  Please bring all of your medications with you to each visit.   Sign up for My Chart to have easy access to your labs results, and communication with your primary care physician.  Feel free to call with any questions or concerns at any time, at [phone].   Take care,  Dr. Leland Her, DO [facility]

## 2017-01-04 NOTE — Assessment & Plan Note (Signed)
Uncontrolled today but patient had not taking her antihypertensives this morning. Was controlled at last visit with the addition of ACEi so no changes to medications today and will continue to monitor for now.

## 2017-01-08 ENCOUNTER — Other Ambulatory Visit: Payer: Self-pay | Admitting: *Deleted

## 2017-01-08 DIAGNOSIS — E119 Type 2 diabetes mellitus without complications: Secondary | ICD-10-CM

## 2017-01-08 DIAGNOSIS — M2550 Pain in unspecified joint: Secondary | ICD-10-CM

## 2017-01-08 DIAGNOSIS — I10 Essential (primary) hypertension: Secondary | ICD-10-CM

## 2017-01-08 MED ORDER — GABAPENTIN 300 MG PO CAPS: 300.0000 mg | ORAL_CAPSULE | Freq: Every day | ORAL | 0 refills | Status: DC

## 2017-01-08 MED ORDER — METFORMIN HCL 1000 MG PO TABS: 1000.0000 mg | ORAL_TABLET | Freq: Two times a day (BID) | ORAL | 3 refills | Status: DC

## 2017-01-08 MED ORDER — ATORVASTATIN CALCIUM 40 MG PO TABS: ORAL_TABLET | ORAL | 2 refills | Status: DC

## 2017-01-08 MED ORDER — LISINOPRIL 20 MG PO TABS: 20.0000 mg | ORAL_TABLET | Freq: Every day | ORAL | 0 refills | Status: DC

## 2017-01-08 MED ORDER — MELOXICAM 15 MG PO TABS: 15.0000 mg | ORAL_TABLET | Freq: Every day | ORAL | 1 refills | Status: DC

## 2017-01-08 MED ORDER — IBUPROFEN 800 MG PO TABS: 800.0000 mg | ORAL_TABLET | Freq: Four times a day (QID) | ORAL | 0 refills | Status: DC | PRN

## 2017-01-08 MED ORDER — HYDROCHLOROTHIAZIDE 25 MG PO TABS: 25.0000 mg | ORAL_TABLET | Freq: Every day | ORAL | 2 refills | Status: DC

## 2017-01-08 MED ORDER — AMLODIPINE BESYLATE 10 MG PO TABS: 10.0000 mg | ORAL_TABLET | Freq: Every day | ORAL | 2 refills | Status: DC

## 2017-01-08 NOTE — Telephone Encounter (Signed)
Received message on nurse line from patient requesting all meds be sent to [facility] as they were too expensive for her at [facility]. Kinnie Feil, RN, BSN

## 2017-01-12 ENCOUNTER — Encounter (HOSPITAL_COMMUNITY): Payer: Self-pay | Admitting: Emergency Medicine

## 2017-01-12 DIAGNOSIS — R2233 Localized swelling, mass and lump, upper limb, bilateral: Secondary | ICD-10-CM | POA: Insufficient documentation

## 2017-01-12 DIAGNOSIS — Z5321 Procedure and treatment not carried out due to patient leaving prior to being seen by health care provider: Secondary | ICD-10-CM | POA: Insufficient documentation

## 2017-01-12 NOTE — ED Triage Notes (Signed)
Pt presents with bilat hand, leg, and feet swelling ongoing since 6 months ago; pt states she was seen here for same and was given pain medication but now wants xrays and other diagnostics to identify exactly what it is; pt states the swelling is causing pain in her hands and wrists as well as calves feel like bad cramps

## 2017-01-12 NOTE — ED Notes (Signed)
Writer called for reassessing vitals, no response 

## 2017-01-13 ENCOUNTER — Emergency Department (HOSPITAL_COMMUNITY)
Admission: EM | Admit: 2017-01-13 | Discharge: 2017-01-13 | Discharge status: Against Medical Advice | Payer: No Typology Code available for payment source | Attending: Emergency Medicine | Admitting: Emergency Medicine

## 2017-01-13 NOTE — ED Notes (Signed)
Patient no answer x2 when called for room.

## 2017-01-14 ENCOUNTER — Other Ambulatory Visit: Payer: Self-pay | Admitting: Family Medicine

## 2017-01-14 ENCOUNTER — Ambulatory Visit (INDEPENDENT_AMBULATORY_CARE_PROVIDER_SITE_OTHER): Payer: Self-pay | Admitting: Sports Medicine

## 2017-01-14 ENCOUNTER — Ambulatory Visit
Admission: RE | Admit: 2017-01-14 | Discharge: 2017-01-14 | Disposition: A | Discharge status: Home / Self Care | Payer: No Typology Code available for payment source | Source: Ambulatory Visit | Attending: Sports Medicine | Admitting: Sports Medicine

## 2017-01-14 VITALS — BP 140/92 | Ht 69.0 in | Wt 252.0 lb

## 2017-01-14 DIAGNOSIS — E119 Type 2 diabetes mellitus without complications: Secondary | ICD-10-CM

## 2017-01-14 DIAGNOSIS — I10 Essential (primary) hypertension: Secondary | ICD-10-CM

## 2017-01-14 DIAGNOSIS — M2550 Pain in unspecified joint: Secondary | ICD-10-CM

## 2017-01-14 DIAGNOSIS — M25562 Pain in left knee: Secondary | ICD-10-CM

## 2017-01-14 DIAGNOSIS — M25561 Pain in right knee: Secondary | ICD-10-CM

## 2017-01-14 NOTE — Telephone Encounter (Signed)
Her prescriptions are much higher at [facility] so she would like to change back to the [facility] program please.  Can you call and let her know how to get that done please asap?  Best number to call today [phone].

## 2017-01-14 NOTE — Telephone Encounter (Signed)
Request sent to MD to resend medications. Jazmin Hartsell,CMA

## 2017-01-15 MED ORDER — MELOXICAM 15 MG PO TABS: 15.0000 mg | ORAL_TABLET | Freq: Every day | ORAL | 1 refills | Status: DC

## 2017-01-15 MED ORDER — GABAPENTIN 300 MG PO CAPS: 300.0000 mg | ORAL_CAPSULE | Freq: Every day | ORAL | 0 refills | Status: DC

## 2017-01-15 MED ORDER — IBUPROFEN 800 MG PO TABS: 800.0000 mg | ORAL_TABLET | Freq: Four times a day (QID) | ORAL | 0 refills | Status: DC | PRN

## 2017-01-15 MED ORDER — LISINOPRIL 20 MG PO TABS: 20.0000 mg | ORAL_TABLET | Freq: Every day | ORAL | 0 refills | Status: DC

## 2017-01-15 MED ORDER — METFORMIN HCL 1000 MG PO TABS: 1000.0000 mg | ORAL_TABLET | Freq: Two times a day (BID) | ORAL | 3 refills | Status: DC

## 2017-01-15 MED ORDER — HYDROCHLOROTHIAZIDE 25 MG PO TABS: 25.0000 mg | ORAL_TABLET | Freq: Every day | ORAL | 2 refills | Status: DC

## 2017-01-15 MED ORDER — ATORVASTATIN CALCIUM 40 MG PO TABS: ORAL_TABLET | ORAL | 2 refills | Status: DC

## 2017-01-15 MED ORDER — AMLODIPINE BESYLATE 10 MG PO TABS: 10.0000 mg | ORAL_TABLET | Freq: Every day | ORAL | 2 refills | Status: DC

## 2017-01-15 NOTE — Progress Notes (Signed)
   Subjective:    Patient ID: Tanya Owens, female    DOB: 17-Jun-1960, 56 y.o.   MRN: [id number]  HPI chief complaint: Bilateral hand and bilateral knee pain  Patient comes in today complaining of bilateral hand pain and swelling. She was last seen in the office back in June. She saw Dr. Zachery Dauer. Dr. Zachery Dauer ordered x-rays of her hands. These x-rays show some degenerative changes diffusely at the PIP and DIP joints of both hands. Her pain is long-standing. Dr. Zachery Dauer had suggested proceeding with blood work to rule out inflammatory arthropathy but was unable to contact the patient via telephone. Patient denies fevers or chills. She states that the knee pain is diffuse. Intermittent swelling. No imaging of her knees has been done.    Review of Systems As above    Objective:   Physical Exam  Obese. No acute distress.  Examination of both hands shows mild soft tissue swelling bilaterally. She does have difficulty making a fist. No erythema. Joints are not warm to touch. Good pulses.  Examination of both knees shows limited range of motion bilaterally. 1+ boggy synovitis. Diffuse tenderness to palpation. Good ligamentous stability. Neurovascularly intact distally. Walking with an antalgic gait.  X-rays of both hands are as above. 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 Please note that these x-rays were obtained after the patient left the office.      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 This will include a CBC, CMP, sedimentation rate, C-reactive protein, ANA, rheumatoid factor, and anti-CCP. Patient's x-rays of her knees were done after she left the office so we will contact her with those results. We will schedule a follow-up in about 3 weeks. I will discuss her  blood work with her via telephone in the meantime and we could consider injecting each knee with cortisone at follow-up if needed. Note that a previous rheumatologic workup did not show any evidence of inflammatory arthropathy but I think this is worth repeating.

## 2017-01-21 ENCOUNTER — Encounter: Payer: Self-pay | Admitting: Sports Medicine

## 2017-01-21 ENCOUNTER — Telehealth: Payer: Self-pay | Admitting: Sports Medicine

## 2017-01-21 ENCOUNTER — Other Ambulatory Visit: Payer: Self-pay | Admitting: Family Medicine

## 2017-01-21 NOTE — Telephone Encounter (Signed)
If patient is speaking about her rheumatology referral, (previous message not did not specify what referral patient was calling about), we did not referred her. She was referred by Sports Medicine. She can either contact their office to see what is another option or make an appt to see her PCP. Please note that there are no rheumatologist that accept the orange card.

## 2017-01-21 NOTE — Telephone Encounter (Signed)
Pt is calling because the last referral we gave her does not take the orange card. She is still in a lot of pain and the ibuprofen is not helping. She need to find out what is wrong so that she can get the proper care. jw

## 2017-01-21 NOTE — Telephone Encounter (Signed)
Pt contacted and informed of need to follow up with sports medicine. Pt informed of no rheumatologist in area that accept orange card. Pt voiced understanding.

## 2017-01-21 NOTE — Telephone Encounter (Signed)
Addendum: Rheumatology is refusing to see the patient. Therefore, I think the patient would be best served by returning to her primary care physician to discuss possible treatment for rheumatoid arthritis.

## 2017-01-21 NOTE — Telephone Encounter (Signed)
I reviewed x-rays of this patient's knees as well as blood work. X-rays show advanced medial compartmental DJD. Her blood work shows a markedly elevated anti-CCP (greater than 250). Elevated sedimentation rate and an abnormal ANA as well. Based on these findings I recommend evaluation with rheumatology. I could also inject her knees with cortisone to help with her knee pain if she would like.

## 2017-01-21 NOTE — Telephone Encounter (Signed)
Patient definitely needs to follow up with sports medicine for this as they just repeated all of her bloodwork.

## 2017-01-22 ENCOUNTER — Ambulatory Visit: Payer: No Typology Code available for payment source | Admitting: Family Medicine

## 2017-01-29 ENCOUNTER — Ambulatory Visit: Payer: No Typology Code available for payment source | Admitting: Family Medicine

## 2017-02-01 ENCOUNTER — Ambulatory Visit (INDEPENDENT_AMBULATORY_CARE_PROVIDER_SITE_OTHER): Payer: No Typology Code available for payment source | Admitting: Family Medicine

## 2017-02-01 ENCOUNTER — Encounter: Payer: Self-pay | Admitting: Family Medicine

## 2017-02-01 VITALS — BP 148/80 | HR 76 | Temp 98.5°F | Wt 255.0 lb

## 2017-02-01 DIAGNOSIS — M069 Rheumatoid arthritis, unspecified: Secondary | ICD-10-CM

## 2017-02-01 MED ORDER — METHYLPREDNISOLONE ACETATE 40 MG/ML IJ SUSP
40.0000 mg | Freq: Once | INTRAMUSCULAR | Status: AC
  Administered 2017-02-01: 40 mg via INTRAMUSCULAR

## 2017-02-01 MED ORDER — METHYLPREDNISOLONE ACETATE 80 MG/ML IJ SUSP: 80.0000 mg | Freq: Once | INTRAMUSCULAR | Status: DC

## 2017-02-01 NOTE — Patient Instructions (Signed)
It was good to see you today!  For your rheumatoid arthritis, - I will await the lab results on my side and then call you regarding starting medication and when to come back for blood work  We did knee injections in both knees today.  Please check-out at the front desk before leaving the clinic. Make an appointment in  6 weeks for diabetes follow up.   Please bring all of your medications with you to each visit.   Sign up for My Chart to have easy access to your labs results, and communication with your primary care physician.  Feel free to call with any questions or concerns at any time, at [phone].   Take care,  Dr. Leland Her, DO [facility]   Post-Injection Inflammatory Reaction A post-injection inflammatory reaction is swelling, irritation, and other problems that can develop after a person gets a shot (injection). The reaction can develop at and around the injection site or far away from the injection site. In some cases, it can develop right away and last for a short period of time. In other cases, it can develop weeks after the injection and last for several hours or days. What are the causes? This condition may be caused by:  An allergy.  A response by your body's defense system (immune response).  Damage to the surrounding tissue from the injection.  Germs that enter the body through the injection site.  What are the signs or symptoms? Symptoms of this condition include:  Itchiness.  Redness.  Rash.  Warmth.  Swelling.  Tenderness.  Pain.  Fever or chills.  Muscle aches.  Nausea or vomiting.  Loss of appetite.  Headache.  Dizziness.  In severe cases, seizures, asthma, or a severe allergic reaction (anaphylaxis) can occur. These cases are rare. How is this diagnosed? This condition may be diagnosed with a physical exam. During the exam, a circle may be drawn around the injection site. The circle helps to show whether  redness in the area is spreading. How is this treated? Treatment for this condition depends on what caused the reaction and how severe the reaction is. Treatments commonly involves:  Putting an ice pack over the injection site.  Taking a nonsteroidal anti-inflammatory drug (NSAID) to reduce swelling and itching.  Taking an antibiotic medicine.  Taking pain medicine.  If the reaction affects a joint, you may also need to rest that joint for a while. Follow these instructions at home:  Keep the injection site clean.  Take over-the-counter and prescription medicines only as told by your health care provider.  If you were prescribed antibiotic medicine, take or apply it as told by your health care provider. Do not stop taking or applying the antibiotic even if your condition improves.  If directed, apply ice to the injured area: ? Put ice in a plastic bag. ? Place a towel between your skin and the bag. ? Leave the ice on for 20 minutes, 2-3 times per day.  If the reaction affects a joint, rest your joint. Ask your health care provider when you can start to use your joint again.  Do not drive or operate heavy machinery while taking prescription pain medicine.  Keep all follow-up visits as told by your health care provider. This is important. Contact a health care provider if:  Your symptoms last for several hours.  You develop a fever or chills.  You develop muscle aches. Get help right away if:  Your symptoms  get worse.  You have trouble breathing.  You have seizures. This information is not intended to replace advice given to you by your health care provider. Make sure you discuss any questions you have with your health care provider. Document Released: 01/14/2011 Document Revised: 10/10/2015 Document Reviewed: 11/14/2014 Elsevier Interactive Patient Education  [employer].

## 2017-02-01 NOTE — Assessment & Plan Note (Addendum)
Per telephone encounter from sports medicine, patient has had positive blood work for rheumatoid arthritis however cannot view results in epic today. Will get lab to forward results before starting on a DMARD. Discussed with patient that I would call when labs result to discuss starting methotrexate as well as when to have blood work for monitoring. Patient was counseled on benefits vs risks of starting methotrexate and voiced good understanding of this, she is agreeable to this medication and willing to come in for frequent blood work. Since per Dr. Gilmer Mor telephone note mentioned b/l knee injections, offered to patient today. Please see below for procedure note.

## 2017-02-01 NOTE — Progress Notes (Signed)
    Subjective:  Tanya Owens is a 56 y.o. female who presents to the [facility] today follow-up on arthritis  HPI:  Arthritis - States was told by a sports medicine that has severe rheumatoid arthritis based on lab work and to follow-up with PCP for care - Has had continued bilateral hand pain and swelling in her PIP and DIP joints bilateral hands, worse in the morning with stiffness that is long-standing -Also has pain in her other joints such as her bilateral knees with associated calf cramping bilaterally, and in her shoulders.   ROS: Per HPI  Objective:  Physical Exam: BP (!) 148/80   Pulse 76   Temp 98.5 F (36.9 C) (Oral)   Wt 255 lb (115.7 kg)   SpO2 98%   BMI 37.66 kg/m   Gen: NAD, resting comfortably CV: RRR with no murmurs appreciated. Distal pulses intact bilaterally radial and dorsalis pedis.  Pulm: NWOB, CTAB with no crackles, wheezes, or rhonchi GI: Normal bowel sounds present. Soft, Nontender, Nondistended. MSK: Mild swelling of bilateral hands tingling over PIP joints with reduced range of motion in making a fist, no erythema.  Skin: warm, dry Neuro: grossly normal, moves all extremities Psych: Normal affect and thought content    Assessment/Plan:  Rheumatoid arthritis (HCC) Per telephone encounter from sports medicine, patient has had positive blood work for rheumatoid arthritis however cannot view results in epic today. Will get lab to forward results before starting on a DMARD. Discussed with patient that I would call when labs result to discuss starting methotrexate as well as when to have blood work for monitoring. Patient was counseled on benefits vs risks of starting methotrexate and voiced good understanding of this, she is agreeable to this medication and willing to come in for frequent blood work. Since per Dr. Gilmer Mor telephone note mentioned b/l knee injections, offered to patient today. Please see below for procedure note.    INJECTION: Patient  was given informed consent, signed copy in the chart. Appropriate time out was taken. Area prepped and draped in usual sterile fashion. 0.5 cc of methylprednisolone 80 mg/ml plus  4 cc of 1% lidocaine without epinephrine was injected into the R knee joint using a(n) medial approach. Additionally, 0.5 cc of methylprednisolone 80 mg/ml plus  4 cc of 1% lidocaine without epinephrine was injected into the L knee joint using a(n) medial approach.  The patient tolerated the procedure well. There were no complications. Post procedure instructions were given.  Leland Her, DO PGY-2, [facility] 02/01/2017 2:06 PM

## 2017-02-17 ENCOUNTER — Telehealth: Payer: Self-pay

## 2017-02-17 ENCOUNTER — Encounter: Payer: Self-pay | Admitting: Family Medicine

## 2017-02-17 ENCOUNTER — Ambulatory Visit (INDEPENDENT_AMBULATORY_CARE_PROVIDER_SITE_OTHER): Payer: Self-pay | Admitting: Family Medicine

## 2017-02-17 VITALS — BP 178/87 | Ht 69.0 in | Wt 252.0 lb

## 2017-02-17 DIAGNOSIS — E119 Type 2 diabetes mellitus without complications: Secondary | ICD-10-CM

## 2017-02-17 DIAGNOSIS — M06041 Rheumatoid arthritis without rheumatoid factor, right hand: Secondary | ICD-10-CM

## 2017-02-17 DIAGNOSIS — M2550 Pain in unspecified joint: Secondary | ICD-10-CM

## 2017-02-17 DIAGNOSIS — R252 Cramp and spasm: Secondary | ICD-10-CM

## 2017-02-17 DIAGNOSIS — M06042 Rheumatoid arthritis without rheumatoid factor, left hand: Principal | ICD-10-CM

## 2017-02-17 DIAGNOSIS — I10 Essential (primary) hypertension: Secondary | ICD-10-CM

## 2017-02-17 NOTE — Telephone Encounter (Signed)
Yes I had discussed with patient about starting methotrexate at last visit with me. When I precepted, was advised to wait for lab results confirming rheumatoid arthritis to be pulled in to our system as can only see that they have been collected. Will await these lab results and appreciate any assistance getting these results.

## 2017-02-17 NOTE — Telephone Encounter (Signed)
Checking with lab staff to see when and if these labs were drawn.  Orders were only placed on 8-30 and today 02-17-17 by sports medicine providers. Jazmin Hartsell,CMA

## 2017-02-17 NOTE — Progress Notes (Signed)
Chief complaint: Bilateral diffuse leg cramps  History of present illness: Tanya Owens is a 56 year old female who presents to the sports medicine office today with chief complaint of diffuse leg cramps in both of her legs. She describes the pain in the both of her calves. She reports that this is been ongoing for the last 8 to 9 months. She does not report of any inciting incident, trauma, or injury to explain the pain. When she was last here in the sports medicine office back on 01/14/17, she had diffuse bilateral hand pain and bilateral knee pain. She also had diffuse bilateral shoulder pain. The concern given her symptom presentation was from an inflammatory arthropathy such as rheumatoid arthritis. Blood work was obtained to include a CBC, CMP, ESR, CRP, ANA, rheumatoid factor, anti-CCP. Her blood work showed markedly elevated anti-CCP and elevated sedimentation rate, as well as an abnormal ANA. Unfortunately, she is not able to be seen by rheumatology based on insurance reasons. It was recommended that she followed up with her primary physician be started on DMARD medication. At her primary physician appointment back on 02/01/17, she did have cortisone injection into this of her knees. She does report improvement in symptoms for approximately 1-2 weeks. She does not report of any specific knee pain today, just reports pain in her calves, specifically a cramping pain. She does not report of any unilateral swelling, warmth, erythema, ecchymosis. She does not report of any recent surgical procedure, long distance travel, long periods of immobilization, or personal or family history of VTE. It was discussed for her at her primary physician's office to be started on methotrexate pending lab results, but she reports that this has not been initiated. Her physician has been waiting on lab results before initiation. She reports for the diffuse pain that she is having she has been taking "25 tablets of ibuprofen daily".  She says this is the OTC dose. She reports that this has not been helping her pain. Of note, she was seen in the emergency department earlier this year for leg cramps, was found to have hypomagnesemia, was placed on magnesium replacement. She has not had repeat magnesium level since that time.  Review of systems:  As stated above  Interval past medical history, surgical history, family history, and social history obtained and unchanged.  Physical exam: Vital signs are reviewed and are documented in the chart Gen.: Alert, oriented, appears stated age, in no apparent distress HEENT: Moist oral mucosa Respiratory: Normal respirations, able to speak in full sentences Cardiac: Regular rate, distal pulses 2+ Integumentary: No rashes on visible skin:  Neurologic: Strength 5/5, sensation 2+ in bilateral lower extremities Psych: Normal affect, mood is described as good Musculoskeletal: Inspection of bilateral calves reveal no obvious deformity or muscle atrophy, no warmth, erythema, ecchymosis, or effusion noted, no tenderness to palpation over the calf muscles, both are soft, she does walk with an antalgic gait, has good knee ligamentous stability, she is neurovascularly intact in bilateral upper and lower extremities, her knee range of motion bilaterally is from 0 to 120  Assessment and plan: 1. Bilateral calf cramping, with history of hypomagnesemia 2. History of suspected rheumatoid arthritis based on physical exam findings and labwork 3. NSAID overuse  Bilateral calf cramping -Her calves are soft and nontender, no unilateral swelling, warmth, erythema, ecchymosis, very low suspicion for DVT and no risk factors otherwise for DVT, reassured her that that does not appear to be case for DVT -Discussed no obvious physical exam findings to  be concerned for any pathology regarding the muscles of the gastrocnemius and soleus -I have concerns that this may be due to electrolyte imbalance, given her  history of hypomagnesemia -Will have repeat lab work for be ordered today to include CBC with differential, CMP, magnesium level, to rule out any type of restless leg syndrome and iron deficiency, given that she did have a low MCV, will order for ferritin level as well as iron panel  Bilateral diffuse shoulder pain, hand pain, and knee pain -Suspect from rheumatoid arthritis -Unfortunately, she has not been started on any DMARD medications.....she has been taking a large amount of ibuprofen, which I told her was not good to do -Will touch base with her primary physician to pass along records indicating that she does have rheumatoid arthritis and that I believe it would be reasonable to start her on methotrexate..I discussed this would need to be started and managed by her primary physician -CMP as above due to large NSAID use and to ensure no nephrotoxicity  Will call patient after lab results. Otherwise, will have her follow up with her primary physician for initiation of methotrexate. Welcome to return to [facility] with any other concerns.    Mort Sawyers, M.D. [place]

## 2017-02-17 NOTE — Telephone Encounter (Signed)
Pt was seen 9/21 and was suppose to be prescribed methotrexate. Pt was seen down at sports medicine and this was brought to Dr. Sandre Kitty attention, he would prefer her pcp prescribe and manage. Please advise.

## 2017-02-17 NOTE — Telephone Encounter (Signed)
Per our lab staff, labs ordered from sports medicine no longer drop into Epic and they will have to be faxed over from their office.  Will send message to [facility] so we can get a copy of these labs for PCP.

## 2017-02-18 ENCOUNTER — Telehealth: Payer: Self-pay | Admitting: Family Medicine

## 2017-02-18 DIAGNOSIS — Z79899 Other long term (current) drug therapy: Secondary | ICD-10-CM

## 2017-02-18 DIAGNOSIS — Z79631 Long term (current) use of antimetabolite agent: Secondary | ICD-10-CM

## 2017-02-18 MED ORDER — PREDNISONE 5 MG PO TABS: 5.0000 mg | ORAL_TABLET | Freq: Every day | ORAL | 0 refills | Status: DC

## 2017-02-18 MED ORDER — METFORMIN HCL 1000 MG PO TABS: 1000.0000 mg | ORAL_TABLET | Freq: Two times a day (BID) | ORAL | 3 refills | Status: DC

## 2017-02-18 MED ORDER — LISINOPRIL 20 MG PO TABS: 20.0000 mg | ORAL_TABLET | Freq: Every day | ORAL | 0 refills | Status: DC

## 2017-02-18 MED ORDER — HYDROCHLOROTHIAZIDE 25 MG PO TABS: 25.0000 mg | ORAL_TABLET | Freq: Every day | ORAL | 2 refills | Status: DC

## 2017-02-18 MED ORDER — METHOTREXATE SODIUM 10 MG PO TABS: 10.0000 mg | ORAL_TABLET | ORAL | 1 refills | Status: DC

## 2017-02-18 MED ORDER — GABAPENTIN 300 MG PO CAPS: 300.0000 mg | ORAL_CAPSULE | Freq: Every day | ORAL | 0 refills | Status: DC

## 2017-02-18 MED ORDER — ATORVASTATIN CALCIUM 40 MG PO TABS: ORAL_TABLET | ORAL | 2 refills | Status: DC

## 2017-02-18 MED ORDER — AMLODIPINE BESYLATE 10 MG PO TABS: 10.0000 mg | ORAL_TABLET | Freq: Every day | ORAL | 2 refills | Status: DC

## 2017-02-18 MED ORDER — FOLIC ACID 1 MG PO TABS: 1.0000 mg | ORAL_TABLET | Freq: Every day | ORAL | 3 refills | Status: DC

## 2017-02-18 NOTE — Addendum Note (Signed)
Addended by: Leland Her on: 02/18/2017 08:55 AM   Modules accepted: Orders

## 2017-02-18 NOTE — Telephone Encounter (Signed)
Called patient to discuss that being on methotrexate, she needs to start folic acid supplementation. Sent to patient pharmacy.

## 2017-02-18 NOTE — Telephone Encounter (Addendum)
Received staff message that labs viewable under media. CCP >250 high. + ANA and ESR 50.  Called patient to discuss starting DMARD. Discussed plan as below:  Start methotrexate 44m per week with target dose 167mweek. Plan to increase by 2.5-14m42mn monthly intervals until reaches max 214m63mek. With daily folic acid supplementation.   Monitoring with CBC, LFTs, albumin, creatinine.  - q2-4weeks for the first 3 months or if dose changed  - q8-12weeks for months 3-6  - then q12 weeks thereafter  Oral glucocorticoids while initiating DMARD however patient is diabetic so will need to pay close attention to sugars. Prednisone 14mg 414m day with goal to taper quicky as possible after successful DMARD therapy.  Also discussed appropriate NSAID use as patient has been taking excessive amounts of ibuprofen. Recommended that full therapeutic antiinflam dose is ibuprofen 3200mg 20mhat would be similar to 600mg q24mrn, no more than that. She voiced good understanding.  All questions were answered and patient has appointment with me on 03/11/17 for follow up on medications and to check bloodwork.

## 2017-03-11 ENCOUNTER — Ambulatory Visit: Payer: No Typology Code available for payment source | Admitting: Family Medicine

## 2017-03-11 ENCOUNTER — Ambulatory Visit (INDEPENDENT_AMBULATORY_CARE_PROVIDER_SITE_OTHER): Payer: Self-pay | Admitting: Family Medicine

## 2017-03-11 ENCOUNTER — Encounter: Payer: Self-pay | Admitting: Family Medicine

## 2017-03-11 VITALS — BP 130/78 | HR 73 | Temp 98.3°F | Resp 18 | Ht 68.9 in | Wt 255.8 lb

## 2017-03-11 DIAGNOSIS — E119 Type 2 diabetes mellitus without complications: Secondary | ICD-10-CM

## 2017-03-11 DIAGNOSIS — Z79631 Long term (current) use of antimetabolite agent: Secondary | ICD-10-CM

## 2017-03-11 DIAGNOSIS — Z79899 Other long term (current) drug therapy: Secondary | ICD-10-CM

## 2017-03-11 DIAGNOSIS — M06041 Rheumatoid arthritis without rheumatoid factor, right hand: Secondary | ICD-10-CM

## 2017-03-11 DIAGNOSIS — M06042 Rheumatoid arthritis without rheumatoid factor, left hand: Secondary | ICD-10-CM

## 2017-03-11 DIAGNOSIS — I10 Essential (primary) hypertension: Secondary | ICD-10-CM

## 2017-03-11 MED ORDER — PREDNISONE 5 MG PO TABS: 5.0000 mg | ORAL_TABLET | Freq: Every day | ORAL | 0 refills | Status: DC

## 2017-03-11 MED ORDER — METFORMIN HCL 1000 MG PO TABS: 1000.0000 mg | ORAL_TABLET | Freq: Two times a day (BID) | ORAL | 1 refills | Status: DC

## 2017-03-11 MED ORDER — HYDROCHLOROTHIAZIDE 25 MG PO TABS: 25.0000 mg | ORAL_TABLET | Freq: Every day | ORAL | 1 refills | Status: DC

## 2017-03-11 MED ORDER — FOLIC ACID 1 MG PO TABS: 1.0000 mg | ORAL_TABLET | Freq: Every day | ORAL | 3 refills | Status: DC

## 2017-03-11 MED ORDER — AMLODIPINE BESYLATE 10 MG PO TABS: 10.0000 mg | ORAL_TABLET | Freq: Every day | ORAL | 1 refills | Status: DC

## 2017-03-11 MED ORDER — METHOTREXATE SODIUM 10 MG PO TABS: 10.0000 mg | ORAL_TABLET | ORAL | 1 refills | Status: DC

## 2017-03-11 MED ORDER — ATORVASTATIN CALCIUM 40 MG PO TABS: ORAL_TABLET | ORAL | 1 refills | Status: DC

## 2017-03-11 MED ORDER — LISINOPRIL 20 MG PO TABS: 20.0000 mg | ORAL_TABLET | Freq: Every day | ORAL | 1 refills | Status: DC

## 2017-03-11 NOTE — Patient Instructions (Addendum)
It was good to see you today!  For your rheumatoid arthritis - Start methotrexate 10mg  once a week. You need to take daily folic acid. Being on this medication means that you need bloodwork every 2 to 4 weeks for the first 3 months. - Start prednisone 5mg  daily to bring down the inflammation in your hands while the methotrexate kicks in. I hope you dont need to be on this more than a few months because it can make your sugars high.   Please check-out at the front desk before leaving the clinic. Make 3 appointments:  1. Appointment with for orange card 2. Appointment with me in 2 weeks for arthritis follow up 3. Lab appointment in 6 weeks for bloodwork   Please bring all of your medications with you to each visit so we can make sure that there are no errors.   Sign up for My Chart to have easy access to your labs results, and communication with your primary care physician.  Feel free to call with any questions or concerns at any time, at [phone].   Take care,  Dr. Annice Pih, DO [facility]

## 2017-03-11 NOTE — Progress Notes (Signed)
    Subjective:  Tanya Owens is a 56 y.o. female who presents to the [facility] today for rheumatoid arthritis  HPI:  Rheumatoid arthritis - had not picked up her methotrexate or prednisone, only taking folic acid - thought that gabapentin was the medication to treat her rheumatoid arthritis - still having pain and stiffness and swelling, no improvement  ROS: Per HPI  Objective:  Physical Exam: BP 130/78 (BP Location: Left Arm, Patient Position: Sitting, Cuff Size: Normal)   Pulse 73   Temp 98.3 F (36.8 C) (Oral)   Resp 18   Ht 5' 8.9" (1.75 m)   Wt 255 lb 12.8 oz (116 kg)   SpO2 99%   BMI 37.89 kg/m   Gen: NAD, resting comfortably MSK: swelling of bilateral hands most prominent over PIP joints with reduced range of motion and TTP. Skin: warm, dry Neuro: grossly normal, moves all extremities Psych: Normal affect and thought content   Assessment/Plan:  Rheumatoid arthritis (HCC) Despite efforts to communicate over the phone, patient had not picked up the correct medications and has not yet started DMARD. Discussed medication regiment at length today with teach back to ensure that patient fully understands plan as below. Also discussed bringing all of her medications to each visit to eliminate some of the persistent communication errors. - Start methotrexate 10mg  once a week with daily folic acid. Will check CBC, CMP every 2 to 4 weeks for the first 3 months or with dose adjustments - Start prednisone 5mg  daily while initiating DMARD, will hopefully only need to be on for a few months with max being 4-20months. - Follow up in 2 weeks     , DO PGY-2, [facility] 03/11/2017 9:34 AM

## 2017-03-11 NOTE — Assessment & Plan Note (Signed)
Despite efforts to communicate over the phone, patient had not picked up the correct medications and has not yet started DMARD. Discussed medication regiment at length today with teach back to ensure that patient fully understands plan as below. Also discussed bringing all of her medications to each visit to eliminate some of the persistent communication errors. - Start methotrexate 10mg  once a week with daily folic acid. Will check CBC, CMP every 2 to 4 weeks for the first 3 months or with dose adjustments - Start prednisone 5mg  daily while initiating DMARD, will hopefully only need to be on for a few months with max being 4-2months. - Follow up in 2 weeks

## 2017-03-17 ENCOUNTER — Telehealth: Payer: Self-pay | Admitting: *Deleted

## 2017-03-17 NOTE — Telephone Encounter (Signed)
Called pharmacy and clarified that patient should not be metoprolol. Reviewed patient medication list. Pharmacy informed me that while they carry all of the patient's other current medication that they had to call in methotrexate and folic acid to [facility] for patient.

## 2017-03-17 NOTE — Telephone Encounter (Signed)
Received message on nurse line from [facility]  [phone]) asking for clarification on metoprolol. Received faxed rx on 02/25/2017 but thought this med was discontinued. Please clarify. Kinnie Feil, RN, BSN

## 2017-03-23 ENCOUNTER — Ambulatory Visit: Payer: Self-pay | Admitting: Family Medicine

## 2017-03-26 ENCOUNTER — Ambulatory Visit
Admission: RE | Admit: 2017-03-26 | Discharge: 2017-03-26 | Disposition: A | Discharge status: Home / Self Care | Payer: No Typology Code available for payment source | Source: Ambulatory Visit | Attending: Family Medicine | Admitting: Family Medicine

## 2017-03-26 ENCOUNTER — Ambulatory Visit (INDEPENDENT_AMBULATORY_CARE_PROVIDER_SITE_OTHER): Payer: Self-pay | Admitting: Family Medicine

## 2017-03-26 VITALS — BP 160/92 | Ht 69.0 in | Wt 252.0 lb

## 2017-03-26 DIAGNOSIS — M19019 Primary osteoarthritis, unspecified shoulder: Secondary | ICD-10-CM | POA: Insufficient documentation

## 2017-03-26 DIAGNOSIS — M25511 Pain in right shoulder: Principal | ICD-10-CM

## 2017-03-26 DIAGNOSIS — G8929 Other chronic pain: Secondary | ICD-10-CM

## 2017-03-26 DIAGNOSIS — M25512 Pain in left shoulder: Secondary | ICD-10-CM

## 2017-03-26 DIAGNOSIS — M19012 Primary osteoarthritis, left shoulder: Secondary | ICD-10-CM

## 2017-03-26 DIAGNOSIS — M19011 Primary osteoarthritis, right shoulder: Secondary | ICD-10-CM

## 2017-03-26 MED ORDER — METHYLPREDNISOLONE ACETATE 40 MG/ML IJ SUSP
40.0000 mg | Freq: Once | INTRAMUSCULAR | Status: AC
  Administered 2017-03-26: 40 mg via INTRA_ARTICULAR

## 2017-03-26 NOTE — Assessment & Plan Note (Signed)
Think she has multiple issues causing her shoulder pain. Today we injected the left acromioclavicular joint. A see her back in 2-3 weeks. We will use the right side as a control although the left one is more painful for her. Please get some shoulder x-rays were reviewed as well as see her back.

## 2017-03-26 NOTE — Progress Notes (Signed)
Left AC 

## 2017-03-26 NOTE — Progress Notes (Signed)
  Tanya Owens - 56 y.o. female MRN [id number]  Date of birth: [birth year]    SUBJECTIVE:      Chief Complaint:/ HPI:  Bilateral shoulder pain. Long standing. Worse recently. The left shoulders particularly worrisome and she has pain that radiates down into her forearm and her hand. This is going on many many months. Had some type of injection in her right shoulder that did not seem to help.  Shoulder pain a 10. Worse with activity, especially anything where she has to reach forward or cross the body. Also reaching above shoulder level is painful. Right-hand dominant. Nothing seems to make it better. Bothers her at night.   ROS:     No fever. She has multiple other arthralgias. She was recently started on prednisone which is not helping. No unusual weight change. No rash.  PERTINENT  PMH / PSH FH / / SH:  Past Medical, Surgical, Social, and Family History Reviewed & Updated in the EMR.  Pertinent findings include:  History of from his her arthritis per her chart. Recently started on methotrexate and 5 mg of prednisone daily. She does not see much improvement since starting the prednisone. Hypertension  OBJECTIVE: BP (!) 160/92   Ht 5\' 9"  (1.753 m)   Wt 252 lb (114.3 kg)   BMI 37.21 kg/m   Physical Exam:  Vital signs are reviewed. GEN.: Well-developed overweight female no acute distress SHOULDERS: Symmetrical. Pain with forward flexion or abduction above 90. Significant pain with any crossover of the arm in front of her body, left greater than right. Tender to palpation over the acromioclavicular joint and the bicep tendon on both sides. Intact strength in the rotator cuff muscles although exam is limited by pain. Grip strength is normal. VASCULAR: Radial pulses 2+ bilaterally symmetrical NEURO: Intact sensation to soft touch bilaterally in the hands.  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 There is also some lysis noted of the joints bilaterally.  PROCEDURE: INJECTION: Patient was given informed consent, signed copy in the chart. Appropriate time out was taken. Area prepped and draped in usual sterile fashion. Ethyl chloride was  used for local anesthesia. A 21 gauge 1 1/2 inch needle was used.. 1 cc of methylprednisolone 40 mg/ml plus  1 cc of 1% lidocaine without epinephrine was injected into the left acromioclavicular joint using a(n) ultrasound-guided approach.   The patient tolerated the procedure well. There were no complications. Post procedure instructions were given.   ASSESSMENT & PLAN:  See problem based charting & AVS for pt instructions.

## 2017-04-02 ENCOUNTER — Ambulatory Visit: Payer: Medicaid Other | Admitting: Family Medicine

## 2017-04-16 ENCOUNTER — Encounter: Payer: Self-pay | Admitting: Family Medicine

## 2017-04-16 ENCOUNTER — Other Ambulatory Visit: Payer: Self-pay

## 2017-04-16 ENCOUNTER — Ambulatory Visit (INDEPENDENT_AMBULATORY_CARE_PROVIDER_SITE_OTHER): Payer: Self-pay | Admitting: Family Medicine

## 2017-04-16 VITALS — BP 158/78 | HR 80 | Temp 98.2°F | Wt 255.0 lb

## 2017-04-16 DIAGNOSIS — I10 Essential (primary) hypertension: Secondary | ICD-10-CM

## 2017-04-16 DIAGNOSIS — Z79631 Long term (current) use of antimetabolite agent: Secondary | ICD-10-CM

## 2017-04-16 DIAGNOSIS — Z79899 Other long term (current) drug therapy: Secondary | ICD-10-CM

## 2017-04-16 DIAGNOSIS — E119 Type 2 diabetes mellitus without complications: Secondary | ICD-10-CM

## 2017-04-16 DIAGNOSIS — M129 Arthropathy, unspecified: Secondary | ICD-10-CM

## 2017-04-16 DIAGNOSIS — M2550 Pain in unspecified joint: Secondary | ICD-10-CM

## 2017-04-16 LAB — POCT GLYCOSYLATED HEMOGLOBIN (HGB A1C): Hemoglobin A1C: 6.8

## 2017-04-16 MED ORDER — METHOTREXATE 2.5 MG PO TABS: 2.5000 mg | ORAL_TABLET | ORAL | 0 refills | Status: DC

## 2017-04-16 MED ORDER — TRAMADOL HCL 50 MG PO TABS: ORAL_TABLET | ORAL | 0 refills | Status: DC

## 2017-04-16 MED ORDER — FOLIC ACID 1 MG PO TABS: 1.0000 mg | ORAL_TABLET | Freq: Every day | ORAL | 3 refills | Status: DC

## 2017-04-16 MED ORDER — IBUPROFEN 800 MG PO TABS: 800.0000 mg | ORAL_TABLET | Freq: Four times a day (QID) | ORAL | 0 refills | Status: DC | PRN

## 2017-04-16 NOTE — Patient Instructions (Addendum)
Methotrexate 2.5mg  once a week. Folic acid is a vitamin that you must take with methotrexate, take it every day!   Your blood work needs to monitored every 2 to 4 weeks while we are starting methotrexate.   It was good to see you today!  Please bring all of your medications with you to each visit.   Sign up for My Chart to have easy access to your labs results, and communication with your primary care physician.  Feel free to call with any questions or concerns at any time, at [phone].   Take care,  Dr. Leland Her, DO [facility]

## 2017-04-16 NOTE — Patient Instructions (Signed)
Take the methotrexate once a week. Make sure you're taking the folic acid vitamin, every day.  I am giving new a prescription for tramadol. Iet [place]  try it for pain. Then me see you back at sports medicine in a month or you can follow-up with Dr. Artist Pais that family medicine has I will be discussing with her as well.

## 2017-04-16 NOTE — Progress Notes (Signed)
    Subjective:  Tanya Owens is a 56 y.o. female who presents to the [facility] today for follow up of arthritis  HPI:  Rheumatoid arthritis - Was not able to afford methotrexate and now is not sure if she is taking folic acid.  - Did not think to call about asking for help or informing about inability to obtain medication, has been very frustrated because continues to have joint swelling and pain - During sports medicine appt today received and injection and some aid with enough methotrexate for 1 month  Home medications issues - She brought all of her home medications with her today because she has been confused and there is one bottle that is labeled ibuprofen but she is not sure what medication is in that bottle.   Hypertension - taking norvasc 10mg  qd, lisinopril 20mg  qd and she also states that she may have been taking double her HCTZ because she has multiple bottles (2 of them) and thought they were different medications - no CP, SOB, leg edema, or lightheadness  ROS: Per HPI  Objective:  Physical Exam: BP (!) 158/78   Pulse 80   Temp 98.2 F (36.8 C) (Oral)   Wt 255 lb (115.7 kg)   SpO2 98%   BMI 37.66 kg/m   Gen: NAD, resting comfortably MSK: mild swelling of bilateral hands with reduced ROM from pain Skin: warm, dry   Results for orders placed or performed in visit on 04/16/17 (from the past 72 hour(s))  HgB A1c     Status: None   Collection Time: 04/16/17  1:42 PM  Result Value Ref Range   Hemoglobin A1C 6.8      Assessment/Plan:  Arthropathy Patient has had delay in starting DMARD for several reasons including communication issues and financial difficulties. Thankfully, she has been given some aid during her sports medicine appointment earlier today and should be able to start methotrexate 2.5mg  weekly though it was only for a one month supply. She and her daughter were instructed to resume folic acid with this and we discussed that patient should call if she  runs into further issues. I hope that patient's daughter will help close some of the communication difficulties. Follow up in 2 weeks for CBC and CMP.  Encounter for medication management Patient brought all of her home medications with her today with several duplicates (2 bottles of HCTZ, 2 bottles of ibuprofen) and could not tell me how she has been taking her medications. We reviewed her home medications with teach back method and discussed bringing to each visit with repetition in the future.  HTN (hypertension) Elevated today but patient was very uncertain as to how she has been taking her home medications. Will recheck BP in 2 weeks and with bloodwork since very good possibility that she was unintentionally on 50mg  of HCTZ instead of her 25mg  qd.   04/18/17, DO PGY-2, [facility] 04/16/2017 1:53 PM

## 2017-04-16 NOTE — Assessment & Plan Note (Signed)
Very unclear what is going on. She's having a lot of debilitating pain. She was unable to afford that methotrexate. We will try starting her at 2.5 mg every week and she can buy for those pills for $9. She will continue on the folic acid. I will give her some tramadol for use at nighttime. She will follow-up with me or her PCP in one month.

## 2017-04-16 NOTE — Progress Notes (Signed)
    CHIEF COMPLAINT / HPI:  Follow-up shoulder pain. At last office visit I injected her acromioclavicular joint and that helped for about a week. Then the pain returned. She's having a lot of problems sleeping night because of her multiple joint pains. Her PCP is start her on methotrexate but she was unable to afford that. He was greater than $100 at [facility] by her report. She's feeling quite frustrated. Pain is keeping her from doing just about anything. REVIEW OF SYSTEMS: No unusual weight change. No fever.  PERTINENT  PMH / PSH: I have reviewed the patient's medications, allergies, past medical and surgical history, smoking status and updated in the EMR as appropriate.   OBJECTIVE:    GEN.: Well-developed overweight female SHOULDERS: Limited range of motion above 90 in forward flexion or abduction. Passive range of motion is complete but painful. She has some mild swelling of the hands. ASSESSMENT / PLAN: Please see problem oriented charting for details

## 2017-04-19 DIAGNOSIS — Z79899 Other long term (current) drug therapy: Secondary | ICD-10-CM | POA: Insufficient documentation

## 2017-04-19 NOTE — Assessment & Plan Note (Signed)
Patient has had delay in starting DMARD for several reasons including communication issues and financial difficulties. Thankfully, she has been given some aid during her sports medicine appointment earlier today and should be able to start methotrexate 2.5mg  weekly though it was only for a one month supply. She and her daughter were instructed to resume folic acid with this and we discussed that patient should call if she runs into further issues. I hope that patient's daughter will help close some of the communication difficulties. Follow up in 2 weeks for CBC and CMP.

## 2017-04-19 NOTE — Assessment & Plan Note (Signed)
Patient brought all of her home medications with her today with several duplicates (2 bottles of HCTZ, 2 bottles of ibuprofen) and could not tell me how she has been taking her medications. We reviewed her home medications with teach back method and discussed bringing to each visit with repetition in the future.

## 2017-04-19 NOTE — Assessment & Plan Note (Signed)
Elevated today but patient was very uncertain as to how she has been taking her home medications. Will recheck BP in 2 weeks and with bloodwork since very good possibility that she was unintentionally on 50mg  of HCTZ instead of her 25mg  qd.

## 2017-04-29 MED FILL — METHOTREXATE 2.5 MG TABLET: 2.5 | 28 days supply | Qty: 4 | Fill #0

## 2017-04-29 MED FILL — traMADol HCL 50 MG TABS: 50 | 30 days supply | Qty: 60 | Fill #0

## 2017-05-20 ENCOUNTER — Ambulatory Visit: Payer: Self-pay | Admitting: Sports Medicine

## 2017-05-26 ENCOUNTER — Other Ambulatory Visit: Payer: Self-pay | Admitting: Family Medicine

## 2017-05-26 DIAGNOSIS — M2550 Pain in unspecified joint: Secondary | ICD-10-CM

## 2017-05-26 DIAGNOSIS — I10 Essential (primary) hypertension: Secondary | ICD-10-CM

## 2017-05-26 DIAGNOSIS — Z79899 Other long term (current) drug therapy: Secondary | ICD-10-CM

## 2017-05-26 DIAGNOSIS — E119 Type 2 diabetes mellitus without complications: Secondary | ICD-10-CM

## 2017-05-26 DIAGNOSIS — Z79631 Long term (current) use of antimetabolite agent: Secondary | ICD-10-CM

## 2017-05-26 MED ORDER — TRAMADOL HCL 50 MG PO TABS: ORAL_TABLET | ORAL | 0 refills | Status: DC

## 2017-05-26 MED ORDER — LISINOPRIL 20 MG PO TABS: 20.0000 mg | ORAL_TABLET | Freq: Every day | ORAL | 1 refills | Status: DC

## 2017-05-26 MED ORDER — ACETAMINOPHEN 325 MG PO TABS: 650.0000 mg | ORAL_TABLET | Freq: Four times a day (QID) | ORAL | Status: DC | PRN

## 2017-05-26 MED ORDER — GABAPENTIN 300 MG PO CAPS
300.0000 mg | ORAL_CAPSULE | Freq: Every day | ORAL | 0 refills | Status: DC
Start: 2017-05-26 — End: 2017-08-13

## 2017-05-26 MED ORDER — MELOXICAM 15 MG PO TABS: 15.0000 mg | ORAL_TABLET | Freq: Every day | ORAL | 1 refills | Status: DC

## 2017-05-26 MED ORDER — METFORMIN HCL 1000 MG PO TABS: 1000.0000 mg | ORAL_TABLET | Freq: Two times a day (BID) | ORAL | 1 refills | Status: DC

## 2017-05-26 MED ORDER — FOLIC ACID 1 MG PO TABS: 1.0000 mg | ORAL_TABLET | Freq: Every day | ORAL | 3 refills | Status: DC

## 2017-05-26 MED ORDER — IBUPROFEN 800 MG PO TABS: 800.0000 mg | ORAL_TABLET | Freq: Four times a day (QID) | ORAL | 0 refills | Status: DC | PRN

## 2017-05-26 MED ORDER — AMLODIPINE BESYLATE 10 MG PO TABS: 10.0000 mg | ORAL_TABLET | Freq: Every day | ORAL | 1 refills | Status: DC

## 2017-05-26 MED ORDER — ATORVASTATIN CALCIUM 40 MG PO TABS: ORAL_TABLET | ORAL | 1 refills | Status: DC

## 2017-05-26 MED ORDER — HYDROCHLOROTHIAZIDE 25 MG PO TABS: 25.0000 mg | ORAL_TABLET | Freq: Every day | ORAL | 1 refills | Status: DC

## 2017-05-26 NOTE — Telephone Encounter (Signed)
Will forward to MD to advise. Krystofer Hevener,CMA  

## 2017-05-26 NOTE — Telephone Encounter (Signed)
Pt needs refill on all her meds

## 2017-05-28 ENCOUNTER — Telehealth: Payer: Self-pay | Admitting: Family Medicine

## 2017-05-28 ENCOUNTER — Ambulatory Visit (INDEPENDENT_AMBULATORY_CARE_PROVIDER_SITE_OTHER): Payer: Self-pay | Admitting: Sports Medicine

## 2017-05-28 ENCOUNTER — Encounter: Payer: Self-pay | Admitting: Sports Medicine

## 2017-05-28 ENCOUNTER — Ambulatory Visit
Admission: RE | Admit: 2017-05-28 | Discharge: 2017-05-28 | Disposition: A | Discharge status: Home / Self Care | Payer: Medicaid Other | Source: Ambulatory Visit | Attending: Family Medicine | Admitting: Family Medicine

## 2017-05-28 VITALS — BP 184/102 | Ht 69.0 in | Wt 254.0 lb

## 2017-05-28 DIAGNOSIS — M25572 Pain in left ankle and joints of left foot: Principal | ICD-10-CM

## 2017-05-28 DIAGNOSIS — M79642 Pain in left hand: Secondary | ICD-10-CM

## 2017-05-28 DIAGNOSIS — M25571 Pain in right ankle and joints of right foot: Secondary | ICD-10-CM

## 2017-05-28 MED ORDER — METHOTREXATE 2.5 MG PO TABS: 2.5000 mg | ORAL_TABLET | ORAL | 0 refills | Status: DC

## 2017-05-28 NOTE — Progress Notes (Signed)
Chief complaint: Bilateral ankle pain, left index finger pain x 2 months  History of present illness: Tanya Owens is a 57 year old female presents to the sports medicine office today with a few concerns today. She has been seen here in the past for bilateral knee pain, leg cramps, and diffuse generalized arthropathy, most recently for [facility] joint arthropathy. Workup revealed that she does have an inflammatory arthropathy. She was recently started on methotrexate a few months ago, takes 2.5 mg 1 tablet weekly. Most concerning to her today is bilateral ankle pain. She reports that symptoms have been present for approximately 2 months. She does not report of any specific inciting factors, trauma, or injury to explain the pain. She also reports of numbness, tingling, and burning sensation on the dorsal aspect of both of her feet. She reports that the right side is worse than the left side. She does not report of any numbness or tingling elsewhere in her body. She reports any type of standing or walking are aggravating factors. She feels that the pain as sharp, has been getting worse. It is nonradiating. She reports that the pain does wake her up at nighttime. She does not report of any swelling, warmth, erythema, or ecchymosis.  Additionally, she reports having left index finger pain. She reports noticing swelling about a month ago initially on the palmar aspect of the DIP. She reports that she was told by her primary physician that it look like a wart. She reports now the swelling had moved to the dorsal aspect of her DIP. She reports a slight warmth and increased pain. She does not report of any similar areas elsewhere in her hands or fingers.  She reports to me that she did run out of her methotrexate prescription last week, missed last week's dose secondary to being out of prescription. She reports that she has a planned visit with her primary care physician next Wednesday. For chronic pain, she is on tramadol as  well as gabapentin.  Review of systems:  As stated above  Interval past medical history, surgical history, family history, and social history obtained and unchanged.  Physical exam: Vital signs are reviewed and are documented in the chart Gen.: Alert, oriented, appears stated age, in no apparent distress HEENT: Moist oral mucosa Respiratory: Normal respirations, able to speak in full sentences Cardiac: Regular rate, distal pulses 2+ Integumentary: No rashes on visible skin:  Neurologic: Strength 5/5, sensation 2+ in bilateral feet and ankles; strength and sensation is also intact in bilateral hands and wrists Psych: Normal affect, mood is described as good Musculoskeletal: Inspection of both of her ankles reveal no obvious deformity or muscle atrophy, no warmth, erythema, ecchymosis, or effusion, she is diffusely tender to palpation along anterior portion of both of her ankles, specifically over the tibiotalar joint, no tenderness over the medial and lateral malleolus, no tenderness over the distal fibula, does have full ankle range of motion without elicitation of pain, no antalgic gait, anterior drawer and talar tilt negative bilaterally; inspection of left index finger DIP reveals no obvious deformity or muscle atrophy, she does have minimal effusion and warmth noted at the DIP, she is tender palpation over both dorsal and palmar aspect of the DIP, is also tender to palpation over the collaterals, she does have full strength and range of motion of the DIP  Assessment and plan: 1. Bilateral ankle pain, suspect this is related to her systemic inflammatory arthropathy 2. Left index finger DIP pain and swelling, also suspect this is  related to her systemic inflammatory arthropathy  Plan:  She specifically requests x-rays of both of her ankles today, which I do feel is a reasonable request. Given the absence of any trauma, do not suspect this to be any type of specific tendinopathy.  Additionally, given slight swelling and pain in her left index finger at the DIP, will obtain x-ray of her left hand today. Did prescribe one tablet of methotrexate 2.5 mg, discussed to take this today. She will have follow-up visit with her primary physician next week. Discussed I will defer further management in regards to methotrexate and chronic pain medications to her primary physician.   Haynes Kerns, M.D. Primary Care Sports Medicine Fellow [facility]

## 2017-05-28 NOTE — Telephone Encounter (Signed)
Called and spoke with Tanya Owens at 4:20 PM to over x-ray results of her left hand and both of her ankles. Discussed no acute bony abnormalities, discussed diffuse arthropathy in her ankle joints as well as at the left index DIP, no evidence of bony erosion. Discussed next step would be to touch base with her primary physician next week to talk about further management of methotrexate, as I do feel that disease management is the best thing that she can do to try to prevent further progression of her inflammatory arthropathy. I discussed the most severe case ankle fusion would be an option, but I do not think she is at that point to where she would need that. I discussed no additional recommendations, pain control per primary physician. She will follow-up here on as-needed basis. Of note, I did have to call her on 3 different occasions as her phone did disconnect, on last attempt it went straight to her voicemail.  Haynes Kerns, MD Primary Care Sports Medicine Fellow [facility]

## 2017-06-02 ENCOUNTER — Ambulatory Visit (INDEPENDENT_AMBULATORY_CARE_PROVIDER_SITE_OTHER): Payer: Self-pay | Admitting: Family Medicine

## 2017-06-02 ENCOUNTER — Other Ambulatory Visit: Payer: Self-pay

## 2017-06-02 ENCOUNTER — Encounter: Payer: Self-pay | Admitting: Family Medicine

## 2017-06-02 VITALS — BP 132/80 | HR 72 | Temp 98.2°F | Wt 252.0 lb

## 2017-06-02 DIAGNOSIS — M129 Arthropathy, unspecified: Secondary | ICD-10-CM

## 2017-06-02 DIAGNOSIS — M1990 Unspecified osteoarthritis, unspecified site: Secondary | ICD-10-CM

## 2017-06-02 DIAGNOSIS — M06042 Rheumatoid arthritis without rheumatoid factor, left hand: Secondary | ICD-10-CM

## 2017-06-02 DIAGNOSIS — M06041 Rheumatoid arthritis without rheumatoid factor, right hand: Secondary | ICD-10-CM

## 2017-06-02 DIAGNOSIS — M2550 Pain in unspecified joint: Secondary | ICD-10-CM

## 2017-06-02 MED ORDER — PREDNISONE 5 MG PO TABS: 5.0000 mg | ORAL_TABLET | Freq: Every day | ORAL | 0 refills | Status: DC

## 2017-06-02 MED ORDER — METHOTREXATE SODIUM 5 MG PO TABS: 5.0000 mg | ORAL_TABLET | ORAL | 0 refills | Status: DC

## 2017-06-02 MED ORDER — IBUPROFEN 800 MG PO TABS: 800.0000 mg | ORAL_TABLET | Freq: Four times a day (QID) | ORAL | 0 refills | Status: DC | PRN

## 2017-06-02 MED FILL — METHOTREXATE 2.5 MG TABLET: 2.5 | 28 days supply | Qty: 8 | Fill #0

## 2017-06-02 NOTE — Patient Instructions (Signed)
Methotrexate is increased from 2.5mg  weekly to 5mg  weekly. I have given you 4 pills. Keep up the good work of taking your folic acid with this.  Refilled ibuprofen and prednisone today. The prednisone is temporary and the hope is to taper you off of this in a couple of months.  Checking blood work today, if all look good a letter will be sent to your house. If results require attention, either myself or my nurse will get in touch with you.   Come back in 1 month.

## 2017-06-02 NOTE — Progress Notes (Signed)
    Subjective:  Tanya Owens is a 57 y.o. female who presents to the [facility] today for arthritis follow up.   HPI:  Rheumatoid arthritis -Has been able to start methotrexate 2.5mg  q weekly however finished her pills and was not able to call in for refill or come in for her follow-up so has been out of her medication for the past 2 weeks due to being in lots of pain. -Still taking folic acid daily -Has been on prednisone since October -Still having lots of joint pain and swelling, recently saw sports medicine for bilateral foot and ankle pain and was told that she had arthritis in both ankles.  ROS: Per HPI  Objective:  Physical Exam: BP 132/80   Pulse 72   Temp 98.2 F (36.8 C) (Oral)   Wt 252 lb (114.3 kg)   SpO2 98%   BMI 37.21 kg/m   Gen: NAD, resting comfortably MSK: mild swelling of DIPs in bilateral hands. B/l feet and ankle without erythema or edema, intact to monofilament only in her toes but with 2+ DP and PT pulses. Skin: warm, dry Neuro: grossly normal, moves all extremities Psych: Normal affect and thought content   Assessment/Plan:  Arthritis Patient with medication noncompliance and follow up issues, has been off methotrexate for the past 2 weeks but previously had take it for 4-5 weeks. Restart methotrexate at increased dose of 5mg  weekly and given 2 month supply. She is still on daily folic acid supplementation as well as chronic prednisone 5mg  daily that will hopefully be able to be weaned off in the next several months as target dose of methotrexate is reached. CBC and CMP obtained today for monitoring.   , DO PGY-2, [facility] 06/02/2017 11:07 AM

## 2017-06-03 LAB — CMP14+EGFR
A/G RATIO: 1.4 (ref 1.2–2.2)
ALT: 51 IU/L — ABNORMAL HIGH (ref 0–32)
AST: 32 IU/L (ref 0–40)
Albumin: 4.4 g/dL (ref 3.5–5.5)
Alkaline Phosphatase: 105 IU/L (ref 39–117)
BILIRUBIN TOTAL: 0.3 mg/dL (ref 0.0–1.2)
BUN/Creatinine Ratio: 21 (ref 9–23)
BUN: 14 mg/dL (ref 6–24)
CALCIUM: 9.6 mg/dL (ref 8.7–10.2)
CHLORIDE: 100 mmol/L (ref 96–106)
CO2: 23 mmol/L (ref 20–29)
Creatinine, Ser: 0.68 mg/dL (ref 0.57–1.00)
GFR calc non Af Amer: 97 mL/min/{1.73_m2} (ref >59)
GFR, EST AFRICAN AMERICAN: 112 mL/min/{1.73_m2} (ref >59)
GLUCOSE: 101 mg/dL — AB (ref 65–99)
Globulin, Total: 3.2 g/dL (ref 1.5–4.5)
POTASSIUM: 4.4 mmol/L (ref 3.5–5.2)
Sodium: 140 mmol/L (ref 134–144)
TOTAL PROTEIN: 7.6 g/dL (ref 6.0–8.5)

## 2017-06-03 LAB — CBC
HEMOGLOBIN: 11.9 g/dL (ref 11.1–15.9)
Hematocrit: 36.2 % (ref 34.0–46.6)
MCH: 25.4 pg — AB (ref 26.6–33.0)
MCHC: 32.9 g/dL (ref 31.5–35.7)
MCV: 77 fL — ABNORMAL LOW (ref 79–97)
Platelets: 277 10*3/uL (ref 150–379)
RBC: 4.68 x10E6/uL (ref 3.77–5.28)
RDW: 16.7 % — ABNORMAL HIGH (ref 12.3–15.4)
WBC: 7.5 10*3/uL (ref 3.4–10.8)

## 2017-06-03 NOTE — Assessment & Plan Note (Signed)
Patient with medication noncompliance and follow up issues, has been off methotrexate for the past 2 weeks but previously had take it for 4-5 weeks. Restart methotrexate at increased dose of 5mg  weekly and given 2 month supply. She is still on daily folic acid supplementation as well as chronic prednisone 5mg  daily that will hopefully be able to be weaned off in the next several months as target dose of methotrexate is reached. CBC and CMP obtained today for monitoring.

## 2017-06-04 ENCOUNTER — Ambulatory Visit: Payer: Medicaid Other | Admitting: Family Medicine

## 2017-06-14 ENCOUNTER — Telehealth: Payer: Self-pay | Admitting: Family Medicine

## 2017-06-14 DIAGNOSIS — M06041 Rheumatoid arthritis without rheumatoid factor, right hand: Secondary | ICD-10-CM

## 2017-06-14 DIAGNOSIS — M06042 Rheumatoid arthritis without rheumatoid factor, left hand: Secondary | ICD-10-CM

## 2017-06-14 DIAGNOSIS — M129 Arthropathy, unspecified: Secondary | ICD-10-CM

## 2017-06-14 DIAGNOSIS — M2550 Pain in unspecified joint: Secondary | ICD-10-CM

## 2017-06-14 NOTE — Telephone Encounter (Signed)
Pt would like Artist Pais to call her about her test results from her last blood test. She also mentioned her medications being sent back to sports med. Please advise

## 2017-06-14 NOTE — Telephone Encounter (Signed)
Will forward to MD. Bonham Zingale,CMA  

## 2017-06-15 MED ORDER — PREDNISONE 5 MG PO TABS: 5.0000 mg | ORAL_TABLET | Freq: Every day | ORAL | 0 refills | Status: DC

## 2017-06-15 MED ORDER — TRAMADOL HCL 50 MG PO TABS: ORAL_TABLET | ORAL | 0 refills | Status: DC

## 2017-06-15 MED ORDER — IBUPROFEN 800 MG PO TABS: 800.0000 mg | ORAL_TABLET | Freq: Four times a day (QID) | ORAL | 0 refills | Status: DC | PRN

## 2017-06-15 NOTE — Telephone Encounter (Signed)
Patient just started methrotrexate 5mg  weekly. Needs refills on ibuprofen, prednisone, and tramadol. [name] PMP reviewed. Patient counseled on tramadol use and that hopefully can taper off once at target dose on methotrexate.

## 2017-06-25 ENCOUNTER — Ambulatory Visit (INDEPENDENT_AMBULATORY_CARE_PROVIDER_SITE_OTHER): Payer: Self-pay | Admitting: Family Medicine

## 2017-06-25 ENCOUNTER — Encounter: Payer: Self-pay | Admitting: Family Medicine

## 2017-06-25 VITALS — BP 160/90 | Ht 69.0 in | Wt 250.0 lb

## 2017-06-25 DIAGNOSIS — R7989 Other specified abnormal findings of blood chemistry: Secondary | ICD-10-CM

## 2017-06-25 DIAGNOSIS — M2550 Pain in unspecified joint: Secondary | ICD-10-CM

## 2017-06-25 DIAGNOSIS — R768 Other specified abnormal immunological findings in serum: Secondary | ICD-10-CM

## 2017-06-25 DIAGNOSIS — M1380 Other specified arthritis, unspecified site: Secondary | ICD-10-CM

## 2017-06-25 MED ORDER — TRAMADOL HCL 50 MG PO TABS: ORAL_TABLET | ORAL | 5 refills | Status: DC

## 2017-06-25 MED FILL — traMADol HCL 50 MG TABS: 50 | 30 days supply | Qty: 120 | Fill #0

## 2017-06-28 NOTE — Progress Notes (Signed)
  HARSHINI TRENT - 57 y.o. female MRN [id number]  Date of birth: 12/20/1960    SUBJECTIVE:      Chief Complaint:/ HPI:  Multiple joints are painful.  Most recently her elbows have started hurting.  Other joints are continuing to bother her and they include ankles, acromioclavicular, shoulders.  Hand joints particularly DIP are intermittently painful and stiff.  He also notes that sometimes her fingers are warm and they are really hurting.  She has been on methotrexate with folic acid supplementation, gabapentin and tramadol.  Tramadol seems to help a little bit.  Cannot tell much difference at all with the other 2 medicines.   ROS:     No fever.  Multiple arthralgias.  Generalized sense of feeling not well.  No unusual weight change.  Constipation, no diarrhea.  No headaches.  No chest pain.  No shortness of breath with exertion.  No cough.  PERTINENT  PMH / PSH FH / / SH:  Past Medical, Surgical, Social, and Family History Reviewed & Updated in the EMR.  Pertinent findings include:  Hypertension Started methotrexate December 2018.  Was prescribed prednisone in addition to the methotrexate initially.  OBJECTIVE: BP (!) 160/90   Ht 5\' 9"  (1.753 m)   Wt 250 lb (113.4 kg)   BMI 36.92 kg/m   Physical Exam:  Vital signs are reviewed. SHOULDERS: Limited range of motion above 90 in forward flexion or abduction. Passive range of motion is complete but painful. Deformity acromioclavicular joint right greater than left HANDS/FINGERS:  She has some mild swelling of the hands noted on dorsum. TTP at 2nd right DIP.  HIPS: Internal and external rotation is full.  Painless. KNEES: Extension and flexion full range of motion.  Mild pain in the patellar area on the right during Full extension but otherwise movement is painless. SKIN: more warmth noted right 2nd DIP.  ASSESSMENT & PLAN:  See problem based charting & AVS for pt instructions.

## 2017-06-28 NOTE — Assessment & Plan Note (Addendum)
She has some type of arthropathy with underlying rheumatological issues.  Patient needs to be seen by rheumatology.  Lack of insurance has been a problem in getting her set up.  We will try again to get this very nice lady seen by rheumatologist.    In the interim, I would continue the methotrexate.  I will leave it to her PCP to consider a dose increase of that.   I will go ahead and increase her dose of tramadol is having quite a bit of pain.   She is not sure whether the gabapentin is helping or not so her PCP can reevaluate that at next office visit I will send referral today.   I would like to see her back here in 6 weeks just to maintain follow-up so that we do not with this fall through the cracks.  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 She did have quite elevated CPP at one point. LABS 01/21/2017 CCP >250 = High (strong positive >59) RF = 13.2 (0-13.9) ANA direct positive Sed rate 50 (0-40) CRP 3.3 (0-4.9)

## 2017-06-30 ENCOUNTER — Ambulatory Visit: Payer: Medicaid Other | Admitting: Family Medicine

## 2017-06-30 NOTE — Patient Instructions (Signed)
Someone from [facility] will be calling you to setup financial counseling (since you only have the [facility] letter and they dont accept that) before they can schedule an appt. Their office number is [phone].

## 2017-07-02 ENCOUNTER — Ambulatory Visit (INDEPENDENT_AMBULATORY_CARE_PROVIDER_SITE_OTHER): Payer: Self-pay | Admitting: Family Medicine

## 2017-07-02 ENCOUNTER — Other Ambulatory Visit: Payer: Self-pay

## 2017-07-02 ENCOUNTER — Encounter: Payer: Self-pay | Admitting: Family Medicine

## 2017-07-02 VITALS — BP 120/70 | HR 79 | Temp 98.5°F | Wt 246.0 lb

## 2017-07-02 DIAGNOSIS — R7989 Other specified abnormal findings of blood chemistry: Secondary | ICD-10-CM

## 2017-07-02 DIAGNOSIS — R768 Other specified abnormal immunological findings in serum: Secondary | ICD-10-CM

## 2017-07-02 DIAGNOSIS — Z79899 Other long term (current) drug therapy: Secondary | ICD-10-CM

## 2017-07-02 DIAGNOSIS — M129 Arthropathy, unspecified: Secondary | ICD-10-CM

## 2017-07-02 DIAGNOSIS — M06041 Rheumatoid arthritis without rheumatoid factor, right hand: Secondary | ICD-10-CM

## 2017-07-02 DIAGNOSIS — Z79631 Long term (current) use of antimetabolite agent: Secondary | ICD-10-CM

## 2017-07-02 DIAGNOSIS — E782 Mixed hyperlipidemia: Secondary | ICD-10-CM

## 2017-07-02 DIAGNOSIS — I10 Essential (primary) hypertension: Secondary | ICD-10-CM

## 2017-07-02 DIAGNOSIS — M06042 Rheumatoid arthritis without rheumatoid factor, left hand: Secondary | ICD-10-CM

## 2017-07-02 DIAGNOSIS — E119 Type 2 diabetes mellitus without complications: Secondary | ICD-10-CM

## 2017-07-02 MED ORDER — LISINOPRIL 20 MG PO TABS: 20.0000 mg | ORAL_TABLET | Freq: Every day | ORAL | 1 refills | Status: DC

## 2017-07-02 MED ORDER — AMLODIPINE BESYLATE 10 MG PO TABS: 10.0000 mg | ORAL_TABLET | Freq: Every day | ORAL | 1 refills | Status: DC

## 2017-07-02 MED ORDER — METFORMIN HCL 1000 MG PO TABS: 1000.0000 mg | ORAL_TABLET | Freq: Two times a day (BID) | ORAL | 1 refills | Status: DC

## 2017-07-02 MED ORDER — FOLIC ACID 1 MG PO TABS: 1.0000 mg | ORAL_TABLET | Freq: Every day | ORAL | 11 refills | Status: DC

## 2017-07-02 MED ORDER — ATORVASTATIN CALCIUM 40 MG PO TABS: ORAL_TABLET | ORAL | 1 refills | Status: DC

## 2017-07-02 MED ORDER — HYDROCHLOROTHIAZIDE 25 MG PO TABS: 25.0000 mg | ORAL_TABLET | Freq: Every day | ORAL | 1 refills | Status: DC

## 2017-07-02 MED ORDER — METHOTREXATE 2.5 MG PO TABS: 10.0000 mg | ORAL_TABLET | ORAL | 0 refills | Status: AC

## 2017-07-02 MED FILL — METHOTREXATE 2.5 MG TABLET: 2.5 | 28 days supply | Qty: 16 | Fill #0

## 2017-07-02 NOTE — Patient Instructions (Signed)
It was good to see you today!  For your rheumatoid arthritis,  - increase your methotrexate to 10mg  weekly - keep taking folic acid daily   Please check-out at the front desk before leaving the clinic. Make an appointment in  4 weeks for follow up for methotrexate increase.  We are checking some labs today. If results require attention, either myself or my nurse will get in touch with you. If everything is normal, you will get a letter in the mail or a message in My Chart. Please give a call if you do not hear from [place] after 2 weeks.   Please bring all of your medications with you to each visit.   Sign up for My Chart to have easy access to your labs results, and communication with your primary care physician.  Feel free to call with any questions or concerns at any time, at [phone].   Take care,  Dr. [phone], DO [facility]

## 2017-07-02 NOTE — Progress Notes (Signed)
    Subjective:  Tanya Owens is a 57 y.o. female who presents to the [facility] today for arthritis follow up   HPI:  - Multiple joints painful, no significant relief. Feels like joint pain is getting worse as she now feels it radiating into forearms as opposed to just in hands/wrists - recently saw sports med and started tramadol prn which does work temporarily - taking methotrexate 5mg  q weekly with daily folic acid supplementation - still on prednisone daily, worried about her sugars on this steroid.  - checking sugars at home avg 109-110, highest 126. - tolerating metformin 1000mg  BID well - no falls, no easy bruising, no abd pain, no unsual weight change  ROS: Per HPI  Objective:  Physical Exam: BP 120/70   Pulse 79   Temp 98.5 F (36.9 C) (Oral)   Wt 246 lb (111.6 kg)   SpO2 99%   BMI 36.33 kg/m   Gen: NAD, resting comfortably MSK: multiple joints TTP especially DIPs with mild swelling in both hands. Wearing brace on R leg over knee area Skin: warm, dry Neuro: grossly normal, moves all extremities Psych: Normal affect and thought content   Assessment/Plan:  Cyclic citrullinated peptide (CCP) antibody positive Seronegative arthritis with very positive CCP (>250 on 01/21/17) now compliant on methotrexate 5mg  weekly with daily folic acid supplementation. Unfortunately have not been able to have her establish with rheumatology due to lack of insurance and patient continues to endorse significant joint pain. She does follow with sports medicine who prescribes her tramadol.  - Increase methotrexate to 10mg  weekly, target dose 15mg , max 25mg , will continue to increase in monthly increments if lab work allows - check CBC and CMP today - continue prednisone 5mg  daily, hopeful that once patient at target dose of methotrexate that she will have more significant relief allowing for taper off of prednisone at that time - Follow up in 1 month  Type 2 diabetes mellitus without  complication Fortunately appears to be in good control despite staying on low dose chronic steroids. - continue metformin 1000mg  BID - next a1c due 2/30/19   03/23/17, DO PGY-2, [facility] 07/02/2017 2:08 PM

## 2017-07-03 LAB — CMP14+EGFR
ALBUMIN: 4.6 g/dL (ref 3.5–5.5)
ALK PHOS: 81 IU/L (ref 39–117)
ALT: 20 IU/L (ref 0–32)
AST: 19 IU/L (ref 0–40)
Albumin/Globulin Ratio: 1.4 (ref 1.2–2.2)
BILIRUBIN TOTAL: 0.3 mg/dL (ref 0.0–1.2)
BUN / CREAT RATIO: 14 (ref 9–23)
BUN: 9 mg/dL (ref 6–24)
CO2: 22 mmol/L (ref 20–29)
CREATININE: 0.66 mg/dL (ref 0.57–1.00)
Calcium: 10.2 mg/dL (ref 8.7–10.2)
Chloride: 97 mmol/L (ref 96–106)
GFR calc Af Amer: 113 mL/min/{1.73_m2} (ref >59)
GFR calc non Af Amer: 98 mL/min/{1.73_m2} (ref >59)
GLUCOSE: 159 mg/dL — AB (ref 65–99)
Globulin, Total: 3.4 g/dL (ref 1.5–4.5)
Potassium: 3.9 mmol/L (ref 3.5–5.2)
Sodium: 139 mmol/L (ref 134–144)
Total Protein: 8 g/dL (ref 6.0–8.5)

## 2017-07-03 LAB — CBC
HEMOGLOBIN: 12.3 g/dL (ref 11.1–15.9)
Hematocrit: 36.9 % (ref 34.0–46.6)
MCH: 25.9 pg — AB (ref 26.6–33.0)
MCHC: 33.3 g/dL (ref 31.5–35.7)
MCV: 78 fL — ABNORMAL LOW (ref 79–97)
PLATELETS: 296 10*3/uL (ref 150–379)
RBC: 4.75 x10E6/uL (ref 3.77–5.28)
RDW: 16.4 % — AB (ref 12.3–15.4)
WBC: 7.5 10*3/uL (ref 3.4–10.8)

## 2017-07-03 LAB — LDL CHOLESTEROL, DIRECT: LDL Direct: 129 mg/dL — ABNORMAL HIGH (ref 0–99)

## 2017-07-03 NOTE — Assessment & Plan Note (Signed)
Seronegative arthritis with very positive CCP (>250 on 01/21/17) now compliant on methotrexate 5mg  weekly with daily folic acid supplementation. Unfortunately have not been able to have her establish with rheumatology due to lack of insurance and patient continues to endorse significant joint pain. She does follow with sports medicine who prescribes her tramadol.  - Increase methotrexate to 10mg  weekly, target dose 15mg , max 25mg , will continue to increase in monthly increments if lab work allows - check CBC and CMP today - continue prednisone 5mg  daily, hopeful that once patient at target dose of methotrexate that she will have more significant relief allowing for taper off of prednisone at that time - Follow up in 1 month

## 2017-07-03 NOTE — Assessment & Plan Note (Signed)
Fortunately appears to be in good control despite staying on low dose chronic steroids. - continue metformin 1000mg  BID - next a1c due 2/30/19

## 2017-07-30 ENCOUNTER — Encounter: Payer: Self-pay | Admitting: Family Medicine

## 2017-07-30 ENCOUNTER — Ambulatory Visit: Payer: Medicaid Other | Admitting: Family Medicine

## 2017-07-30 VITALS — BP 150/90 | Ht 69.0 in | Wt 250.0 lb

## 2017-07-30 DIAGNOSIS — M2550 Pain in unspecified joint: Secondary | ICD-10-CM | POA: Diagnosis not present

## 2017-07-30 DIAGNOSIS — R7989 Other specified abnormal findings of blood chemistry: Secondary | ICD-10-CM

## 2017-07-30 DIAGNOSIS — M0609 Rheumatoid arthritis without rheumatoid factor, multiple sites: Secondary | ICD-10-CM

## 2017-07-30 DIAGNOSIS — R768 Other specified abnormal immunological findings in serum: Secondary | ICD-10-CM

## 2017-07-30 DIAGNOSIS — M1389 Other specified arthritis, multiple sites: Secondary | ICD-10-CM | POA: Diagnosis present

## 2017-07-30 DIAGNOSIS — M25561 Pain in right knee: Secondary | ICD-10-CM

## 2017-07-30 DIAGNOSIS — M25511 Pain in right shoulder: Secondary | ICD-10-CM

## 2017-07-30 MED ORDER — METHYLPREDNISOLONE ACETATE 40 MG/ML IJ SUSP
40.0000 mg | Freq: Once | INTRAMUSCULAR | Status: AC
  Administered 2017-07-30: 40 mg via INTRA_ARTICULAR

## 2017-07-30 MED ORDER — TRAMADOL HCL 50 MG PO TABS: ORAL_TABLET | ORAL | 3 refills | Status: DC

## 2017-07-30 NOTE — Patient Instructions (Signed)
[facility] [address] [place] [address]  [phone] is the phone number [phone] is the fax number  Isabelle Course, [name] of Rheumatology Tuesday, April 9th at 3:40pm

## 2017-07-30 NOTE — Assessment & Plan Note (Signed)
She has finally gotten some insurance coverage so we will see if we can get her set up with rheumatology.  She has had some improvement in multiple arthropathies with increase of methotrexate from 5-10 mg.  She continues on 5 mg a day of prednisone.  She became confused on her tramadol dosing so we straightened that out today.  She is having some improvement from that.  We have arranged for her to be followed up at rheumatology.

## 2017-07-30 NOTE — Progress Notes (Signed)
    CHIEF COMPLAINT / HPI: Right shoulder and right knee pain in setting of seronegative arthropathy She finally got Medicaid so is potentially able to be seen by rheumatology.  In the meantime she is having very slight improvement in her multiple arthralgias except for her right knee and her right shoulder which do not seem improved.  Her PCP increased her methotrexate to 10 mg daily.  She continues on 5 mg daily of prednisone.  On her own she increased her tramadol taking 3 at a time rather than 1-3 times a day.  That seemed to work for her but she ran out of course  REVIEW OF SYSTEMS: No unusual weight change, no fever.  No GI upset.  No blood in stool.  PERTINENT  PMH / PSH: I have reviewed the patient's medications, allergies, past medical and surgical history, smoking status and updated in the EMR as appropriate.   OBJECTIVE: GENERAL: Well-developed white female no acute distress shoulder: Right.  Limited range of motion in abduction above 100 degrees secondary to pain but passive range of motion is full.  Rotator cuff strength is intact in all planes.  The acromioclavicular joint is mildly tender to palpation.  There is no deformity. KNEE: Right.  Full range of motion flexion extension.  Ligamentously intact to varus and valgus stress.  Normal Lockman.  Trace effusion.  Calf is soft.  Popliteal space is benign. vascular: Radial pulses 2+ bilateral symmetrical.  Posterior tibial and dorsalis pedis pulses 2+ bilateral symmetrical Skin: Area around the right shoulder and the right knee is without any sign of lesion, no unusual erythema or warmth.  PROCEDURE: INJECTION: Patient was given informed consent, signed copy in the chart. Appropriate time out was taken. Area prepped and draped in usual sterile fashion. Ethyl chloride was  used for local anesthesia. A 21 gauge 1 1/2 inch needle was used.. 1 cc of methylprednisolone 40 mg/ml plus 4 cc of 1% lidocaine without epinephrine was injected  into the right subacromial bursa of the shoulder joint using a(n) posterior approach.   The patient tolerated the procedure well. There were no complications. Post procedure instructions were given. PROCEDURE: INJECTION: Patient was given informed consent, signed copy in the chart. Appropriate time out was taken. Area prepped and draped in usual sterile fashion. Ethyl chloride was  used for local anesthesia. A 21 gauge 1 1/2 inch needle was used.. 1 cc of methylprednisolone 40 mg/ml plus 4 cc of 1% lidocaine without epinephrine was injected into the right knee joint using a(n) anterior medial approach.   The patient tolerated the procedure well. There were no complications. Post procedure instructions were given.   ASSESSMENT / PLAN: Please see problem oriented charting for details

## 2017-08-13 ENCOUNTER — Other Ambulatory Visit: Payer: Self-pay

## 2017-08-13 ENCOUNTER — Other Ambulatory Visit: Payer: Self-pay | Admitting: *Deleted

## 2017-08-13 DIAGNOSIS — M06042 Rheumatoid arthritis without rheumatoid factor, left hand: Secondary | ICD-10-CM

## 2017-08-13 DIAGNOSIS — Z79899 Other long term (current) drug therapy: Secondary | ICD-10-CM

## 2017-08-13 DIAGNOSIS — M129 Arthropathy, unspecified: Secondary | ICD-10-CM

## 2017-08-13 DIAGNOSIS — Z79631 Long term (current) use of antimetabolite agent: Secondary | ICD-10-CM

## 2017-08-13 DIAGNOSIS — I10 Essential (primary) hypertension: Secondary | ICD-10-CM

## 2017-08-13 DIAGNOSIS — M2550 Pain in unspecified joint: Secondary | ICD-10-CM

## 2017-08-13 DIAGNOSIS — M06041 Rheumatoid arthritis without rheumatoid factor, right hand: Secondary | ICD-10-CM

## 2017-08-13 DIAGNOSIS — E119 Type 2 diabetes mellitus without complications: Secondary | ICD-10-CM

## 2017-08-13 MED ORDER — MELOXICAM 15 MG PO TABS: 15.0000 mg | ORAL_TABLET | Freq: Every day | ORAL | 1 refills | Status: DC

## 2017-08-13 MED ORDER — AMLODIPINE BESYLATE 10 MG PO TABS: 10.0000 mg | ORAL_TABLET | Freq: Every day | ORAL | 1 refills | Status: DC

## 2017-08-13 MED ORDER — GABAPENTIN 300 MG PO CAPS: 300.0000 mg | ORAL_CAPSULE | Freq: Every day | ORAL | 0 refills | Status: DC

## 2017-08-13 MED ORDER — PREDNISONE 5 MG PO TABS: 5.0000 mg | ORAL_TABLET | Freq: Every day | ORAL | 0 refills | Status: DC

## 2017-08-13 MED ORDER — HYDROCHLOROTHIAZIDE 25 MG PO TABS: 25.0000 mg | ORAL_TABLET | Freq: Every day | ORAL | 1 refills | Status: DC

## 2017-08-13 MED ORDER — TRAMADOL HCL 50 MG PO TABS: ORAL_TABLET | ORAL | 3 refills | Status: DC

## 2017-08-13 MED ORDER — IBUPROFEN 800 MG PO TABS: 800.0000 mg | ORAL_TABLET | Freq: Four times a day (QID) | ORAL | 0 refills | Status: DC | PRN

## 2017-08-13 MED ORDER — METFORMIN HCL 1000 MG PO TABS: 1000.0000 mg | ORAL_TABLET | Freq: Two times a day (BID) | ORAL | 1 refills | Status: DC

## 2017-08-13 MED ORDER — FOLIC ACID 1 MG PO TABS: 1.0000 mg | ORAL_TABLET | Freq: Every day | ORAL | 11 refills | Status: DC

## 2017-08-13 MED ORDER — LISINOPRIL 20 MG PO TABS: 20.0000 mg | ORAL_TABLET | Freq: Every day | ORAL | 1 refills | Status: DC

## 2017-08-13 MED ORDER — ATORVASTATIN CALCIUM 40 MG PO TABS: ORAL_TABLET | ORAL | 1 refills | Status: DC

## 2017-08-13 MED FILL — traMADol HCL 50 MG TABS: 50 | 30 days supply | Qty: 180 | Fill #0

## 2017-08-13 NOTE — Telephone Encounter (Signed)
Patient left message on nurse line that she now has Medicaid and needs all prescriptions sent to [facility] on [address]. Ples Specter, RN [facility] RN)

## 2017-08-16 ENCOUNTER — Telehealth: Payer: Self-pay

## 2017-08-16 ENCOUNTER — Ambulatory Visit (INDEPENDENT_AMBULATORY_CARE_PROVIDER_SITE_OTHER): Payer: Self-pay | Admitting: Family Medicine

## 2017-08-16 ENCOUNTER — Other Ambulatory Visit: Payer: Self-pay

## 2017-08-16 ENCOUNTER — Encounter: Payer: Self-pay | Admitting: Family Medicine

## 2017-08-16 VITALS — BP 116/76 | HR 75 | Temp 98.3°F | Wt 243.0 lb

## 2017-08-16 DIAGNOSIS — E119 Type 2 diabetes mellitus without complications: Secondary | ICD-10-CM

## 2017-08-16 DIAGNOSIS — M0609 Rheumatoid arthritis without rheumatoid factor, multiple sites: Secondary | ICD-10-CM

## 2017-08-16 DIAGNOSIS — M1389 Other specified arthritis, multiple sites: Secondary | ICD-10-CM

## 2017-08-16 LAB — POCT GLYCOSYLATED HEMOGLOBIN (HGB A1C): HEMOGLOBIN A1C: 6.7

## 2017-08-16 MED ORDER — METHOTREXATE SODIUM 5 MG PO TABS: 15.0000 mg | ORAL_TABLET | ORAL | 0 refills | Status: DC

## 2017-08-16 NOTE — Progress Notes (Signed)
    Subjective:  Tanya Owens is a 57 y.o. female who presents to the [facility] today for arthritis follow-up.  HPI:  States that since last visit for the first time she has several days without pain.  She feels like she is starting to get some relief.  She states that approximately 4 days out of a week she will be either pain-free or have minimal pain.  She is however having joint pain today. She has an appointment to see a rheumatologist on April 9 she biqw has insurance coverage. She has been taking methotrexate 10 mg weekly with daily folic acid supplementation, she voices good compliance.  Tolerating well Still on prednisone daily No falls.  No unusual weight change.  No fever.  ROS: Per HPI  Objective:  Physical Exam: BP 116/76   Pulse 75   Temp 98.3 F (36.8 C) (Oral)   Wt 243 lb (110.2 kg)   SpO2 98%   BMI 35.88 kg/m   Gen: NAD, resting comfortably CV: RRR with no murmurs appreciated Pulm: NWOB, CTAB with no crackles, wheezes, or rhonchi GI: Normal bowel sounds present. Soft, Nontender, Nondistended. MSK: no edema, cyanosis, or clubbing noted.  No appreciable visible swelling in bilateral hands.  Skin: warm, dry Neuro: grossly normal, moves all extremities Psych: Normal affect and thought content  Assessment/Plan:  Seronegative arthropathy of multiple sites Patient is starting to have some partial relief of her symptoms.  She is due to establish with rheumatology on April 9.  Increase methotrexate from 10 mg weekly to 15 mg weekly today.  Check CBC and CMP today. Reinforced daily folic acid supplementation.  Discussed that hopeful to start tapering off prednisone either at her rheumatology appointment or at next visit with me in 1 month.   Leland Her, DO PGY-2, [facility] 08/16/2017 4:04 PM

## 2017-08-16 NOTE — Telephone Encounter (Signed)
Received call from [facility] pharmacy. Methotrexate 5 mg only available in name brand and cost hundreds of dollars.  Patient has no insurance and pays cash. Would like to substitute 2.5 mg. The cost to make the dose requested would be $33.15.  Please call pharmacy to give approval if indicated.   Call back is [phone].  Ples Specter, RN [facility] RN)

## 2017-08-16 NOTE — Patient Instructions (Signed)
Increase to 3 tablets of methotrexate weekly.  See me in 1 month to see how the transition went. If your new arthritis doctor has not tapered you off prednisone and your pain continues to improve, I hope to do it at our next appointment.  We are checking some labs today. If results require attention, either myself or my nurse will get in touch with you. If everything is normal, you will get a letter in the mail or a message in My Chart. Please give [place] a call if you do not hear from [place] after 2 weeks.   Feel free to call with any questions or concerns at any time, at [phone].   Take care,  Dr. Leland Her, DO [facility]

## 2017-08-17 ENCOUNTER — Encounter: Payer: Self-pay | Admitting: Family Medicine

## 2017-08-17 LAB — CMP14+EGFR
A/G RATIO: 1.5 (ref 1.2–2.2)
ALBUMIN: 4.4 g/dL (ref 3.5–5.5)
ALK PHOS: 84 IU/L (ref 39–117)
ALT: 16 IU/L (ref 0–32)
AST: 18 IU/L (ref 0–40)
BILIRUBIN TOTAL: 0.3 mg/dL (ref 0.0–1.2)
BUN / CREAT RATIO: 17 (ref 9–23)
BUN: 12 mg/dL (ref 6–24)
CHLORIDE: 99 mmol/L (ref 96–106)
CO2: 21 mmol/L (ref 20–29)
Calcium: 9.4 mg/dL (ref 8.7–10.2)
Creatinine, Ser: 0.7 mg/dL (ref 0.57–1.00)
GFR calc non Af Amer: 96 mL/min/{1.73_m2} (ref >59)
GFR, EST AFRICAN AMERICAN: 110 mL/min/{1.73_m2} (ref >59)
Globulin, Total: 3 g/dL (ref 1.5–4.5)
Glucose: 109 mg/dL — ABNORMAL HIGH (ref 65–99)
POTASSIUM: 3.5 mmol/L (ref 3.5–5.2)
Sodium: 138 mmol/L (ref 134–144)
TOTAL PROTEIN: 7.4 g/dL (ref 6.0–8.5)

## 2017-08-17 LAB — CBC
Hematocrit: 36.5 % (ref 34.0–46.6)
Hemoglobin: 12.5 g/dL (ref 11.1–15.9)
MCH: 26.3 pg — ABNORMAL LOW (ref 26.6–33.0)
MCHC: 34.2 g/dL (ref 31.5–35.7)
MCV: 77 fL — AB (ref 79–97)
PLATELETS: 248 10*3/uL (ref 150–379)
RBC: 4.76 x10E6/uL (ref 3.77–5.28)
RDW: 16.8 % — ABNORMAL HIGH (ref 12.3–15.4)
WBC: 8.1 10*3/uL (ref 3.4–10.8)

## 2017-08-17 NOTE — Telephone Encounter (Signed)
Spoke with pharmacy, medication changed.

## 2017-08-17 NOTE — Assessment & Plan Note (Addendum)
Patient is starting to have some partial relief of her symptoms.  She is due to establish with rheumatology on April 9.  Increase methotrexate from 10 mg weekly to 15 mg weekly today.  Check CBC and CMP today. Reinforced daily folic acid supplementation.  Discussed that hopeful to start tapering off prednisone either at her rheumatology appointment or at next visit with me in 1 month.

## 2017-09-22 ENCOUNTER — Other Ambulatory Visit: Payer: Self-pay

## 2017-09-22 DIAGNOSIS — Z79899 Other long term (current) drug therapy: Secondary | ICD-10-CM

## 2017-09-22 DIAGNOSIS — I10 Essential (primary) hypertension: Secondary | ICD-10-CM

## 2017-09-22 DIAGNOSIS — M2550 Pain in unspecified joint: Secondary | ICD-10-CM

## 2017-09-22 DIAGNOSIS — M1389 Other specified arthritis, multiple sites: Secondary | ICD-10-CM

## 2017-09-22 DIAGNOSIS — Z79631 Long term (current) use of antimetabolite agent: Secondary | ICD-10-CM

## 2017-09-22 DIAGNOSIS — E119 Type 2 diabetes mellitus without complications: Secondary | ICD-10-CM

## 2017-09-22 DIAGNOSIS — M0609 Rheumatoid arthritis without rheumatoid factor, multiple sites: Secondary | ICD-10-CM

## 2017-09-22 MED ORDER — LISINOPRIL 20 MG PO TABS: 20.0000 mg | ORAL_TABLET | Freq: Every day | ORAL | 1 refills | Status: DC

## 2017-09-22 MED ORDER — HYDROCHLOROTHIAZIDE 25 MG PO TABS: 25.0000 mg | ORAL_TABLET | Freq: Every day | ORAL | 1 refills | Status: DC

## 2017-09-22 MED ORDER — IBUPROFEN 800 MG PO TABS: 800.0000 mg | ORAL_TABLET | Freq: Four times a day (QID) | ORAL | 0 refills | Status: DC | PRN

## 2017-09-22 MED ORDER — FOLIC ACID 1 MG PO TABS: 1.0000 mg | ORAL_TABLET | Freq: Every day | ORAL | 11 refills | Status: DC

## 2017-09-22 MED ORDER — AMLODIPINE BESYLATE 10 MG PO TABS: 10.0000 mg | ORAL_TABLET | Freq: Every day | ORAL | 1 refills | Status: DC

## 2017-09-22 MED ORDER — METFORMIN HCL 1000 MG PO TABS: 1000.0000 mg | ORAL_TABLET | Freq: Two times a day (BID) | ORAL | 1 refills | Status: DC

## 2017-09-22 MED ORDER — GABAPENTIN 300 MG PO CAPS: 300.0000 mg | ORAL_CAPSULE | Freq: Every day | ORAL | 0 refills | Status: DC

## 2017-09-22 MED ORDER — ATORVASTATIN CALCIUM 40 MG PO TABS
ORAL_TABLET | ORAL | 1 refills | Status: DC
Start: 2017-09-22 — End: 2018-04-20

## 2017-09-22 NOTE — Telephone Encounter (Signed)
Please re-send attached prescriptions to [facility] on [address]  Ples Specter, RN [facility] RN)

## 2017-09-22 NOTE — Telephone Encounter (Signed)
Spoke with patient on the phone. She actually needed just her diabetes and HTN medications sent in as well as ibuprofen and gabapentin. She said methotrexate was recently refilled by new rheumatologist. Agreed for next appointment to be in August for diabetes follow up.

## 2017-10-22 ENCOUNTER — Encounter: Payer: Self-pay | Admitting: Family Medicine

## 2017-10-22 ENCOUNTER — Ambulatory Visit (INDEPENDENT_AMBULATORY_CARE_PROVIDER_SITE_OTHER): Payer: Medicaid Other | Admitting: Family Medicine

## 2017-10-22 DIAGNOSIS — M2550 Pain in unspecified joint: Secondary | ICD-10-CM

## 2017-10-22 DIAGNOSIS — M0609 Rheumatoid arthritis without rheumatoid factor, multiple sites: Secondary | ICD-10-CM

## 2017-10-22 DIAGNOSIS — M1389 Other specified arthritis, multiple sites: Secondary | ICD-10-CM | POA: Diagnosis not present

## 2017-10-22 MED ORDER — GABAPENTIN 300 MG PO CAPS: ORAL_CAPSULE | ORAL | 1 refills | Status: DC

## 2017-10-22 NOTE — Patient Instructions (Signed)
[facility] - [facility]  [address]  [address]  [place], [place] [address]  [phone]

## 2017-10-25 ENCOUNTER — Encounter: Payer: Self-pay | Admitting: Family Medicine

## 2017-10-25 ENCOUNTER — Other Ambulatory Visit: Payer: Self-pay | Admitting: Family Medicine

## 2017-10-25 DIAGNOSIS — M1389 Other specified arthritis, multiple sites: Secondary | ICD-10-CM

## 2017-10-25 DIAGNOSIS — M0609 Rheumatoid arthritis without rheumatoid factor, multiple sites: Secondary | ICD-10-CM

## 2017-10-25 NOTE — Assessment & Plan Note (Signed)
I will continue tramadol.  I spoke with her PCP, Dr. Marquis Lunch.  We agreed to send her to pain clinic for evaluation.  I will set that up.

## 2017-10-25 NOTE — Progress Notes (Signed)
    CHIEF COMPLAINT / HPI: Follow-up multiple joint arthralgias.  She does not think the tramadol is keeping her pain in check.  It is helping, but he still has chronic constant pain of at least 5 out of 10 of multiple joints. She was seen for an initial visit by the rheumatologist.  They are having trouble getting her DMARD education for her secondary to cost but she thinks they are still working on that.  They did increase her methotrexate a little bit and she continues to take her folic acid. REVIEW OF SYSTEMS: No unusual weight change, no fever.  No abdominal pain.  No chest pain.  PERTINENT  PMH / PSH: I have reviewed the patient's medications, allergies, past medical and surgical history, smoking status and updated in the EMR as appropriate.   OBJECTIVE: GENERAL: Well-developed female no acute distress CV: Regular rate and rhythm RESPIRATORY: No unusual work of breathing.  Respiratory rate is normal. JOINTS: Multiple areas of arthralgia in the shoulders hands wrists hips and knees however none are deformed, none are warm. SKIN: There is no skin rash noted over hands wrists elbows knees or ankles. vascular: Cells pedis and radial pulses are 2+ bilateral symmetrical PSYCHIATRIC: Alert and oriented x4.  Affect is interactive.  Speech is normal in fluency and content.  Judgment is normal.  ASSESSMENT / PLAN: Please see problem oriented charting for details

## 2017-11-05 ENCOUNTER — Telehealth: Payer: Self-pay | Admitting: Family Medicine

## 2017-11-05 NOTE — Telephone Encounter (Signed)
-----   Message from [facility] sent at 10/27/2017  2:07 PM EDT ----- Regarding: FW: questions Contact: [phone] Hey Dr. Jennette Kettle. I called Tanya Owens and she's still taking the tramadol and gabapentin with no relief in pain. She wants to know what else she can do while she waits to get into the pain clinic. Thanks!  ----- Message ----- From: [name] Sent: 10/27/2017  10:34 AM To: Rutha Bouchard Subject: questions                                      Pt has questions regarding her pain. Can she take gabapentin during the day for pain. She is taking 3 tablets at bedtime and its not helping pain.

## 2017-11-05 NOTE — Telephone Encounter (Signed)
Megan She can increase her gabapentin to 1 in AM, 1 at lunch and 3 at bedtime. Tell her to let me know when she needs a new RX as she will run out with the increase THANKS! Denny Levy

## 2017-11-16 ENCOUNTER — Encounter: Payer: Self-pay | Admitting: Physical Medicine & Rehabilitation

## 2017-11-17 ENCOUNTER — Other Ambulatory Visit: Payer: Self-pay | Admitting: Family Medicine

## 2017-11-17 DIAGNOSIS — M2550 Pain in unspecified joint: Secondary | ICD-10-CM

## 2017-11-17 DIAGNOSIS — M129 Arthropathy, unspecified: Secondary | ICD-10-CM

## 2017-11-17 DIAGNOSIS — Z79631 Long term (current) use of antimetabolite agent: Secondary | ICD-10-CM

## 2017-11-17 DIAGNOSIS — M06042 Rheumatoid arthritis without rheumatoid factor, left hand: Secondary | ICD-10-CM

## 2017-11-17 DIAGNOSIS — Z79899 Other long term (current) drug therapy: Secondary | ICD-10-CM

## 2017-11-17 DIAGNOSIS — M1389 Other specified arthritis, multiple sites: Secondary | ICD-10-CM

## 2017-11-17 DIAGNOSIS — I10 Essential (primary) hypertension: Secondary | ICD-10-CM

## 2017-11-17 DIAGNOSIS — M06041 Rheumatoid arthritis without rheumatoid factor, right hand: Secondary | ICD-10-CM

## 2017-11-17 DIAGNOSIS — M0609 Rheumatoid arthritis without rheumatoid factor, multiple sites: Secondary | ICD-10-CM

## 2017-11-17 NOTE — Telephone Encounter (Signed)
Pt would like a refill on all her medications except for Metformin. Please advise

## 2017-11-17 NOTE — Telephone Encounter (Signed)
Called pt to get a detailed list to send to MD to approval. Pt was driving and will call me back when she gets home.

## 2017-11-19 MED ORDER — LISINOPRIL 20 MG PO TABS: 20.0000 mg | ORAL_TABLET | Freq: Every day | ORAL | 1 refills | Status: DC

## 2017-11-19 MED ORDER — FOLIC ACID 1 MG PO TABS: 1.0000 mg | ORAL_TABLET | Freq: Every day | ORAL | 11 refills | Status: DC

## 2017-11-19 MED ORDER — IBUPROFEN 800 MG PO TABS: 800.0000 mg | ORAL_TABLET | Freq: Three times a day (TID) | ORAL | 0 refills | Status: DC | PRN

## 2017-11-19 MED ORDER — METHOTREXATE SODIUM 5 MG PO TABS: 15.0000 mg | ORAL_TABLET | ORAL | 0 refills | Status: DC

## 2017-11-19 MED ORDER — AMLODIPINE BESYLATE 10 MG PO TABS: 10.0000 mg | ORAL_TABLET | Freq: Every day | ORAL | 1 refills | Status: DC

## 2017-11-19 MED ORDER — PREDNISONE 5 MG PO TABS: 5.0000 mg | ORAL_TABLET | Freq: Every day | ORAL | 0 refills | Status: DC

## 2017-11-19 MED ORDER — HYDROCHLOROTHIAZIDE 25 MG PO TABS: 25.0000 mg | ORAL_TABLET | Freq: Every day | ORAL | 1 refills | Status: DC

## 2017-11-19 NOTE — Telephone Encounter (Signed)
Talked with patient regarding her medication refills as per last rheumatology office note patient is supposed to be on Enbrel.  Patient says that she has not heard back regarding the prior Auth.  Gave the patient contact information for rheumatology office and advised that she call them as patient states that she does not go back to see her rheumatologist for another 3 months.  Refilled all medications.

## 2017-11-26 ENCOUNTER — Encounter: Payer: Medicaid Other | Attending: Physical Medicine & Rehabilitation

## 2017-11-26 ENCOUNTER — Encounter: Payer: Self-pay | Admitting: Physical Medicine & Rehabilitation

## 2017-11-26 ENCOUNTER — Ambulatory Visit: Payer: Medicaid Other | Admitting: Physical Medicine & Rehabilitation

## 2017-11-26 VITALS — BP 130/84 | HR 76 | Ht 69.0 in | Wt 247.8 lb

## 2017-11-26 DIAGNOSIS — I10 Essential (primary) hypertension: Secondary | ICD-10-CM | POA: Diagnosis not present

## 2017-11-26 DIAGNOSIS — G4730 Sleep apnea, unspecified: Secondary | ICD-10-CM | POA: Insufficient documentation

## 2017-11-26 DIAGNOSIS — M79641 Pain in right hand: Secondary | ICD-10-CM | POA: Diagnosis not present

## 2017-11-26 DIAGNOSIS — Z5181 Encounter for therapeutic drug level monitoring: Secondary | ICD-10-CM | POA: Diagnosis not present

## 2017-11-26 DIAGNOSIS — E7800 Pure hypercholesterolemia, unspecified: Secondary | ICD-10-CM | POA: Diagnosis not present

## 2017-11-26 DIAGNOSIS — M79642 Pain in left hand: Secondary | ICD-10-CM | POA: Diagnosis not present

## 2017-11-26 DIAGNOSIS — M1389 Other specified arthritis, multiple sites: Secondary | ICD-10-CM | POA: Diagnosis not present

## 2017-11-26 DIAGNOSIS — M25571 Pain in right ankle and joints of right foot: Secondary | ICD-10-CM | POA: Insufficient documentation

## 2017-11-26 DIAGNOSIS — M25572 Pain in left ankle and joints of left foot: Secondary | ICD-10-CM | POA: Diagnosis not present

## 2017-11-26 DIAGNOSIS — Z79891 Long term (current) use of opiate analgesic: Secondary | ICD-10-CM | POA: Diagnosis not present

## 2017-11-26 DIAGNOSIS — M0609 Rheumatoid arthritis without rheumatoid factor, multiple sites: Secondary | ICD-10-CM

## 2017-11-26 DIAGNOSIS — Z87891 Personal history of nicotine dependence: Secondary | ICD-10-CM | POA: Diagnosis not present

## 2017-11-26 DIAGNOSIS — M25562 Pain in left knee: Secondary | ICD-10-CM | POA: Diagnosis not present

## 2017-11-26 DIAGNOSIS — M25561 Pain in right knee: Secondary | ICD-10-CM | POA: Insufficient documentation

## 2017-11-26 DIAGNOSIS — E119 Type 2 diabetes mellitus without complications: Secondary | ICD-10-CM | POA: Insufficient documentation

## 2017-11-26 DIAGNOSIS — M19011 Primary osteoarthritis, right shoulder: Secondary | ICD-10-CM | POA: Diagnosis not present

## 2017-11-26 MED ORDER — DICLOFENAC SODIUM 1 % TD GEL: 2.0000 g | Freq: Four times a day (QID) | TRANSDERMAL | 1 refills | Status: DC

## 2017-11-26 NOTE — Patient Instructions (Signed)
Referral to therapy PT for legs, OT for hands and arms  Make appt with the Rheumatologist

## 2017-11-26 NOTE — Progress Notes (Addendum)
Subjective:    Patient ID: Tanya Owens, female    DOB: 09/17/1960, 57 y.o.   MRN: [id number]  HPI 57 year old female with 6 to 7-year history of diabetes as well as hypertension with a greater than 2-year history of migratory joint pains that was referred by sports medicine for physical medicine and rehabilitation evaluation.  The patient is independent with all self-care and mobility.  She does not require an assistive device for ambulation.  Her primary complaints include right greater than left hand pain mainly in the knuckles of the index and middle finger.  She has some burning pain in both wrists but not in her fingers.  She has inability to raise her right shoulder beyond a few degrees and her left shoulder only has partial range of motion.  In addition she complains of bilateral knee pain and bilateral ankle pain more so than foot pain.  She has been treated by her primary care physician for polyarthralgia initially with ibuprofen.  The patient was referred from primary care to sports medicine.  Underwent right shoulder injection as well as knee injection with corticosteroid.  She indicated a short-term relief with these injections.  The patient had x-rays of both ankles and feet showing some mild midfoot arthritis on the left and mild degenerative changes right ankle.  Has had shoulder x-rays showing acromioclavicular joint OA as well as calcific tendinitis of the right shoulder.  Blood work normal CRP minimally increased sed rate of 31, rheumatoid factor negative ANA negative.  Referral to rheumatology.  Trialed on methotrexate as well as low-dose prednisone without much relief.  Analgesic changed to tramadol initially on 1 tablet 3 times a day patient increased it own and took 3 at a time.  Subsequently changed to 2 tablets 3 times per day.  Trialed on etanercept injections weekly for 4 weeks but then the patient discontinued this because she did not feel it was beneficial.  She has not  scheduled a follow-up appointment with rheumatology and we discussed the importance of this.  The patient has not had any physical therapy or occupational therapy thus far. Opioid risk tool score is 0 Reviewed PMP aware website no red flags.  Not on benzos Pain Inventory Average Pain 10 Pain Right Now 10 My pain is aching  In the last 24 hours, has pain interfered with the following? General activity 0 Relation with others 0 Enjoyment of life 0 What TIME of day is your pain at its worst? nA Sleep (in general) NA  Pain is worse with: walking, sitting and standing Pain improves with: rest and medication Relief from Meds: 0  Mobility ability to climb steps?  no do you drive?  no  Function disabled: date disabled . I need assistance with the following:  feeding, dressing, bathing, meal prep and shopping  Neuro/Psych weakness trouble walking confusion loss of taste or smell  Prior Studies Any changes since last visit?  no  Physicians involved in your care Any changes since last visit?  no   Family History  Problem Relation Age of Onset  . Heart attack Mother   . Diabetes Mother   . Hypercholesterolemia Mother   . Hypertension Mother   . Diabetes Sister   . Hypertension Brother    Social History   Socioeconomic History  . Marital status: Single    Spouse name: Not on file  . Number of children: Not on file  . Years of education: Not on file  . Highest education  level: Not on file  Occupational History  . Not on file  Social Needs  . Financial resource strain: Not on file  . Food insecurity:    Worry: Not on file    Inability: Not on file  . Transportation needs:    Medical: Not on file    Non-medical: Not on file  Tobacco Use  . Smoking status: Former Smoker    Last attempt to quit: 12/27/2012    Years since quitting: 4.9  . Smokeless tobacco: Never Used  Substance and Sexual Activity  . Alcohol use: No  . Drug use: No  . Sexual activity: Yes     Birth control/protection: Post-menopausal  Lifestyle  . Physical activity:    Days per week: Not on file    Minutes per session: Not on file  . Stress: Not on file  Relationships  . Social connections:    Talks on phone: Not on file    Gets together: Not on file    Attends religious service: Not on file    Active member of club or organization: Not on file    Attends meetings of clubs or organizations: Not on file    Relationship status: Not on file  Other Topics Concern  . Not on file  Social History Narrative  . Not on file   Past Surgical History:  Procedure Laterality Date  . CARDIAC CATHETERIZATION  10/18/2007   Normal coronaries, systemic hypertension   Past Medical History:  Diagnosis Date  . Arthritis 2000  . Diabetes mellitus without complication (HCC) 2005  . Hypercholesteremia 2005  . Hypertension 2005  . Sleep apnea 2010  . Sleep disorder    BP 130/84   Pulse 76   Ht 5\' 9"  (1.753 m)   Wt 247 lb 12.8 oz (112.4 kg)   SpO2 98%   BMI 36.59 kg/m   Opioid Risk Score:   Fall Risk Score:  `1  Depression screen PHQ 2/9  Depression screen [facility] 2/9 08/16/2017 07/02/2017 06/02/2017 04/16/2017 02/01/2017 01/04/2017 12/11/2016  Decreased Interest 0 0 0 0 0 0 0  Down, Depressed, Hopeless 0 0 0 0 0 0 0  PHQ - 2 Score 0 0 0 0 0 0 0     Review of Systems  Constitutional: Positive for diaphoresis and unexpected weight change.  HENT: Negative.   Eyes: Negative.   Respiratory: Positive for shortness of breath.   Cardiovascular: Negative.   Gastrointestinal: Positive for constipation.  Endocrine: Negative.   Genitourinary: Negative.   Musculoskeletal: Positive for arthralgias, gait problem and myalgias.  Skin: Negative.   Allergic/Immunologic: Negative.   Neurological: Positive for numbness.  Hematological: Negative.   Psychiatric/Behavioral: Positive for confusion.  All other systems reviewed and are negative.      Objective:   Physical Exam  Constitutional: She  is oriented to person, place, and time. She appears well-developed and well-nourished. No distress.  HENT:  Head: Normocephalic and atraumatic.  Eyes: Pupils are equal, round, and reactive to light. EOM are normal.  Neck: Normal range of motion.  Cardiovascular: Normal rate, regular rhythm and normal heart sounds.  No murmur heard. Pulmonary/Chest: Effort normal and breath sounds normal. No stridor. No respiratory distress. She has no wheezes.  Abdominal: Soft. Bowel sounds are normal. She exhibits no distension. There is no tenderness.  Musculoskeletal:  Patient has tenderness and mild swelling but no erythema in MCPs 1 and 2 of both hands.  Right hand more tender and swollen than left.  There  is also mild tenderness in the right PIPs digits 1 through 5 but not in the DIPs. Left upper extremity no evidence of swelling in PIP or DIP minimal tenderness in the digit 2 through 5 PIPs on the left side. No evidence of wrist effusion.  No evidence of nodules over the elbows. Elbow wrist range of motion is normal.  Hand range of motion reduced secondary to pain and swelling No pain with hip or knee range of motion no evidence of crepitus or effusion in both knees.  No evidence of ankle effusion.  No toe deformities.  Neurological: She is alert and oriented to person, place, and time. She displays no atrophy and no tremor. No sensory deficit. She exhibits normal muscle tone. Gait abnormal.  Reflex Scores:      Tricep reflexes are 1+ on the right side and 1+ on the left side.      Bicep reflexes are 1+ on the right side and 1+ on the left side.      Brachioradialis reflexes are 1+ on the right side and 1+ on the left side.      Patellar reflexes are 1+ on the right side and 1+ on the left side.      Achilles reflexes are 0 on the right side and 1+ on the left side. Motor strength is 3- right deltoid, 4 at the right bicep tricep 3- at the right finger flexors and finger extensors, 3- left deltoid 4 at  the left bicep tricep and finger flexors and extensors 4+ bilateral hip flexors knee extensors ankle dorsiflexors. Pain inhibition affecting upper extremity manual muscle testing.  Sensation intact bilateral C5 C6-C7-C8 L2-L3-L4 L5-S1 dermatome distribution.  Sit to stand is slow her gait is short step no evidence of toe drag or knee instability.   Skin: Skin is warm and dry. She is not diaphoretic.  No evidence of malar rash no heliotropic rash  Nursing note and vitals reviewed.         Assessment & Plan:  #1.  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  Patient is independent with all her mobility and self-care but it does take longer period of time to perform this.  She would also benefit from learning about joint protection techniques. Referral to PT OT was made We discussed the difference between treating the underlying condition i.e. inflammatory control with disease modifying antirheumatic drugs.  She has not given an adequate trial of this.  I strongly encouraged her to follow-up with her rheumatologist and discuss other options. We discussed pain medication does not address the underlying inflammation and she is understanding this now. We discussed other pain medication options for her I would recommend Butrans patch 10 mcg if urine drug screen is appropriate  I added return gel to be used 4 times daily to both hands.  Physical medicine rehab follow-up in 3 months

## 2017-11-26 NOTE — Addendum Note (Signed)
Addended by: Erick Colace on: 11/26/2017 10:50 AM   Modules accepted: Level of Service

## 2017-11-29 ENCOUNTER — Ambulatory Visit: Payer: Medicaid Other

## 2017-11-29 ENCOUNTER — Ambulatory Visit: Payer: Medicaid Other | Admitting: Physical Medicine & Rehabilitation

## 2017-11-30 ENCOUNTER — Ambulatory Visit: Payer: Medicaid Other | Admitting: Physical Therapy

## 2017-12-02 ENCOUNTER — Telehealth: Payer: Self-pay | Admitting: *Deleted

## 2017-12-02 LAB — TOXASSURE SELECT,+ANTIDEPR,UR

## 2017-12-02 NOTE — Telephone Encounter (Signed)
Urine drug screen for this encounter is consistent for no  controlled  Medications. Last tramadol Rx 08/13/17 by PMP aware.

## 2017-12-03 ENCOUNTER — Ambulatory Visit: Payer: Medicaid Other | Attending: Physical Medicine & Rehabilitation

## 2017-12-03 ENCOUNTER — Other Ambulatory Visit: Payer: Self-pay

## 2017-12-03 DIAGNOSIS — M25571 Pain in right ankle and joints of right foot: Secondary | ICD-10-CM | POA: Diagnosis present

## 2017-12-03 DIAGNOSIS — G8929 Other chronic pain: Secondary | ICD-10-CM | POA: Diagnosis present

## 2017-12-03 DIAGNOSIS — M25572 Pain in left ankle and joints of left foot: Secondary | ICD-10-CM | POA: Insufficient documentation

## 2017-12-03 DIAGNOSIS — M25561 Pain in right knee: Secondary | ICD-10-CM | POA: Diagnosis present

## 2017-12-03 NOTE — Patient Instructions (Signed)
Towel exer toe grips and inver/ever x 25 daily and DF stretch 2-3 reps 2-3x/day standing  10-30 sec stretch

## 2017-12-03 NOTE — Therapy (Signed)
[facility] [address] [place], [place], [address] Phone: [phone]   Fax:  [phone]  Physical Therapy Evaluation  Patient Details  Name: Tanya Owens MRN: [id number] Date of Birth: Aug 20, 1960 Referring Provider: Claudette Laws, MD   Encounter Date: 12/03/2017  PT End of Session - 12/03/17 0703    Visit Number  1    Number of Visits  12    Date for PT Re-Evaluation  01/14/18    Authorization Type  MCD    PT Start Time  0702    PT Stop Time  0745    PT Time Calculation (min)  43 min    Activity Tolerance  Patient limited by pain    Behavior During Therapy  [facility] for tasks assessed/performed       Past Medical History:  Diagnosis Date  . Arthritis 2000  . Diabetes mellitus without complication (HCC) 2005  . Hypercholesteremia 2005  . Hypertension 2005  . Sleep apnea 2010  . Sleep disorder     Past Surgical History:  Procedure Laterality Date  . CARDIAC CATHETERIZATION  10/18/2007   Normal coronaries, systemic hypertension    There were no vitals filed for this visit.   Subjective Assessment - 12/03/17 0711    Subjective  She reports chronic pain in both LE in knees and ankles.  Now more pain in calves.         Limitations  House hold activities;Walking;Standing    How long can you sit comfortably?  As needed    How long can you stand comfortably?  10-15 min    How long can you walk comfortably?  to mail box 150 feet or so    Diagnostic tests  xrays : OA feet and ankles   in past OA in knees    Currently in Pain?  Yes    Pain Score  8     Pain Location  Calf    Pain Orientation  Right;Left    Pain Descriptors / Indicators  Aching    Pain Type  Chronic pain    Pain Onset  More than a month ago    Pain Frequency  Constant    Aggravating Factors   nothing specific    Pain Relieving Factors  medication    Multiple Pain Sites  Yes    Pain Score  9    Pain Location  Ankle    Pain Orientation   Right;Left    Pain Descriptors / Indicators  -- hurts bad    Pain Type  Chronic pain    Pain Onset  More than a month ago    Pain Frequency  Constant    Aggravating Factors   nothing causes pain    Pain Relieving Factors  meds    Pain Score  9    Pain Location  Knee    Pain Orientation  Right    Pain Descriptors / Indicators  -- very hurtful    Pain Type  Chronic pain    Pain Onset  More than a month ago    Pain Frequency  Constant    Aggravating Factors   comes and goes    Pain Relieving Factors  meds         [facility] PT Assessment - 12/03/17 0001      Assessment   Medical Diagnosis  multi joint pain    Referring Provider  Claudette Laws, MD    Onset Date/Surgical Date  -- 2  years    Next MD Visit  02/2018    Prior Therapy  no      Precautions   Precautions  None      Restrictions   Weight Bearing Restrictions  No      Balance Screen   Has the patient fallen in the past 6 months  Yes    How many times?  2 RT ankle gives out getting out of bed    Has the patient had a decrease in activity level because of a fear of falling?   Yes due to pain    Is the patient reluctant to leave their home because of a fear of falling?   No      Home Environment   Living Environment  Private residence    Living Arrangements  Children    Home Access  Stairs to enter    Entrance Stairs-Number of Steps  8    Entrance Stairs-Rails  Right    Home Layout  Two level    Alternate Level Stairs-Number of Steps  12    Alternate Level Stairs-Rails  Right      Prior Function   Level of Independence  Needs assistance with ADLs;Needs assistance with homemaking    Vocation  Unemployed      Cognition   Overall Cognitive Status  Within Functional Limits for tasks assessed      Posture/Postural Control   Posture Comments  bilateal foot pronation, LE ER  in standing      ROM / Strength   AROM / PROM / Strength  AROM;Strength      AROM   AROM Assessment Site  Knee;Ankle    Right/Left Knee   Right;Left    Right Knee Extension  -5    Right Knee Flexion  122    Left Knee Extension  0    Left Knee Flexion  122    Right/Left Ankle  Right;Left    Right Ankle Dorsiflexion  90    Right Ankle Plantar Flexion  50    Right Ankle Inversion  12    Right Ankle Eversion  5    Left Ankle Dorsiflexion  90    Left Ankle Plantar Flexion  55    Left Ankle Inversion  10    Left Ankle Eversion  15      Strength   Strength Assessment Site  Hip;Knee;Ankle    Right/Left Hip  Right;Left    Right Hip Flexion  4/5    Right Hip Extension  4/5    Right Hip External Rotation   4+/5    Right Hip Internal Rotation  4+/5    Right Hip ABduction  4+/5    Right Hip ADduction  4+/5    Left Hip Flexion  4/5    Left Hip Extension  4/5    Left Hip External Rotation  4+/5    Left Hip Internal Rotation  4+/5    Left Hip ABduction  4+/5    Left Hip ADduction  4+/5    Right/Left Knee  Right;Left    Right Knee Flexion  4+/5    Right Knee Extension  4+/5    Left Knee Flexion  4+/5    Left Knee Extension  4+/5    Right/Left Ankle  Left;Right    Right Ankle Dorsiflexion  4/5    Right Ankle Plantar Flexion  3+/5    Right Ankle Inversion  3+/5    Right Ankle Eversion  3+/5  Left Ankle Dorsiflexion  4/5    Left Ankle Plantar Flexion  3+/5    Left Ankle Inversion  3+/5    Left Ankle Eversion  3+/5      Flexibility   Soft Tissue Assessment /Muscle Length  yes    Hamstrings  75 degrees bilateral                Objective measurements completed on examination: See above findings.              PT Education - 12/03/17 0753    Education Details  POC ,HEP    Person(s) Educated  Patient    Methods  Explanation;Demonstration;Verbal cues;Handout    Comprehension  Returned demonstration;Verbalized understanding       PT Short Term Goals - 12/03/17 0750      PT SHORT TERM GOAL #1   Title  she will be independnet with iniitial HEP    Baseline  no program for LE    Time  2     Period  Weeks    Status  New      PT SHORT TERM GOAL #2   Title  She will report pain decreased 10-20 % in calf RT/LT    Baseline  8-9/10 at eval    Time  2    Period  Weeks    Status  New            We will contact OT for shoulder and hand rehab and they may take over PT care at that time but will plan on seeing her for LE for now.    Plan - 12/03/17 0748    Clinical Impression Statement  Ms Locicero presents with bilateral ankle pain and only RT knee pain todau. She reports OA in knees and ankles. She has pes planus and decr strength in ankles and knees with hip strength generally good. She has limtied tolerance to walking and standing.  She also reports neuropathy in feet causing some of the pain. She was wanting  her shoulders addressed but OT was ordered for this.  Sh     Clinical Presentation  Stable    Clinical Decision Making  Low    Rehab Potential  Good    PT Frequency  -- 3 visits     PT Duration  -- over 2 weeks starting in 2 weeks    PT Treatment/Interventions  Moist Heat;Therapeutic exercise;Patient/family education;Manual techniques    PT Next Visit Plan  REview HEp and add strength with bands for ankle and knees     PT Home Exercise Plan  DF stretch , towel exer    Consulted and Agree with Plan of Care  Patient       Patient will benefit from skilled therapeutic intervention in order to improve the following deficits and impairments:  Pain, Postural dysfunction, Decreased strength, Decreased range of motion, Difficulty walking  Visit Diagnosis: Chronic pain of right knee  Pain in right ankle and joints of right foot  Pain in left ankle and joints of left foot     Problem List Patient Active Problem List   Diagnosis Date Noted  . Seronegative arthropathy of multiple sites 07/30/2017  . Encounter for medication management 04/19/2017  . Acromioclavicular joint arthritis 03/26/2017  . Frequent No-show for appointment 12/15/2016  . Cyclic citrullinated  peptide (CCP) antibody positive 10/28/2016  . Obesity (BMI 30-39.9) 11/28/2014  . Type 2 diabetes mellitus without complication (HCC) 11/28/2014  . HTN (hypertension) 06/21/2013  .  Mixed hyperlipidemia 06/21/2013    Caprice Red  PT 12/03/2017, 7:58 AM  [facility] [address] [place], [place], [address] Phone: [phone]   Fax:  [phone]  Name: Tanya Owens MRN: [id number] Date of Birth: 02-25-1961

## 2017-12-13 ENCOUNTER — Encounter: Payer: Self-pay | Admitting: Family Medicine

## 2017-12-13 ENCOUNTER — Other Ambulatory Visit: Payer: Self-pay

## 2017-12-13 ENCOUNTER — Ambulatory Visit (INDEPENDENT_AMBULATORY_CARE_PROVIDER_SITE_OTHER): Payer: Medicaid Other | Admitting: Family Medicine

## 2017-12-13 VITALS — BP 160/90 | HR 71 | Temp 98.6°F | Wt 251.0 lb

## 2017-12-13 DIAGNOSIS — M06041 Rheumatoid arthritis without rheumatoid factor, right hand: Secondary | ICD-10-CM

## 2017-12-13 DIAGNOSIS — M129 Arthropathy, unspecified: Secondary | ICD-10-CM | POA: Diagnosis not present

## 2017-12-13 DIAGNOSIS — M06042 Rheumatoid arthritis without rheumatoid factor, left hand: Secondary | ICD-10-CM | POA: Diagnosis not present

## 2017-12-13 DIAGNOSIS — E119 Type 2 diabetes mellitus without complications: Secondary | ICD-10-CM | POA: Diagnosis not present

## 2017-12-13 DIAGNOSIS — I10 Essential (primary) hypertension: Secondary | ICD-10-CM

## 2017-12-13 DIAGNOSIS — R7989 Other specified abnormal findings of blood chemistry: Secondary | ICD-10-CM | POA: Diagnosis not present

## 2017-12-13 DIAGNOSIS — M1389 Other specified arthritis, multiple sites: Secondary | ICD-10-CM | POA: Diagnosis not present

## 2017-12-13 DIAGNOSIS — R768 Other specified abnormal immunological findings in serum: Secondary | ICD-10-CM

## 2017-12-13 DIAGNOSIS — M0609 Rheumatoid arthritis without rheumatoid factor, multiple sites: Secondary | ICD-10-CM

## 2017-12-13 LAB — POCT GLYCOSYLATED HEMOGLOBIN (HGB A1C): HBA1C, POC (CONTROLLED DIABETIC RANGE): 6.4 % (ref 0.0–7.0)

## 2017-12-13 MED ORDER — LISINOPRIL 40 MG PO TABS: 40.0000 mg | ORAL_TABLET | Freq: Every day | ORAL | 3 refills | Status: DC

## 2017-12-13 MED ORDER — METHOTREXATE SODIUM 5 MG PO TABS: 30.0000 mg | ORAL_TABLET | ORAL | 0 refills | Status: DC

## 2017-12-13 MED ORDER — PREDNISONE 10 MG PO TABS: 10.0000 mg | ORAL_TABLET | Freq: Every day | ORAL | Status: DC

## 2017-12-13 NOTE — Progress Notes (Signed)
    Subjective:  Tanya Owens is a 57 y.o. female who presents to the [facility] today for follow up on multiple issues.  HPI:  Seronegative arthropathy Patient saw rheumatology on 09/20/17 and PM&R on 11/26/17. She reports that she has been compliant on her weekly enbrel injections and methotrexate 60mg  weekly with daily folic acid. Despite this she has had no improvement in her pain. She is therefore still taking prednisone 10mg  daily. She is also taking ibuprofen and tramadol but only as needed. She was given voltaren gel by PM&R as well. She has not heard back regarding any additional pain medications and is wondering if she needs to come off anything for them to add a better pain medication for her.   Diabetes Taking metformin 1000mg  BID, compliant and tolerating well  Diabetic Review of Systems - further diabetic ROS: no polyuria or polydipsia, no chest pain, dyspnea or TIA's, no hypoglycemia, no medication side effects noted.  HTN On amlodipine 10mg  qd, HCTZ 25mg  qd, lisinopril 20mg  qd.  ROS: taking medications as instructed, no medication side effects noted, no TIA's, no chest pain on exertion, no dyspnea on exertion and no swelling of ankles.    ROS: Per HPI  PMH: She reports that she quit smoking about 4 years ago. She has never used smokeless tobacco. She reports that she does not drink alcohol or use drugs.   Objective:  Physical Exam: BP (!) 160/90   Pulse 71   Temp 98.6 F (37 C) (Oral)   Wt 251 lb (113.9 kg)   SpO2 98%   BMI 37.07 kg/m   Gen: NAD, resting comfortably CV: RRR with no murmurs appreciated Pulm: NWOB, CTAB with no crackles, wheezes, or rhonchi GI: Normal bowel sounds present. Soft, Nontender, Nondistended. MSK: no edema, cyanosis, or clubbing noted Skin: warm, dry Neuro: grossly normal, moves all extremities Psych: Normal affect and thought content   Assessment/Plan:  Seronegative arthropathy of multiple sites Followed by rheumatology who started  enbrel and increased methotrexate in May. Patient is still taking daily folic acid supplementation.  Reviewed last rheumatology note which recommended follow-up in early August, patient informed to call and make an appointment.  Advised patient to speak to her rheumatologist regarding daily prednisone use.  Check CMP and CBC today.  Also attempted to call PM&R regarding status of Butrans patch as no documentation of whether this was planned to be started.  Awaiting callback.  Also advised patient to reach out herself.  HTN (hypertension) Elevated today.  Increase lisinopril to 40 mg daily.  Continue amlodipine 10 mg daily and hydrochlorothiazide 25 mg daily.  Follow-up in 3 to 4 weeks for blood pressure recheck and to repeat BMP  Type 2 diabetes mellitus without complication Stable and in good control.  Continue metformin 1000 mg twice daily.   , DO PGY-3, [facility] 12/13/2017 2:52 PM

## 2017-12-13 NOTE — Assessment & Plan Note (Addendum)
Followed by rheumatology who started enbrel and increased methotrexate in May. Patient is still taking daily folic acid supplementation.  Reviewed last rheumatology note which recommended follow-up in early August, patient informed to call and make an appointment.  Advised patient to speak to her rheumatologist regarding daily prednisone use.  Check CMP and CBC today.  Also attempted to call PM&R regarding status of Butrans patch as no documentation of whether this was planned to be started.  Awaiting callback.  Also advised patient to reach out herself.

## 2017-12-13 NOTE — Patient Instructions (Addendum)
Call your rheumatologist and make an appointment for the beginning of August. Ask about prednisone, if you stay on it daily or if you should taper off. Hilda Lias [facility] PA-C (Attending) [phone] (Work) [phone] (Fax) [address] [place], [place] [address]  We will need to touch base with your PM&R doctor regarding pain meds.   Increase lisinopril to 40mg  daily.  We are checking some labs today. If results require attention, either myself or my nurse will get in touch with you. If everything is normal, you will get a letter in the mail or a message in My Chart. Please give a call if you do not hear from [place] after 2 weeks.   Come back to see me in a 3-4 weeks for blood pressure recheck.

## 2017-12-13 NOTE — Assessment & Plan Note (Deleted)
Followed by rheumatology who started enbrel and increased methotrexate in May. Patient is still taking daily folic acid supplementation.  Reviewed last rheumatology note which recommended follow-up in early August, patient informed to call and make an appointment.  Check CMP and CBC today.  Also attempted to call PM&R regarding status of Butrans patch as no documentation of whether this was planned to be started.  Awaiting callback.  Also advised patient to reach out herself.

## 2017-12-13 NOTE — Assessment & Plan Note (Signed)
Elevated today.  Increase lisinopril to 40 mg daily.  Continue amlodipine 10 mg daily and hydrochlorothiazide 25 mg daily.  Follow-up in 3 to 4 weeks for blood pressure recheck and to repeat BMP

## 2017-12-13 NOTE — Assessment & Plan Note (Signed)
Stable and in good control.  Continue metformin 1000 mg twice daily.

## 2017-12-14 ENCOUNTER — Telehealth: Payer: Self-pay

## 2017-12-14 ENCOUNTER — Telehealth: Payer: Self-pay | Admitting: Family Medicine

## 2017-12-14 ENCOUNTER — Encounter: Payer: Self-pay | Admitting: Family Medicine

## 2017-12-14 LAB — CBC
HEMOGLOBIN: 11.5 g/dL (ref 11.1–15.9)
Hematocrit: 35.4 % (ref 34.0–46.6)
MCH: 26 pg — ABNORMAL LOW (ref 26.6–33.0)
MCHC: 32.5 g/dL (ref 31.5–35.7)
MCV: 80 fL (ref 79–97)
Platelets: 269 10*3/uL (ref 150–450)
RBC: 4.43 x10E6/uL (ref 3.77–5.28)
RDW: 15.6 % — ABNORMAL HIGH (ref 12.3–15.4)
WBC: 7.8 10*3/uL (ref 3.4–10.8)

## 2017-12-14 LAB — COMPREHENSIVE METABOLIC PANEL
ALBUMIN: 4.4 g/dL (ref 3.5–5.5)
ALT: 20 IU/L (ref 0–32)
AST: 18 IU/L (ref 0–40)
Albumin/Globulin Ratio: 1.6 (ref 1.2–2.2)
Alkaline Phosphatase: 70 IU/L (ref 39–117)
BUN / CREAT RATIO: 13 (ref 9–23)
BUN: 9 mg/dL (ref 6–24)
Bilirubin Total: 0.3 mg/dL (ref 0.0–1.2)
CHLORIDE: 100 mmol/L (ref 96–106)
CO2: 23 mmol/L (ref 20–29)
Calcium: 9.5 mg/dL (ref 8.7–10.2)
Creatinine, Ser: 0.67 mg/dL (ref 0.57–1.00)
GFR calc non Af Amer: 97 mL/min/{1.73_m2} (ref >59)
GFR, EST AFRICAN AMERICAN: 111 mL/min/{1.73_m2} (ref >59)
GLOBULIN, TOTAL: 2.7 g/dL (ref 1.5–4.5)
GLUCOSE: 79 mg/dL (ref 65–99)
POTASSIUM: 4 mmol/L (ref 3.5–5.2)
Sodium: 141 mmol/L (ref 134–144)
TOTAL PROTEIN: 7.1 g/dL (ref 6.0–8.5)

## 2017-12-14 NOTE — Telephone Encounter (Signed)
Opened in error

## 2017-12-14 NOTE — Telephone Encounter (Signed)
Tanya Owens, pt PCP called asking if pt is going to be started on Butrans patch. Last note says recommend Butrans patch pending UDS. Last UDS results: Urine drug screen for this encounter is consistent for no  controlled  Medications. Last tramadol Rx 08/13/17 by PMP aware.  Dr. Artist Pais wants to be notified if going to start or not.

## 2017-12-20 MED ORDER — BUPRENORPHINE 10 MCG/HR TD PTWK: 10.0000 ug | MEDICATED_PATCH | TRANSDERMAL | 2 refills | Status: DC

## 2017-12-20 NOTE — Telephone Encounter (Signed)
Called to the pharmacy. I have notified Tanya Owens and reviewed changing every 7 days, alternate sites and to be sure to take old patch off before placing new patch.  I will forward this message to Dr Olegario Messier inbox so that she will be aware we have started therapy.

## 2017-12-20 NOTE — Telephone Encounter (Signed)
Buttrans patch cahnge weekly #4 2 RF

## 2017-12-22 ENCOUNTER — Ambulatory Visit: Payer: Medicaid Other | Attending: Physical Medicine & Rehabilitation

## 2017-12-30 ENCOUNTER — Telehealth: Payer: Self-pay | Admitting: Physical Therapy

## 2017-12-30 ENCOUNTER — Ambulatory Visit: Payer: Medicaid Other

## 2017-12-30 NOTE — Telephone Encounter (Signed)
Pt reports with flat tire on side of road so could not make this appointment. She will need reassessment and new request for [employer] auth to have more visits. She said she would call to schedule.

## 2018-01-05 ENCOUNTER — Telehealth: Payer: Self-pay | Admitting: Family Medicine

## 2018-01-05 NOTE — Telephone Encounter (Signed)
RX traMadol 50mg  tablet fill 06/15/17 never picked up - [facility]

## 2018-01-06 ENCOUNTER — Encounter: Payer: Medicaid Other | Admitting: Physical Therapy

## 2018-01-10 ENCOUNTER — Ambulatory Visit: Payer: Medicaid Other | Admitting: Family Medicine

## 2018-01-24 ENCOUNTER — Ambulatory Visit: Payer: Medicaid Other | Attending: Physical Medicine & Rehabilitation

## 2018-01-24 ENCOUNTER — Other Ambulatory Visit: Payer: Self-pay

## 2018-01-24 DIAGNOSIS — M25561 Pain in right knee: Secondary | ICD-10-CM | POA: Diagnosis present

## 2018-01-24 DIAGNOSIS — G8929 Other chronic pain: Secondary | ICD-10-CM | POA: Insufficient documentation

## 2018-01-24 DIAGNOSIS — M25571 Pain in right ankle and joints of right foot: Secondary | ICD-10-CM | POA: Diagnosis present

## 2018-01-24 DIAGNOSIS — M25572 Pain in left ankle and joints of left foot: Secondary | ICD-10-CM | POA: Diagnosis present

## 2018-01-24 NOTE — Therapy (Signed)
[facility] [address] [place], [place], [address] Phone: [phone]   Fax:  [phone]  Physical Therapy Treatment/ Re Eval  Patient Details  Name: Tanya Owens MRN: [id number] Date of Birth: 07-Jul-1960 Referring Provider: Claudette Laws, MD   Encounter Date: 01/24/2018  PT End of Session - 01/24/18 1019    Visit Number  2    Number of Visits  13    Date for PT Re-Evaluation  03/11/18    Authorization Type  MCD    PT Start Time  1015    PT Stop Time  1100    PT Time Calculation (min)  45 min    Activity Tolerance  Patient limited by pain    Behavior During Therapy  [facility] for tasks assessed/performed       Past Medical History:  Diagnosis Date  . Arthritis 2000  . Diabetes mellitus without complication (HCC) 2005  . Hypercholesteremia 2005  . Hypertension 2005  . Sleep apnea 2010  . Sleep disorder     Past Surgical History:  Procedure Laterality Date  . CARDIAC CATHETERIZATION  10/18/2007   Normal coronaries, systemic hypertension    There were no vitals filed for this visit.  Subjective Assessment - 01/24/18 1022    Subjective  She reports everything same and doing HEP. Still with pain in calf and ankles main problem.  She has not contacted OT.  She has transportation issues.   Ankles give out  at times so falls to floor.     Limitations  House hold activities;Walking;Standing    How long can you sit comfortably?  As needed    How long can you stand comfortably?  10-15 min    How long can you walk comfortably?  to mail box 150 feet or so    Diagnostic tests  xrays : OA feet and ankles   in past OA in knees    Patient Stated Goals  She wants to walk  better.     Currently in Pain?  Yes    Pain Score  6     Pain Location  Calf    Pain Orientation  Right;Left    Pain Descriptors / Indicators  Aching    Pain Type  Chronic pain    Pain Onset  More than a month ago    Pain Frequency  Constant    Aggravating Factors   being on feet.     Pain Relieving Factors  medication    Pain Score  9    Pain Location  Ankle    Pain Orientation  Left;Right    Pain Descriptors / Indicators  Throbbing    Pain Type  Chronic pain    Pain Onset  More than a month ago    Pain Frequency  Constant    Aggravating Factors   nothing but walking at times    Pain Relieving Factors  meds. elevation.     Pain Score  7    Pain Location  Knee    Pain Orientation  --   R>L   Pain Descriptors / Indicators  --   real bad   Pain Type  Chronic pain    Pain Onset  More than a month ago    Aggravating Factors   comes and goes    Pain Relieving Factors  meds , heat         [facility] PT Assessment - 01/24/18 0001      Assessment  Medical Diagnosis  multi joint pain    Referring Provider  Claudette Laws, MD    Onset Date/Surgical Date  --   2 years   Next MD Visit  02/25/18      Restrictions   Weight Bearing Restrictions  No      Balance Screen   Has the patient fallen in the past 6 months  Yes    How many times?  3   getting out of bed   Has the patient had a decrease in activity level because of a fear of falling?   Yes   due to pain   Is the patient reluctant to leave their home because of a fear of falling?   No      Home Environment   Living Environment  Private residence    Living Arrangements  Children    Home Access  Stairs to enter    Entrance Stairs-Number of Steps  8    Entrance Stairs-Rails  Right    Home Layout  Two level    Alternate Level Stairs-Number of Steps  12    Alternate Level Stairs-Rails  Right      Prior Function   Level of Independence  Needs assistance with ADLs;Needs assistance with homemaking    Vocation  Unemployed      Cognition   Overall Cognitive Status  Within Functional Limits for tasks assessed      AROM   Right Knee Extension  -5    Right Knee Flexion  122    Left Knee Extension  0    Left Knee Flexion  122    Right Ankle Dorsiflexion  90     Right Ankle Plantar Flexion  50    Right Ankle Inversion  18    Left Ankle Dorsiflexion  90    Left Ankle Plantar Flexion  52    Left Ankle Inversion  10    Left Ankle Eversion  20      Strength   Right Hip Flexion  --   5-/5   Right Hip Extension  4/5    Right Hip External Rotation   5/5    Right Hip Internal Rotation  5/5    Right Hip ABduction  --   5-/5   Right Hip ADduction  4+/5    Left Hip Flexion  --   5-/5   Left Hip Extension  4/5    Left Hip External Rotation  5/5    Left Hip Internal Rotation  5/5    Left Hip ABduction  --   5-/5   Right Knee Flexion  5/5    Right Knee Extension  5/5    Left Knee Flexion  4+/5    Left Knee Extension  4+/5    Right Ankle Dorsiflexion  5/5    Right Ankle Plantar Flexion  3+/5    Right Ankle Inversion  5/5    Right Ankle Eversion  5/5    Left Ankle Dorsiflexion  5/5    Left Ankle Plantar Flexion  3+/5    Left Ankle Inversion  5/5    Left Ankle Eversion  5/5      Flexibility   Hamstrings  75 degrees bilateral      Ambulation/Gait   Gait Comments  No limp or antalgia. Feet pronated and Lower leg ER.                    [facility] Adult PT Treatment/Exercise - 01/24/18  0001      Exercises   Exercises  Ankle      Ankle Exercises: Stretches   Gastroc Stretch  1 rep;30 seconds   reviewed and able to do               PT Short Term Goals - 01/24/18 1145      PT SHORT TERM GOAL #1   Title  she will be independnet with iniitial HEP    Baseline  started program for LE    Time  2    Period  Weeks    Status  New      PT SHORT TERM GOAL #2   Title  She will report pain decreased 10-20 % in calf RT/LT    Baseline  7 at re-eval    Time  2    Period  Weeks    Status  New        PT Long Term Goals - 01/24/18 1146      PT LONG TERM GOAL #1   Title  She will be independent with all HEP issued     Baseline  She is independent with initial HEP    Time  6    Period  Weeks    Status  New      PT LONG  TERM GOAL #2   Title  She will report annkle pain decr 40% or more with walking    Baseline  ankle pain 9/10 at eval    Time  6    Period  Weeks    Status  New      PT LONG TERM GOAL #3   Title  She will report knee pain as intermittant and decr 40% or more    Baseline  knee pain  7/10 at eval    Time  6    Period  Weeks    Status  New      PT LONG TERM GOAL #4   Title  She will be able to walk 500 feet por more without increased knee or ankle pain    Baseline  150 feet at re-eval    Time  6    Period  Weeks    Status  New            Plan - 01/24/18 1103    Clinical Impression Statement  Ms Bertolucci returns after eval . She reports she is essentialy the same . She has significant report of knee pain and ankle pain. The levels reported do not match her affect .  She has done the HEp issued but needed cueing for correct technique.  due to significant pronation and postureal changes she in long rum may not have any significant improvement without use of orthotics/shoes . She comes in with flip flops today.    Will go with original plan as she has had transportation difficulty that she reports is resolved.     PT Frequency  --   3 visits   PT Duration  --   over 2 weeks then 2x/week over 4 weeks   PT Treatment/Interventions  Moist Heat;Therapeutic exercise;Patient/family education;Manual techniques    PT Next Visit Plan  REview HEp and add strength with bands for ankle and knees     PT Home Exercise Plan  DF stretch , towel exer    Consulted and Agree with Plan of Care  Patient       Patient will benefit from skilled therapeutic intervention in  order to improve the following deficits and impairments:  Pain, Postural dysfunction, Decreased strength, Decreased range of motion, Difficulty walking  Visit Diagnosis: Chronic pain of right knee  Pain in right ankle and joints of right foot  Pain in left ankle and joints of left foot     Problem List Patient Active Problem  List   Diagnosis Date Noted  . Seronegative arthropathy of multiple sites 07/30/2017  . Encounter for medication management 04/19/2017  . Acromioclavicular joint arthritis 03/26/2017  . Frequent No-show for appointment 12/15/2016  . Cyclic citrullinated peptide (CCP) antibody positive 10/28/2016  . Obesity (BMI 30-39.9) 11/28/2014  . Type 2 diabetes mellitus without complication (HCC) 11/28/2014  . HTN (hypertension) 06/21/2013  . Mixed hyperlipidemia 06/21/2013    Caprice Red  PT 01/24/2018, 11:59 AM  [facility] [address] [place], [place], [address] Phone: [phone]   Fax:  [phone]  Name: Tanya Owens MRN: [id number] Date of Birth: Jan 11, 1961

## 2018-02-01 ENCOUNTER — Ambulatory Visit: Payer: Medicaid Other

## 2018-02-01 DIAGNOSIS — M25572 Pain in left ankle and joints of left foot: Secondary | ICD-10-CM

## 2018-02-01 DIAGNOSIS — M25561 Pain in right knee: Principal | ICD-10-CM

## 2018-02-01 DIAGNOSIS — G8929 Other chronic pain: Secondary | ICD-10-CM

## 2018-02-01 DIAGNOSIS — M25571 Pain in right ankle and joints of right foot: Secondary | ICD-10-CM

## 2018-02-01 NOTE — Patient Instructions (Signed)
Issued modificaiton of heel cord stretch to include shoulders in doorway and also accordian breathing and shoulder /scapula depression and retraction and trunk rotaiton 3-5 rpes 2-3-x/day

## 2018-02-01 NOTE — Therapy (Signed)
[facility] [address] [place], [place], [address] Phone: [phone]   Fax:  [phone]  Physical Therapy Treatment  Patient Details  Name: Tanya Owens MRN: [id number] Date of Birth: [birth year] Referring Provider: Claudette Laws, MD   Encounter Date: 02/01/2018  PT End of Session - 02/01/18 0843    Visit Number  3    Number of Visits  13    Date for PT Re-Evaluation  03/11/18    Authorization Type  MCD    Authorization Time Period  3 visits  01/31/18  to 02/13/18    Authorization - Visit Number  1    Authorization - Number of Visits  3    PT Start Time  [phone]    PT Stop Time  0930    PT Time Calculation (min)  48 min    Activity Tolerance  Patient limited by pain    Behavior During Therapy  [facility] for tasks assessed/performed       Past Medical History:  Diagnosis Date  . Arthritis 2000  . Diabetes mellitus without complication (HCC) 2005  . Hypercholesteremia 2005  . Hypertension 2005  . Sleep apnea 2010  . Sleep disorder     Past Surgical History:  Procedure Laterality Date  . CARDIAC CATHETERIZATION  10/18/2007   Normal coronaries, systemic hypertension    There were no vitals filed for this visit.  Subjective Assessment - 02/01/18 0848    Subjective  RT knee 8 , shoulders 9, hands hurting, feet hurting.  Medication did not help    Currently in Pain?  Yes    Pain Score  8     Pain Location  Knee    Pain Orientation  Right    Pain Descriptors / Indicators  Aching    Pain Type  Chronic pain    Pain Onset  More than a month ago    Pain Frequency  Constant    Aggravating Factors   being on feet    Pain Relieving Factors  meds    Pain Score  9    Pain Location  Ankle   and feet   Pain Orientation  Right;Left    Pain Descriptors / Indicators  Throbbing    Pain Type  Chronic pain    Pain Onset  More than a month ago    Pain Frequency  Constant    Aggravating Factors   Nothing.     Pain Relieving  Factors  meds     Pain Score  9    Pain Location  Shoulder    Pain Orientation  Right;Left    Pain Descriptors / Indicators  --   bad   Pain Type  Chronic pain    Pain Onset  More than a month ago    Pain Frequency  Constant                       [facility] Adult PT Treatment/Exercise - 02/01/18 0001      Exercises   Exercises  Knee/Hip;Shoulder      Knee/Hip Exercises: Seated   Long Arc Quad  Right;Left;10 reps    Long Arc Quad Limitations  5 sec hold      Knee/Hip Exercises: Supine   Bridges  15 reps      Shoulder Exercises: Seated   Other Seated Exercises  scapula retraction with depression with deep breathing multiple reps for HEP then trunk rotation RT/LT  with breathing  as tolerated. mltiple reps       Ankle Exercises: Aerobic   Nustep  L4 5 min LE.       Ankle Exercises: Seated   Ankle Circles/Pumps  Right;Left;15 reps   cued for breathing.      Ankle Exercises: Stretches   Gastroc Stretch  1 rep;30 seconds   with modifications to decr hand pain            PT Education - 02/01/18 0930    Education Details  HEP    Person(s) Educated  Patient    Methods  Explanation;Demonstration;Tactile cues;Verbal cues;Handout    Comprehension  Returned demonstration;Verbalized understanding       PT Short Term Goals - 01/24/18 1145      PT SHORT TERM GOAL #1   Title  she will be independnet with iniitial HEP    Baseline  started program for LE    Time  2    Period  Weeks    Status  New      PT SHORT TERM GOAL #2   Title  She will report pain decreased 10-20 % in calf RT/LT    Baseline  7 at re-eval    Time  2    Period  Weeks    Status  New        PT Long Term Goals - 01/24/18 1146      PT LONG TERM GOAL #1   Title  She will be independent with all HEP issued     Baseline  She is independent with initial HEP    Time  6    Period  Weeks    Status  New      PT LONG TERM GOAL #2   Title  She will report annkle pain decr 40% or more with  walking    Baseline  ankle pain 9/10 at eval    Time  6    Period  Weeks    Status  New      PT LONG TERM GOAL #3   Title  She will report knee pain as intermittant and decr 40% or more    Baseline  knee pain  7/10 at eval    Time  6    Period  Weeks    Status  New      PT LONG TERM GOAL #4   Title  She will be able to walk 500 feet por more without increased knee or ankle pain    Baseline  150 feet at re-eval    Time  6    Period  Weeks    Status  New            Plan - 02/01/18 0850    Clinical Impression Statement  Pain bad today all over.  Meds don't help.    No increased pain after session.        progress HEP as able    PT Treatment/Interventions  Moist Heat;Therapeutic exercise;Patient/family education;Manual techniques    PT Next Visit Plan  REview HEp and add strength with bands for ankle and knees     PT Home Exercise Plan  DF stretch , towel exer, Shoulder retraction and depression and trunk rotation    Consulted and Agree with Plan of Care  Patient       Patient will benefit from skilled therapeutic intervention in order to improve the following deficits and impairments:  Pain, Postural dysfunction, Decreased strength, Decreased range of motion,  Difficulty walking  Visit Diagnosis: Chronic pain of right knee  Pain in right ankle and joints of right foot  Pain in left ankle and joints of left foot     Problem List Patient Active Problem List   Diagnosis Date Noted  . Seronegative arthropathy of multiple sites 07/30/2017  . Encounter for medication management 04/19/2017  . Acromioclavicular joint arthritis 03/26/2017  . Frequent No-show for appointment 12/15/2016  . Cyclic citrullinated peptide (CCP) antibody positive 10/28/2016  . Obesity (BMI 30-39.9) 11/28/2014  . Type 2 diabetes mellitus without complication (HCC) 11/28/2014  . HTN (hypertension) 06/21/2013  . Mixed hyperlipidemia 06/21/2013    Caprice Red  PT 02/01/2018, 11:33  AM  [facility] [address] [place], [place], [address] Phone: [phone]   Fax:  [phone]  Name: Tanya Owens MRN: [id number] Date of Birth: 06/15/1960

## 2018-02-07 ENCOUNTER — Ambulatory Visit: Payer: Medicaid Other

## 2018-02-07 ENCOUNTER — Encounter: Payer: Self-pay | Admitting: Physical Therapy

## 2018-02-07 ENCOUNTER — Ambulatory Visit: Payer: Medicaid Other | Admitting: Physical Therapy

## 2018-02-07 DIAGNOSIS — G8929 Other chronic pain: Secondary | ICD-10-CM

## 2018-02-07 DIAGNOSIS — M25561 Pain in right knee: Secondary | ICD-10-CM | POA: Diagnosis not present

## 2018-02-07 DIAGNOSIS — M25572 Pain in left ankle and joints of left foot: Secondary | ICD-10-CM

## 2018-02-07 DIAGNOSIS — M25571 Pain in right ankle and joints of right foot: Secondary | ICD-10-CM

## 2018-02-07 NOTE — Therapy (Signed)
[facility] [address] [place], [place], [address] Phone: [phone]   Fax:  [phone]  Physical Therapy Treatment  Patient Details  Name: Tanya Owens MRN: [id number] Date of Birth: 22-Dec-1960 Referring Provider: Claudette Laws, MD   Encounter Date: 02/07/2018  PT End of Session - 02/07/18 1409    Visit Number  4    Number of Visits  13    Date for PT Re-Evaluation  03/11/18    Authorization Type  MCD    Authorization Time Period  3 visits  01/31/18  to 02/13/18    Authorization - Visit Number  3    Authorization - Number of Visits  3    PT Start Time  1330    PT Stop Time  1409    PT Time Calculation (min)  39 min    Activity Tolerance  Patient tolerated treatment well    Behavior During Therapy  [facility] for tasks assessed/performed       Past Medical History:  Diagnosis Date  . Arthritis 2000  . Diabetes mellitus without complication (HCC) 2005  . Hypercholesteremia 2005  . Hypertension 2005  . Sleep apnea 2010  . Sleep disorder     Past Surgical History:  Procedure Laterality Date  . CARDIAC CATHETERIZATION  10/18/2007   Normal coronaries, systemic hypertension    There were no vitals filed for this visit.  Subjective Assessment - 02/07/18 1325    Subjective  "My knee's been bothering me all night and my shoulders are the worse than anything. I had to change my appointment this morning because my knee pain was so bad"     Currently in Pain?  Yes    Pain Score  7    6am took 6 ibuprofen   Pain Location  Knee    Pain Orientation  Right    Pain Descriptors / Indicators  Aching    Pain Type  Chronic pain    Pain Onset  More than a month ago    Pain Frequency  Constant    Aggravating Factors   anything, worse at night    Pain Relieving Factors  meds    Pain Score  6    Pain Orientation  Right;Left    Pain Descriptors / Indicators  Aching;Sore;Throbbing    Pain Type  Chronic pain    Pain Onset  More  than a month ago    Pain Frequency  Constant    Aggravating Factors   anything, worse at night    Pain Relieving Factors  meds,     Pain Score  6    Pain Location  Shoulder    Pain Orientation  Right;Left    Pain Descriptors / Indicators  Aching;Sore    Pain Onset  More than a month ago    Pain Frequency  Intermittent    Aggravating Factors   comes and goes, worse at night    Pain Relieving Factors  meds, heat         [facility] PT Assessment - 02/07/18 1331      Assessment   Medical Diagnosis  multi joint pain    Referring Provider  Claudette Laws, MD    Next MD Visit  02/25/18    Prior Therapy  no      Precautions   Precautions  None      Restrictions   Weight Bearing Restrictions  No      AROM   Right Knee Extension  -  5    Right Knee Flexion  111   ERP   Left Knee Extension  0    Left Knee Flexion  119    Right Ankle Dorsiflexion  -1   from neutral   Right Ankle Plantar Flexion  58    Right Ankle Inversion  8    Right Ankle Eversion  18    Left Ankle Dorsiflexion  10   from neutral (90)   Left Ankle Plantar Flexion  49    Left Ankle Inversion  10    Left Ankle Eversion  12      Strength   Right Hip Flexion  4/5    Right Hip Extension  4/5    Right Hip ABduction  4+/5    Right Hip ADduction  4+/5    Left Hip Flexion  4/5    Left Hip Extension  4/5    Left Hip ABduction  4+/5    Left Hip ADduction  4+/5    Right Ankle Dorsiflexion  5/5    Right Ankle Plantar Flexion  4-/5    Right Ankle Inversion  3+/5   limited mobility   Right Ankle Eversion  5/5    Left Ankle Dorsiflexion  5/5    Left Ankle Plantar Flexion  4-/5    Left Ankle Inversion  3+/5   limited mobility   Left Ankle Eversion  5/5                   [facility] Adult PT Treatment/Exercise - 02/07/18 0001      Exercises   Exercises  Knee/Hip      Knee/Hip Exercises: Seated   Long Arc Quad  2 sets;10 reps   1 set with 3# weight, 1 with green theraband   Long Arc Quad Limitations   focus on eccentric loading     Sit to Sand  2 sets;10 reps   with ball between knees to promote alignment     Manual Therapy   Manual Therapy  Soft tissue mobilization    Manual therapy comments  MTPR along the rectus femoris x 3    Soft tissue mobilization  IASTM along the patellar tendon and rectus femoris             PT Education - 02/07/18 1412    Education Details  reviewed previously provided HEP and discussed importance of alignment and proper form with exercise especially with sit to stand exercise using ball between knees.     Person(s) Educated  Patient    Methods  Explanation;Verbal cues;Demonstration    Comprehension  Verbalized understanding;Verbal cues required;Returned demonstration       PT Short Term Goals - 02/07/18 1345      PT SHORT TERM GOAL #1   Title  she will be independnet with iniitial HEP    Baseline  reports she is consistent with her exercises    Time  2    Period  Weeks      PT SHORT TERM GOAL #2   Title  She will report pain decreased 10-20 % in calf RT/LT    Baseline  today 6/10 which is 10% drop compared revaulation being 7/10    Time  2    Period  Weeks    Status  Achieved        PT Long Term Goals - 02/07/18 1346      PT LONG TERM GOAL #1   Title  She will be independent with  all HEP issued     Baseline  Indepent with current HEP progress as able    Time  6    Period  Weeks    Status  On-going    Target Date  02/28/18      PT LONG TERM GOAL #2   Title  She will report annkle pain decr 40% or more with walking    Baseline  pain at 6/10     Time  6    Period  Weeks    Status  On-going    Target Date  02/28/18      PT LONG TERM GOAL #3   Title  She will report knee pain as intermittant and decr 40% or more    Baseline  pain in the knee 6/10 but had to take medication this morning to calm down    Time  6    Period  Weeks    Status  On-going    Target Date  02/28/18      PT LONG TERM GOAL #4   Title  She will be able  to walk 500 feet por more without increased knee or ankle pain    Baseline  able to walk 500 ft but pain goes up to a 10/10    Time  6    Period  Weeks    Status  On-going    Target Date  02/28/18            Plan - 02/07/18 1410    Clinical Impression Statement  Tanya Owens reported significant increase in pain in the R knee this morning. She demonstrates limited progress with ankle mobility with mild regression of R knee flexion due to pain and soreness today. she has made progress meeting her short term goals and demonstrates motivation to continue to improve with mobility. plan to continue with physical therapy to increase knee / ankle strength and mobility to maximize function. Discussed if no progress is made over the course of the next 3 weeks then may discharge back to referring MD.     PT Frequency  2x / week    PT Duration  3 weeks    PT Treatment/Interventions  Moist Heat;Therapeutic exercise;Patient/family education;Manual techniques    PT Next Visit Plan  REview HEp and add strength with bands for ankle and knees     PT Home Exercise Plan  DF stretch , towel exer, Shoulder retraction and depression and trunk rotation    Consulted and Agree with Plan of Care  Patient       Patient will benefit from skilled therapeutic intervention in order to improve the following deficits and impairments:  Pain, Postural dysfunction, Decreased strength, Decreased range of motion, Difficulty walking  Visit Diagnosis: Chronic pain of right knee  Pain in right ankle and joints of right foot  Pain in left ankle and joints of left foot     Problem List Patient Active Problem List   Diagnosis Date Noted  . Seronegative arthropathy of multiple sites 07/30/2017  . Encounter for medication management 04/19/2017  . Acromioclavicular joint arthritis 03/26/2017  . Frequent No-show for appointment 12/15/2016  . Cyclic citrullinated peptide (CCP) antibody positive 10/28/2016  . Obesity (BMI  30-39.9) 11/28/2014  . Type 2 diabetes mellitus without complication (HCC) 11/28/2014  . HTN (hypertension) 06/21/2013  . Mixed hyperlipidemia 06/21/2013   Lulu Riding PT, DPT, LAT, ATC  02/07/18  2:16 PM       [facility]  [facility] [address] [place], [place], [address] Phone: [phone]   Fax:  [phone]  Name: Tanya Owens MRN: [id number] Date of Birth: 1961/01/19

## 2018-02-12 ENCOUNTER — Emergency Department (HOSPITAL_COMMUNITY)
Admission: EM | Admit: 2018-02-12 | Discharge: 2018-02-12 | Disposition: A | Discharge status: Home / Self Care | Payer: Medicaid Other | Attending: Emergency Medicine | Admitting: Emergency Medicine

## 2018-02-12 ENCOUNTER — Other Ambulatory Visit: Payer: Self-pay

## 2018-02-12 DIAGNOSIS — Z87891 Personal history of nicotine dependence: Secondary | ICD-10-CM | POA: Insufficient documentation

## 2018-02-12 DIAGNOSIS — E119 Type 2 diabetes mellitus without complications: Secondary | ICD-10-CM | POA: Diagnosis not present

## 2018-02-12 DIAGNOSIS — I10 Essential (primary) hypertension: Secondary | ICD-10-CM | POA: Diagnosis not present

## 2018-02-12 DIAGNOSIS — L299 Pruritus, unspecified: Secondary | ICD-10-CM | POA: Diagnosis not present

## 2018-02-12 DIAGNOSIS — R21 Rash and other nonspecific skin eruption: Secondary | ICD-10-CM

## 2018-02-12 DIAGNOSIS — Z79899 Other long term (current) drug therapy: Secondary | ICD-10-CM | POA: Insufficient documentation

## 2018-02-12 MED ORDER — NYSTATIN-TRIAMCINOLONE 100000-0.1 UNIT/GM-% EX OINT: 1.0000 "application " | TOPICAL_OINTMENT | Freq: Two times a day (BID) | CUTANEOUS | 0 refills | Status: DC

## 2018-02-12 MED ORDER — NYSTATIN-TRIAMCINOLONE 100000-0.1 UNIT/GM-% EX OINT: 1.0000 "application " | TOPICAL_OINTMENT | Freq: Two times a day (BID) | CUTANEOUS | 0 refills | Status: AC

## 2018-02-12 NOTE — ED Provider Notes (Signed)
[facility]-EMERGENCY DEPT Provider Note   CSN: [id number] Arrival date & time: 02/12/18  9611     History   Chief Complaint Chief Complaint  Patient presents with  . Rash    HPI Tanya Owens is a 57 y.o. female.  The history is provided by the patient.  Rash   This is a chronic problem. The current episode started more than 1 week ago. The problem has been gradually worsening. The problem is associated with an unknown factor. There has been no fever. The fever has been present for less than 1 day. Affected Location: under breasts and right groin. The pain is at a severity of 0/10. The patient is experiencing no pain. The pain has been fluctuating since onset. Associated symptoms include itching. She has tried OTC analgesics (corn startch, OTC creams) for the symptoms. The treatment provided no relief. Risk factors: none.    Past Medical History:  Diagnosis Date  . Arthritis 2000  . Diabetes mellitus without complication (HCC) 2005  . Hypercholesteremia 2005  . Hypertension 2005  . Sleep apnea 2010  . Sleep disorder     Patient Active Problem List   Diagnosis Date Noted  . Seronegative arthropathy of multiple sites 07/30/2017  . Encounter for medication management 04/19/2017  . Acromioclavicular joint arthritis 03/26/2017  . Frequent No-show for appointment 12/15/2016  . Cyclic citrullinated peptide (CCP) antibody positive 10/28/2016  . Obesity (BMI 30-39.9) 11/28/2014  . Type 2 diabetes mellitus without complication (HCC) 11/28/2014  . HTN (hypertension) 06/21/2013  . Mixed hyperlipidemia 06/21/2013    Past Surgical History:  Procedure Laterality Date  . CARDIAC CATHETERIZATION  10/18/2007   Normal coronaries, systemic hypertension     OB History   None      Home Medications    Prior to Admission medications   Medication Sig Start Date End Date Taking? Authorizing Provider  amLODipine (NORVASC) 10 MG tablet Take 1 tablet (10 mg  total) by mouth daily. 11/19/17   Leland Her, DO  atorvastatin (LIPITOR) 40 MG tablet take 1 tablet by mouth once daily 09/22/17   Leland Her, DO  buprenorphine (BUTRANS - DOSED MCG/HR) 10 MCG/HR PTWK patch Place 1 patch (10 mcg total) onto the skin once a week. 12/20/17   Kirsteins, Victorino Sparrow, MD  diclofenac sodium (VOLTAREN) 1 % GEL Apply 2 g topically 4 (four) times daily. 11/26/17   Kirsteins, Victorino Sparrow, MD  Etanercept (ENBREL MINI) 50 MG/ML SOCT Inject into the skin. 09/20/17   [provider]  folic acid (FOLVITE) 1 MG tablet Take 1 tablet (1 mg total) by mouth daily. 11/19/17 11/19/18  Leland Her, DO  hydrochlorothiazide (HYDRODIURIL) 25 MG tablet Take 1 tablet (25 mg total) by mouth daily. 11/19/17   Leland Her, DO  lisinopril (PRINIVIL,ZESTRIL) 40 MG tablet Take 1 tablet (40 mg total) by mouth daily. 12/13/17   Leland Her, DO  metFORMIN (GLUCOPHAGE) 1000 MG tablet Take 1 tablet (1,000 mg total) by mouth 2 (two) times daily with a meal. 09/22/17 03/21/18  Leland Her, DO  methotrexate (RHEUMATREX) 5 MG tablet Take 6 tablets (30 mg total) by mouth once a week. Caution: Chemotherapy. Protect from light. 12/13/17   Leland Her, DO  nystatin-triamcinolone ointment (MYCOLOG) Apply 1 application topically 2 (two) times daily for 10 days. To affected areas 02/12/18 02/22/18  Vonte Rossin, DO  predniSONE (DELTASONE) 10 MG tablet Take 1 tablet (10 mg total) by mouth daily with  breakfast. 12/13/17   Leland Her, DO  traMADol Janean Sark) 50 MG tablet Take 2 tabs every 8 hours for a max of 6 tabs in 24 hours 08/13/17   Nestor Ramp, MD    Family History Family History  Problem Relation Age of Onset  . Heart attack Mother   . Diabetes Mother   . Hypercholesterolemia Mother   . Hypertension Mother   . Diabetes Sister   . Hypertension Brother     Social History Social History   Tobacco Use  . Smoking status: Former Smoker    Last attempt to quit: 12/27/2012    Years since quitting: 5.1  .  Smokeless tobacco: Never Used  Substance Use Topics  . Alcohol use: No  . Drug use: No     Allergies   Tylenol [acetaminophen]   Review of Systems Review of Systems  Constitutional: Negative for fatigue and fever.  HENT: Negative for facial swelling.   Respiratory: Negative for shortness of breath and wheezing.   Cardiovascular: Negative for chest pain.  Gastrointestinal: Negative for abdominal pain and diarrhea.  Musculoskeletal: Negative for back pain.  Skin: Positive for itching and rash.     Physical Exam Updated Vital Signs  ED Triage Vitals  Enc Vitals Group     BP 02/12/18 0841 (!) 138/91     Pulse Rate 02/12/18 0841 75     Resp 02/12/18 0841 17     Temp 02/12/18 0841 98.5 F (36.9 C)     Temp Source 02/12/18 0841 Oral     SpO2 02/12/18 0841 100 %     Weight 02/12/18 0841 250 lb (113.4 kg)     Height 02/12/18 0841 5\' 9"  (1.753 m)     Head Circumference --      Peak Flow --      Pain Score 02/12/18 0844 0     Pain Loc --      Pain Edu? --      Excl. in GC? --     Physical Exam  Constitutional: She appears well-developed and well-nourished. No distress.  HENT:  Head: Normocephalic and atraumatic.  Eyes: Conjunctivae are normal.  Neck: Neck supple.  Pulmonary/Chest: Effort normal. No respiratory distress.  Abdominal: Soft. There is no tenderness.  Musculoskeletal: She exhibits no edema.  Neurological: She is alert.  Skin: Skin is warm and dry. Rash noted.  Psychiatric: She has a normal mood and affect.  Nursing note and vitals reviewed.    ED Treatments / Results  Labs (all labs ordered are listed, but only abnormal results are displayed) Labs Reviewed - No data to display  EKG None  Radiology No results found.  Procedures Procedures (including critical care time)  Medications Ordered in ED Medications - No data to display   Initial Impression / Assessment and Plan / ED Course  I have reviewed the triage vital signs and the nursing  notes.  Pertinent labs & imaging results that were available during my care of the patient were reviewed by me and considered in my medical decision making (see chart for details).     Tanya Owens is a 57 year old female who presents to the ED with a rash.  Patient with normal vitals.  No fever.  Patient appears to have rash underneath her breasts and groin within the skin fold that is most likely consistent with a fungal infection.  No signs of cellulitis/abscess.  She has tried multiple over-the-counter medications without much relief.  Does not  appear to have use any antifungal or steroidal creams.  No involvement of mucosa.  No concern for Stevens-Johnson's or TEN.  Patient given Mycolog prescription and recommend close follow-up with primary care doctor.  Told to return to the ED if symptoms worsen.  Final Clinical Impressions(s) / ED Diagnoses   Final diagnoses:  Rash    ED Discharge Orders         Ordered    nystatin-triamcinolone ointment (MYCOLOG)  2 times daily,   Status:  Discontinued     02/12/18 0852    nystatin-triamcinolone ointment (MYCOLOG)  2 times daily     02/12/18 0853           Virgina Norfolk, DO 02/12/18 [phone]

## 2018-02-12 NOTE — ED Triage Notes (Signed)
Patient arrives with c/o rash under breast and in between legs. Patient states has tried neosporin, corn starch. Discoloration noted under breast and in between legs.

## 2018-02-21 ENCOUNTER — Ambulatory Visit: Payer: Medicaid Other | Attending: Physical Medicine & Rehabilitation

## 2018-02-21 DIAGNOSIS — M25572 Pain in left ankle and joints of left foot: Secondary | ICD-10-CM | POA: Diagnosis present

## 2018-02-21 DIAGNOSIS — M25561 Pain in right knee: Secondary | ICD-10-CM | POA: Insufficient documentation

## 2018-02-21 DIAGNOSIS — G8929 Other chronic pain: Secondary | ICD-10-CM | POA: Diagnosis present

## 2018-02-21 DIAGNOSIS — M25571 Pain in right ankle and joints of right foot: Secondary | ICD-10-CM | POA: Insufficient documentation

## 2018-02-21 NOTE — Therapy (Signed)
[facility] [address] [place], [place], [address] Phone: [phone]   Fax:  [phone]  Physical Therapy Treatment  Patient Details  Name: Tanya Owens MRN: [id number] Date of Birth: 1961/02/09 Referring Provider (PT): Claudette Laws, MD   Encounter Date: 02/21/2018  PT End of Session - 02/21/18 1349    Visit Number  5    Number of Visits  13    Date for PT Re-Evaluation  03/11/18    Authorization Type  MCD    Authorization Time Period  02/15/18 to 03/07/18    Authorization - Visit Number  1    Authorization - Number of Visits  6    PT Start Time  0142    PT Stop Time  0222    PT Time Calculation (min)  40 min    Activity Tolerance  Patient tolerated treatment well    Behavior During Therapy  [facility] for tasks assessed/performed       Past Medical History:  Diagnosis Date  . Arthritis 2000  . Diabetes mellitus without complication (HCC) 2005  . Hypercholesteremia 2005  . Hypertension 2005  . Sleep apnea 2010  . Sleep disorder     Past Surgical History:  Procedure Laterality Date  . CARDIAC CATHETERIZATION  10/18/2007   Normal coronaries, systemic hypertension    There were no vitals filed for this visit.  Subjective Assessment - 02/21/18 1350    Subjective  Hard to tell if anything bette as one area seem some better then another gets worse. Hand swollen and painful today.    Hands are a 9/10 today    Pain Score  3     Pain Location  Knee    Pain Orientation  Right;Left    Pain Descriptors / Indicators  Aching    Pain Type  Chronic pain    Pain Onset  More than a month ago    Pain Frequency  Constant    Aggravating Factors   pain worse at nigh and doing anything    Pain Relieving Factors  meds    Pain Score  8    Pain Location  Ankle   and feet    Pain Orientation  Right;Left   Lt 7/10   Pain Descriptors / Indicators  Aching;Sore;Throbbing    Pain Type  Chronic pain    Pain Onset  More than a month  ago    Pain Frequency  Constant    Aggravating Factors   any activity    Pain Relieving Factors  meds    Pain Score  --   no number ggiven   Pain Location  Shoulder    Pain Orientation  Right;Left    Pain Descriptors / Indicators  Aching;Tingling    Pain Type  Chronic pain                       [facility] Adult PT Treatment/Exercise - 02/21/18 0001      Exercises   Exercises  Knee/Hip      Knee/Hip Exercises: Aerobic   Nustep  L4 LE 5 min      Knee/Hip Exercises: Seated   Long Arc Quad  Right;Left;15 reps    Long Arc Quad Limitations  red band slow control desent    Hamstring Curl  Left;Both;15 reps    Hamstring Limitations  red band      Ankle Exercises: Seated   Other Seated Ankle Exercises  red band DF /  Ever/ PF x 15      Also red band rotation with both hips. X 15 . All issued for HEP        PT Education - 02/21/18 1429    Education Details  HEP with bands    Person(s) Educated  Patient    Methods  Explanation;Demonstration;Tactile cues;Verbal cues;Handout    Comprehension  Returned demonstration;Verbalized understanding       PT Short Term Goals - 02/07/18 1345      PT SHORT TERM GOAL #1   Title  she will be independnet with iniitial HEP    Baseline  reports she is consistent with her exercises    Time  2    Period  Weeks      PT SHORT TERM GOAL #2   Title  She will report pain decreased 10-20 % in calf RT/LT    Baseline  today 6/10 which is 10% drop compared revaulation being 7/10    Time  2    Period  Weeks    Status  Achieved        PT Long Term Goals - 02/07/18 1346      PT LONG TERM GOAL #1   Title  She will be independent with all HEP issued     Baseline  Indepent with current HEP progress as able    Time  6    Period  Weeks    Status  On-going    Target Date  02/28/18      PT LONG TERM GOAL #2   Title  She will report annkle pain decr 40% or more with walking    Baseline  pain at 6/10     Time  6    Period  Weeks     Status  On-going    Target Date  02/28/18      PT LONG TERM GOAL #3   Title  She will report knee pain as intermittant and decr 40% or more    Baseline  pain in the knee 6/10 but had to take medication this morning to calm down    Time  6    Period  Weeks    Status  On-going    Target Date  02/28/18      PT LONG TERM GOAL #4   Title  She will be able to walk 500 feet por more without increased knee or ankle pain    Baseline  able to walk 500 ft but pain goes up to a 10/10    Time  6    Period  Weeks    Status  On-going    Target Date  02/28/18            Plan - 02/21/18 1349    Clinical Impression Statement  She continues to report pain  at high levels and location varies.   She is able to do the exercises without any significant incr pain/  She wants to do treadmill but I don't feel there is any benefit for this.   Suggested ice baths for hands and return to MD  if hands don't improve.     PT Treatment/Interventions  Moist Heat;Therapeutic exercise;Patient/family education;Manual techniques    PT Next Visit Plan  REview HEp with bands for ankle and knees  Add closed Chain?     PT Home Exercise Plan  DF stretch , towel exer, Shoulder retraction and depression and trunk rotation    Consulted and Agree with Plan  of Care  Patient       Patient will benefit from skilled therapeutic intervention in order to improve the following deficits and impairments:  Pain, Postural dysfunction, Decreased strength, Decreased range of motion, Difficulty walking  Visit Diagnosis: Chronic pain of right knee  Pain in right ankle and joints of right foot  Pain in left ankle and joints of left foot     Problem List Patient Active Problem List   Diagnosis Date Noted  . Seronegative arthropathy of multiple sites 07/30/2017  . Encounter for medication management 04/19/2017  . Acromioclavicular joint arthritis 03/26/2017  . Frequent No-show for appointment 12/15/2016  . Cyclic citrullinated  peptide (CCP) antibody positive 10/28/2016  . Obesity (BMI 30-39.9) 11/28/2014  . Type 2 diabetes mellitus without complication (HCC) 11/28/2014  . HTN (hypertension) 06/21/2013  . Mixed hyperlipidemia 06/21/2013    Caprice Red  PT 02/21/2018, 2:30 PM  [facility] [address] [place], [place], [address] Phone: [phone]   Fax:  [phone]  Name: Tanya Owens MRN: [id number] Date of Birth: May 17, 1961

## 2018-02-21 NOTE — Patient Instructions (Signed)
REd bands issued foir 4 way ankle bilateral , quad and hamstring, hip rot and clam in sitting.   10-15 reps  1x/day

## 2018-02-22 ENCOUNTER — Ambulatory Visit: Payer: Medicaid Other

## 2018-02-22 DIAGNOSIS — M25561 Pain in right knee: Secondary | ICD-10-CM | POA: Diagnosis not present

## 2018-02-22 DIAGNOSIS — G8929 Other chronic pain: Secondary | ICD-10-CM

## 2018-02-22 DIAGNOSIS — M25571 Pain in right ankle and joints of right foot: Secondary | ICD-10-CM

## 2018-02-22 DIAGNOSIS — M25572 Pain in left ankle and joints of left foot: Secondary | ICD-10-CM

## 2018-02-22 NOTE — Patient Instructions (Addendum)
Clam    Lie on side, legs bent 90. Open top knee to ceiling, rotating leg outward. Touch toes to ankle of bottom leg. Close knees, rotating leg inward. Maintain hip position. Repeat __10-15__ times. Repeat on other side. Do ___1HIP: Abduction - Side-Lying    Lie on side, legs straight and in line with trunk. Squeeze glutes. Raise top leg up and slightly back. Point toes forward. _10-15__ reps per set, _1__ sets per day, _3-5__ days per week Bend bottom leg to stabilize pelvis.if needed.  Keep hip rolled forward  Copyright  VHI. All rights reserved.  _ sessions per day.  http://pm.exer.us/69   Copyright  VHI. All rights reserved.  Bridge    Lie back, legs bent. Inhale, pressing hips up. Keeping ribs in, lengthen lower back. Exhale, rolling down along spine from top. Repeat _10-15___ times. Do __1__ sessions per day.  http://pm.exer.us/55   Copyright  VHI. All rights reserved.

## 2018-02-22 NOTE — Therapy (Signed)
[facility] [address] [place], [place], [address] Phone: [phone]   Fax:  [phone]  Physical Therapy Treatment  Patient Details  Name: Tanya Owens MRN: [id number] Date of Birth: 04-08-1961 Referring Provider (PT): Claudette Laws, MD   Encounter Date: 02/22/2018  PT End of Session - 02/22/18 1500    Visit Number  6    Number of Visits  13    Date for PT Re-Evaluation  03/11/18    Authorization Type  MCD    Authorization Time Period  02/15/18 to 03/07/18    Authorization - Visit Number  2    Authorization - Number of Visits  6    PT Start Time  0300    PT Stop Time  0345    PT Time Calculation (min)  45 min    Activity Tolerance  Patient tolerated treatment well;Patient limited by pain    Behavior During Therapy  [facility] for tasks assessed/performed       Past Medical History:  Diagnosis Date  . Arthritis 2000  . Diabetes mellitus without complication (HCC) 2005  . Hypercholesteremia 2005  . Hypertension 2005  . Sleep apnea 2010  . Sleep disorder     Past Surgical History:  Procedure Laterality Date  . CARDIAC CATHETERIZATION  10/18/2007   Normal coronaries, systemic hypertension    There were no vitals filed for this visit.  Subjective Assessment - 02/22/18 1511    Subjective  Hands and neck/shoulders bad today , Can't do to much .  Pain levels same in feet and back . Hand s some worse.                        [facility] Adult PT Treatment/Exercise - 02/22/18 0001      Exercises   Exercises  Knee/Hip;Neck      Knee/Hip Exercises: Aerobic   Nustep  L4 LE 5 min      Knee/Hip Exercises: Seated   Long Arc Quad  Right;Left;15 reps    Long Arc Quad Limitations  red band slow control desent    Hamstring Curl  Left;Both;15 reps    Hamstring Limitations  red band      Knee/Hip Exercises: Supine   Bridges  15 reps      Knee/Hip Exercises: Sidelying   Hip ABduction  Right;Left    Hip  ABduction Limitations  12 reps    Clams  x 12 RT /LT and reverse clams x12      Shoulder Exercises: Seated   Other Seated Exercises  scapula retraction with depression with deep breathing multiple reps for HEP then neck  rotation RT/LT  .and side bend 10 reps  and trunk rotation x 5 RT /LT       Ankle Exercises: Seated   Other Seated Ankle Exercises  red band DF /Ever/ PF x 15              PT Education - 02/22/18 1529    Education Details  clams , abduction , bridge, 4-5 hours wiith heel lift in LT shoe then out for same period x 2 days and decide if hepful     Person(s) Educated  Patient    Methods  Explanation;Tactile cues;Verbal cues;Handout    Comprehension  Returned demonstration;Verbalized understanding       PT Short Term Goals - 02/07/18 1345      PT SHORT TERM GOAL #1   Title  she will be  independnet with iniitial HEP    Baseline  reports she is consistent with her exercises    Time  2    Period  Weeks      PT SHORT TERM GOAL #2   Title  She will report pain decreased 10-20 % in calf RT/LT    Baseline  today 6/10 which is 10% drop compared revaulation being 7/10    Time  2    Period  Weeks    Status  Achieved        PT Long Term Goals - 02/07/18 1346      PT LONG TERM GOAL #1   Title  She will be independent with all HEP issued     Baseline  Indepent with current HEP progress as able    Time  6    Period  Weeks    Status  On-going    Target Date  02/28/18      PT LONG TERM GOAL #2   Title  She will report annkle pain decr 40% or more with walking    Baseline  pain at 6/10     Time  6    Period  Weeks    Status  On-going    Target Date  02/28/18      PT LONG TERM GOAL #3   Title  She will report knee pain as intermittant and decr 40% or more    Baseline  pain in the knee 6/10 but had to take medication this morning to calm down    Time  6    Period  Weeks    Status  On-going    Target Date  02/28/18      PT LONG TERM GOAL #4   Title  She  will be able to walk 500 feet por more without increased knee or ankle pain    Baseline  able to walk 500 ft but pain goes up to a 10/10    Time  6    Period  Weeks    Status  On-going    Target Date  02/28/18            Plan - 02/22/18 1501    Clinical Impression Statement  She is able to do all exercies without complaint . Suggested she neede to stretch with more regularity in shoulders and neck and trunk.   Progressing with exercisse but not pain de cr.     PT Treatment/Interventions  Moist Heat;Therapeutic exercise;Patient/family education;Manual techniques    PT Next Visit Plan  REview HEp for hips  Add closed Chain?     PT Home Exercise Plan  DF stretch , towel exer, Shoulder retraction and depression and trunk rotation, band exercise for knees and ankles, bridge, side lye abduction and  clams.     Consulted and Agree with Plan of Care  Patient       Patient will benefit from skilled therapeutic intervention in order to improve the following deficits and impairments:  Pain, Postural dysfunction, Decreased strength, Decreased range of motion, Difficulty walking  Visit Diagnosis: Chronic pain of right knee  Pain in right ankle and joints of right foot  Pain in left ankle and joints of left foot     Problem List Patient Active Problem List   Diagnosis Date Noted  . Seronegative arthropathy of multiple sites 07/30/2017  . Encounter for medication management 04/19/2017  . Acromioclavicular joint arthritis 03/26/2017  . Frequent No-show for appointment 12/15/2016  .  Cyclic citrullinated peptide (CCP) antibody positive 10/28/2016  . Obesity (BMI 30-39.9) 11/28/2014  . Type 2 diabetes mellitus without complication (HCC) 11/28/2014  . HTN (hypertension) 06/21/2013  . Mixed hyperlipidemia 06/21/2013    Caprice Red PT 02/22/2018, 3:46 PM  [facility] [address] [place], [place], [address] Phone: [phone]    Fax:  [phone]  Name: Tanya Owens MRN: [id number] Date of Birth: Sep 19, 1960

## 2018-02-25 ENCOUNTER — Encounter: Payer: Self-pay | Admitting: Physical Medicine & Rehabilitation

## 2018-02-25 ENCOUNTER — Ambulatory Visit (HOSPITAL_BASED_OUTPATIENT_CLINIC_OR_DEPARTMENT_OTHER): Payer: Medicaid Other | Admitting: Physical Medicine & Rehabilitation

## 2018-02-25 ENCOUNTER — Encounter: Payer: Medicaid Other | Attending: Physical Medicine & Rehabilitation

## 2018-02-25 VITALS — BP 162/98 | HR 80 | Ht 69.0 in | Wt 250.8 lb

## 2018-02-25 DIAGNOSIS — I10 Essential (primary) hypertension: Secondary | ICD-10-CM | POA: Diagnosis not present

## 2018-02-25 DIAGNOSIS — E119 Type 2 diabetes mellitus without complications: Secondary | ICD-10-CM | POA: Insufficient documentation

## 2018-02-25 DIAGNOSIS — M25562 Pain in left knee: Secondary | ICD-10-CM | POA: Diagnosis not present

## 2018-02-25 DIAGNOSIS — M79641 Pain in right hand: Secondary | ICD-10-CM | POA: Insufficient documentation

## 2018-02-25 DIAGNOSIS — Z5181 Encounter for therapeutic drug level monitoring: Secondary | ICD-10-CM | POA: Insufficient documentation

## 2018-02-25 DIAGNOSIS — M25572 Pain in left ankle and joints of left foot: Secondary | ICD-10-CM | POA: Diagnosis not present

## 2018-02-25 DIAGNOSIS — E7800 Pure hypercholesterolemia, unspecified: Secondary | ICD-10-CM | POA: Diagnosis not present

## 2018-02-25 DIAGNOSIS — M19011 Primary osteoarthritis, right shoulder: Secondary | ICD-10-CM | POA: Insufficient documentation

## 2018-02-25 DIAGNOSIS — M25571 Pain in right ankle and joints of right foot: Secondary | ICD-10-CM | POA: Diagnosis not present

## 2018-02-25 DIAGNOSIS — Z87891 Personal history of nicotine dependence: Secondary | ICD-10-CM | POA: Diagnosis not present

## 2018-02-25 DIAGNOSIS — Z79891 Long term (current) use of opiate analgesic: Secondary | ICD-10-CM | POA: Diagnosis not present

## 2018-02-25 DIAGNOSIS — G4730 Sleep apnea, unspecified: Secondary | ICD-10-CM | POA: Insufficient documentation

## 2018-02-25 DIAGNOSIS — M1389 Other specified arthritis, multiple sites: Secondary | ICD-10-CM

## 2018-02-25 DIAGNOSIS — M79642 Pain in left hand: Secondary | ICD-10-CM | POA: Diagnosis not present

## 2018-02-25 DIAGNOSIS — M25561 Pain in right knee: Secondary | ICD-10-CM | POA: Insufficient documentation

## 2018-02-25 DIAGNOSIS — M0609 Rheumatoid arthritis without rheumatoid factor, multiple sites: Secondary | ICD-10-CM

## 2018-02-25 MED ORDER — OXYCODONE HCL 5 MG PO TABS: 5.0000 mg | ORAL_TABLET | Freq: Every day | ORAL | 0 refills | Status: DC

## 2018-02-25 NOTE — Progress Notes (Signed)
Subjective:    Patient ID: Tanya Owens, female    DOB: 19-Mar-1961, 57 y.o.   MRN: [id number] 57 year old female with 6 to 7-year history of diabetes as well as hypertension with a greater than 2-year history of migratory joint pains that was referred by sports medicine for physical medicine and rehabilitation evaluation.  The patient is independent with all self-care and mobility.  She does not require an assistive device for ambulation.  Her primary complaints include right greater than left hand pain mainly in the knuckles of the index and middle finger.  She has some burning pain in both wrists but not in her fingers.  She has inability to raise her right shoulder beyond a few degrees and her left shoulder only has partial range of motion.  In addition she complains of bilateral knee pain and bilateral ankle pain more so than foot pain.  She has been treated by her primary care physician for polyarthralgia initially with ibuprofen.  The patient was referred from primary care to sports medicine.  Underwent right shoulder injection as well as knee injection with corticosteroid.  She indicated a short-term relief with these injections.  The patient had x-rays of both ankles and feet showing some mild midfoot arthritis on the left and mild degenerative changes right ankle.  Has had shoulder x-rays showing acromioclavicular joint OA as well as calcific tendinitis of the right shoulder.  Blood work normal CRP minimally increased sed rate of 31, rheumatoid factor negative ANA negative.  Referral to rheumatology.  Trialed on methotrexate as well as low-dose prednisone without much relief.  Analgesic changed to tramadol initially on 1 tablet 3 times a day patient increased it own and took 3 at a time.  Subsequently changed to 2 tablets 3 times per day ORT- 55 HPI 57 year old female with history of polyarthralgia secondary to seronegative arthritis.  She continues to follow-up with rheumatology and is on  Enbrel Seen by PT who gave pt back , hip, knee and ankle exercises  Worse pain in shoulders,hands, not addressed by PT (per pt)  Tried ibuprofen but only works if taking ~8tabs Tylenol causes stomach cramping  Patient is independent with all her ADLs.  Ambulates without assistive device.  She does have hand pain with certain grasping motions particularly on the right side  Reviewed PT exercises including internal/external rotation stretching for the hip, hip abduction exercises as well as gluteus bridges Pain Inventory Average Pain 9 Pain Right Now 9 My pain is constant, dull and aching  In the last 24 hours, has pain interfered with the following? General activity 0 Relation with others 0 Enjoyment of life 0 What TIME of day is your pain at its worst? night Sleep (in general) Poor  Pain is worse with: some activites and pressure from sleeping Pain improves with: rest, heat/ice and medication Relief from Meds: 4  Mobility walk without assistance ability to climb steps?  yes do you drive?  yes  Function disabled: date disabled 2020  Neuro/Psych numbness tingling depression  Prior Studies Any changes since last visit?  no  Physicians involved in your care Any changes since last visit?  no   Family History  Problem Relation Age of Onset  . Heart attack Mother   . Diabetes Mother   . Hypercholesterolemia Mother   . Hypertension Mother   . Diabetes Sister   . Hypertension Brother    Social History   Socioeconomic History  . Marital status: Single    Spouse  name: Not on file  . Number of children: Not on file  . Years of education: Not on file  . Highest education level: Not on file  Occupational History  . Not on file  Social Needs  . Financial resource strain: Not on file  . Food insecurity:    Worry: Not on file    Inability: Not on file  . Transportation needs:    Medical: Not on file    Non-medical: Not on file  Tobacco Use  . Smoking status:  Former Smoker    Last attempt to quit: 12/27/2012    Years since quitting: 5.1  . Smokeless tobacco: Never Used  Substance and Sexual Activity  . Alcohol use: No  . Drug use: No  . Sexual activity: Yes    Birth control/protection: Post-menopausal  Lifestyle  . Physical activity:    Days per week: Not on file    Minutes per session: Not on file  . Stress: Not on file  Relationships  . Social connections:    Talks on phone: Not on file    Gets together: Not on file    Attends religious service: Not on file    Active member of club or organization: Not on file    Attends meetings of clubs or organizations: Not on file    Relationship status: Not on file  Other Topics Concern  . Not on file  Social History Narrative  . Not on file   Past Surgical History:  Procedure Laterality Date  . CARDIAC CATHETERIZATION  10/18/2007   Normal coronaries, systemic hypertension   Past Medical History:  Diagnosis Date  . Arthritis 2000  . Diabetes mellitus without complication (HCC) 2005  . Hypercholesteremia 2005  . Hypertension 2005  . Sleep apnea 2010  . Sleep disorder    There were no vitals taken for this visit.  Opioid Risk Score:   Fall Risk Score:  `1  Depression screen PHQ 2/9  Depression screen [facility] 2/9 12/13/2017 08/16/2017 07/02/2017 06/02/2017 04/16/2017 02/01/2017 01/04/2017  Decreased Interest 0 0 0 0 0 0 0  Down, Depressed, Hopeless 0 0 0 0 0 0 0  PHQ - 2 Score 0 0 0 0 0 0 0     Review of Systems  Constitutional: Negative.   HENT: Negative.   Eyes: Negative.   Respiratory: Negative.   Cardiovascular: Negative.   Gastrointestinal: Negative.   Endocrine: Negative.   Genitourinary: Negative.   Musculoskeletal: Positive for arthralgias and myalgias.  Skin: Negative.   Allergic/Immunologic: Negative.   Neurological: Positive for numbness.  Hematological: Negative.   Psychiatric/Behavioral: Positive for dysphoric mood.  All other systems reviewed and are  negative.      Objective:   Physical Exam  Patient has decreased flexion at the right second and third MCP and PIP joints.  She has difficulty with grasp and uses lateral pinch instead. She has no evidence of finger or wrist deformities bilaterally. She has reduced shoulder external rotation bilaterally but intact internal rotation bilaterally abduction is up to 90 degrees limited by pain. Patient has normal neck range of motion no pain with neck range of motion.  Lumbar spine 50% range flexion extension lateral bending and rotation. Negative straight leg raising Motor strength is 5/5 bilateral deltoid, bicep, tricep, grip, hip flexor, knee extensor, ankle dorsiflex.      Assessment & Plan:  1.  Seronegative polyarthritis, inflammatory, on Enbrel rheumatology.  We discussed that is to help prevent disease progression and joint  deformities.  Patient will need to keep up with her physical therapy exercise that she has 4 more visits.  After which she will start OT for joint protection techniques as well as assistive devices  We discussed her pain medication.  She has mainly pain at night.  We discussed that the buprenorphine patch is more for 24 hour/day pain. Will prescribe oxycodone 5 mill grams nightly.  Urine drug screen was reviewed and appropriate, PMP aware website reviewed, follow-up 1 month with nurse practitioner We discussed that she will need monthly visits. Opioid risk is 0

## 2018-02-28 ENCOUNTER — Ambulatory Visit: Payer: Medicaid Other

## 2018-02-28 ENCOUNTER — Telehealth: Payer: Self-pay | Admitting: *Deleted

## 2018-02-28 NOTE — Telephone Encounter (Signed)
Tanya Tanya Owens called to report she could not get her medication (oxycodone) and the pharmacy did not have Rx.  I called a verfied it was actually because of needing a PA with [employer].  Initiated PA for oxycodone 5 mg #30, Confirmation #: [id number] W Prior Approval #: [id number] Status: APPROVED Tanya Owens notified.

## 2018-03-02 ENCOUNTER — Ambulatory Visit: Payer: Medicaid Other | Admitting: Physical Therapy

## 2018-03-02 ENCOUNTER — Encounter: Payer: Self-pay | Admitting: Physical Therapy

## 2018-03-02 DIAGNOSIS — M25572 Pain in left ankle and joints of left foot: Secondary | ICD-10-CM

## 2018-03-02 DIAGNOSIS — G8929 Other chronic pain: Secondary | ICD-10-CM

## 2018-03-02 DIAGNOSIS — M25561 Pain in right knee: Principal | ICD-10-CM

## 2018-03-02 DIAGNOSIS — M25571 Pain in right ankle and joints of right foot: Secondary | ICD-10-CM

## 2018-03-02 NOTE — Therapy (Signed)
[facility] [address] [place], [place], [address] Phone: [phone]   Fax:  [phone]  Physical Therapy Treatment  Patient Details  Name: Tanya Owens MRN: [id number] Date of Birth: June 27, 1960 Referring Provider (PT): Claudette Laws, MD   Encounter Date: 03/02/2018  PT End of Session - 03/02/18 1415    Visit Number  7    Number of Visits  13    Date for PT Re-Evaluation  03/11/18    Authorization Type  MCD    Authorization Time Period  02/15/18 to 03/07/18    Authorization - Visit Number  3    Authorization - Number of Visits  6    PT Start Time  1415    PT Stop Time  1455    PT Time Calculation (min)  40 min    Activity Tolerance  Patient tolerated treatment well    Behavior During Therapy  [facility] for tasks assessed/performed       Past Medical History:  Diagnosis Date  . Arthritis 2000  . Diabetes mellitus without complication (HCC) 2005  . Hypercholesteremia 2005  . Hypertension 2005  . Sleep apnea 2010  . Sleep disorder     Past Surgical History:  Procedure Laterality Date  . CARDIAC CATHETERIZATION  10/18/2007   Normal coronaries, systemic hypertension    There were no vitals filed for this visit.  Subjective Assessment - 03/02/18 1415    Subjective  "My hands are still hurting and swollen. My ankle is fine, my Md gave me a new medication to take at night to help with sleeping, the bottom of my feet are hurting which i think is from my DM, R knee hurts the L isn't bad today"    Currently in Pain?  Yes    Pain Score  6     Pain Location  Knee    Pain Orientation  Left;Right   R 6/10   Pain Type  Chronic pain    Pain Onset  More than a month ago    Pain Frequency  Intermittent    Aggravating Factors   pain is worst at night, unsure what causes.     Pain Score  7    Pain Location  Ankle    Pain Orientation  Right;Left    Pain Descriptors / Indicators  Aching;Sore;Throbbing    Pain Type  Chronic  pain    Pain Onset  More than a month ago    Pain Frequency  Intermittent    Aggravating Factors   any movement.     Pain Score  9    Pain Location  Shoulder    Pain Orientation  Right;Left    Pain Descriptors / Indicators  Aching;Sore    Pain Type  Chronic pain    Pain Onset  More than a month ago    Pain Frequency  Intermittent    Aggravating Factors   worse at night unsure what causes it    Pain Relieving Factors  meds, heat                       [facility] Adult PT Treatment/Exercise - 03/02/18 1423      Knee/Hip Exercises: Aerobic   Nustep  L5 x 5 min LE only      Knee/Hip Exercises: Machines for Strengthening   Cybex Leg Press  2 x 10 with 20# bil LE       Knee/Hip Exercises: Standing  Hip Abduction  2 sets;10 reps;Knee straight;Both    Hip Extension  2 sets;10 reps;Knee straight;Stengthening;Both      Ankle Exercises: Seated   Towel Inversion/Eversion  4 reps   bil inversion     Ankle Exercises: Standing   Heel Raises  Both   2x 10 with bil HHA from counter            PT Education - 03/02/18 1453    Education Details  updated HEP for standing hip strengthening and heel raises, benefits of focusing on movement and function of pain and what she is unable to do.     Person(s) Educated  Patient    Methods  Explanation;Verbal cues;Handout;Demonstration    Comprehension  Verbalized understanding;Verbal cues required;Returned demonstration       PT Short Term Goals - 02/07/18 1345      PT SHORT TERM GOAL #1   Title  she will be independnet with iniitial HEP    Baseline  reports she is consistent with her exercises    Time  2    Period  Weeks      PT SHORT TERM GOAL #2   Title  She will report pain decreased 10-20 % in calf RT/LT    Baseline  today 6/10 which is 10% drop compared revaulation being 7/10    Time  2    Period  Weeks    Status  Achieved        PT Long Term Goals - 02/07/18 1346      PT LONG TERM GOAL #1   Title  She will  be independent with all HEP issued     Baseline  Indepent with current HEP progress as able    Time  6    Period  Weeks    Status  On-going    Target Date  02/28/18      PT LONG TERM GOAL #2   Title  She will report annkle pain decr 40% or more with walking    Baseline  pain at 6/10     Time  6    Period  Weeks    Status  On-going    Target Date  02/28/18      PT LONG TERM GOAL #3   Title  She will report knee pain as intermittant and decr 40% or more    Baseline  pain in the knee 6/10 but had to take medication this morning to calm down    Time  6    Period  Weeks    Status  On-going    Target Date  02/28/18      PT LONG TERM GOAL #4   Title  She will be able to walk 500 feet por more without increased knee or ankle pain    Baseline  able to walk 500 ft but pain goes up to a 10/10    Time  6    Period  Weeks    Status  On-going    Target Date  02/28/18            Plan - 03/02/18 1454    Clinical Impression Statement  pt reports continued pain in the hands with some improvement in the knees and ankle/ foot pain which she attributes to potentially be from diabetes. focused on standing exercise and benefit of positive thought and focusing on movement versus pain. end of session she reported feeling alittle better.     PT Treatment/Interventions  Moist  Heat;Therapeutic exercise;Patient/family education;Manual techniques    PT Next Visit Plan  REview HEp for hips, strengthening standing, ankle strengtheing, and arch strengthening/ building exercises.     PT Home Exercise Plan  DF stretch , towel exer, Shoulder retraction and depression and trunk rotation, band exercise for knees and ankles, bridge, side lye abduction and  clams. standing hip abduction/ extension, heel raises.    Consulted and Agree with Plan of Care  Patient       Patient will benefit from skilled therapeutic intervention in order to improve the following deficits and impairments:  Pain, Postural  dysfunction, Decreased strength, Decreased range of motion, Difficulty walking  Visit Diagnosis: Chronic pain of right knee  Pain in right ankle and joints of right foot  Pain in left ankle and joints of left foot     Problem List Patient Active Problem List   Diagnosis Date Noted  . Seronegative arthropathy of multiple sites 07/30/2017  . Encounter for medication management 04/19/2017  . Acromioclavicular joint arthritis 03/26/2017  . Frequent No-show for appointment 12/15/2016  . Cyclic citrullinated peptide (CCP) antibody positive 10/28/2016  . Obesity (BMI 30-39.9) 11/28/2014  . Type 2 diabetes mellitus without complication (HCC) 11/28/2014  . HTN (hypertension) 06/21/2013  . Mixed hyperlipidemia 06/21/2013   Lulu Riding PT, DPT, LAT, ATC  03/02/18  2:58 PM      [facility] [address] [place], [place], [address] Phone: [phone]   Fax:  [phone]  Name: JUANELLE TRUEHEART MRN: [id number] Date of Birth: 10-09-1960

## 2018-03-04 ENCOUNTER — Encounter: Payer: Self-pay | Admitting: Physical Therapy

## 2018-03-04 ENCOUNTER — Ambulatory Visit: Payer: Medicaid Other | Admitting: Physical Therapy

## 2018-03-04 DIAGNOSIS — M25571 Pain in right ankle and joints of right foot: Secondary | ICD-10-CM

## 2018-03-04 DIAGNOSIS — M25572 Pain in left ankle and joints of left foot: Secondary | ICD-10-CM

## 2018-03-04 DIAGNOSIS — G8929 Other chronic pain: Secondary | ICD-10-CM

## 2018-03-04 DIAGNOSIS — M25561 Pain in right knee: Secondary | ICD-10-CM

## 2018-03-04 NOTE — Therapy (Signed)
[place], [place], [address] Phone: [phone]   Fax:  [phone]  Physical Therapy Treatment  Patient Details  Name: Tanya Owens MRN: [id number] Date of Birth: 1961-01-21 Referring Provider (PT): Alysia Penna, MD   Encounter Date: 03/04/2018  PT End of Session - 03/04/18 1149    Visit Number  8    Number of Visits  13    Date for PT Re-Evaluation  03/11/18    Authorization Type  MCD    Authorization Time Period  02/15/18 to 03/07/18    Authorization - Visit Number  4    Authorization - Number of Visits  6    PT Start Time  3382    PT Stop Time  1229    PT Time Calculation (min)  42 min       Past Medical History:  Diagnosis Date  . Arthritis 2000  . Diabetes mellitus without complication ([place]) 5053  . Hypercholesteremia 2005  . Hypertension 2005  . Sleep apnea 2010  . Sleep disorder     Past Surgical History:  Procedure Laterality Date  . CARDIAC CATHETERIZATION  10/18/2007   Normal coronaries, systemic hypertension    There were no vitals filed for this visit.  Subjective Assessment - 03/04/18 1147    Subjective  My body is hurting today. Some days are worse.     Currently in Pain?  Yes    Pain Score  8     Pain Location  Knee    Pain Orientation  Right    Pain Descriptors / Indicators  Aching    Pain Score  8    Pain Location  Shoulder    Pain Orientation  Right;Left    Pain Descriptors / Indicators  Aching;Sore                       [facility] Adult PT Treatment/Exercise - 03/04/18 0001      Knee/Hip Exercises: Aerobic   Nustep  L5 x 7 min LE only       Knee/Hip Exercises: Standing   Hip Abduction  2 sets;10 reps;Knee straight;Both    Hip Extension  2 sets;10 reps;Knee straight;Stengthening;Both      Knee/Hip Exercises: Seated   Long Arc Quad  Right;Left;15 reps    Long Arc Quad Limitations  green     Hamstring Curl  Left;Both;15 reps    Hamstring  Limitations  green       Knee/Hip Exercises: Supine   Bridges  15 reps   with ball squeeze, supine green band clams      Knee/Hip Exercises: Sidelying   Hip ABduction  Right;Left    Hip ABduction Limitations  too fatigue    cues for alignment     Ankle Exercises: Seated   Towel Crunch  5 reps    Towel Inversion/Eversion  5 reps   focusing on knee alignment, multiple reps   Other Seated Ankle Exercises  Arch lifts      Ankle Exercises: Standing   Heel Raises  Both   2x 10 with bil HHA from counter     Ankle Exercises: Stretches   Gastroc Stretch Limitations  10 sec x 3 bilateral at counter-light touch of hands due to wrist pain.                PT Short Term Goals - 02/07/18 1345      PT SHORT TERM GOAL #1  Title  she will be independnet with iniitial HEP    Baseline  reports she is consistent with her exercises    Time  2    Period  Weeks      PT SHORT TERM GOAL #2   Title  She will report pain decreased 10-20 % in calf RT/LT    Baseline  today 6/10 which is 10% drop compared revaulation being 7/10    Time  2    Period  Weeks    Status  Achieved        PT Long Term Goals - 03/04/18 1157      PT LONG TERM GOAL #1   Title  She will be independent with all HEP issued     Baseline  Indepent with current HEP progress as able    Time  6    Period  Weeks    Status  On-going      PT LONG TERM GOAL #2   Title  She will report annkle pain decr 40% or more with walking    Baseline  pain at 8/10 , pain increases in calves with walking    Time  6    Period  Weeks    Status  On-going      PT LONG TERM GOAL #3   Title  She will report knee pain as intermittant and decr 40% or more    Baseline  pain in the knee 8/10 today    Time  6    Period  Weeks    Status  On-going      PT LONG TERM GOAL #4   Title  She will be able to walk 500 feet por more without increased knee or ankle pain    Baseline  can walk 2 [place] blocks and then walk back. Has increased calf  pain with this     Time  6    Period  Weeks    Status  Partially Met            Plan - 03/04/18 1159    Clinical Impression Statement  Pt reports she is walking however does not think it is helping her. She reports she can walk 2 [place] blocks without limitation however she has an increase in calf pain. Countinued with closed chain strengthening and calf stretching. Began instrinsic foot strengthening. No c./o pain except with toe curls.     PT Next Visit Plan  [facility] HEp for hips, strengthening standing, ankle strengtheing, and arch strengthening/ building exercises.     PT Home Exercise Plan  DF stretch , towel exer, Shoulder retraction and depression and trunk rotation, band exercise for knees and ankles, bridge, side lye abduction and  clams. standing hip abduction/ extension, heel raises.    Consulted and Agree with Plan of Care  Patient       Patient will benefit from skilled therapeutic intervention in order to improve the following deficits and impairments:  Pain, Postural dysfunction, Decreased strength, Decreased range of motion, Difficulty walking  Visit Diagnosis: Pain in right ankle and joints of right foot  Pain in left ankle and joints of left foot  Chronic pain of right knee     Problem List Patient Active Problem List   Diagnosis Date Noted  . Seronegative arthropathy of multiple sites 07/30/2017  . Encounter for medication management 04/19/2017  . Acromioclavicular joint arthritis 03/26/2017  . Frequent No-show for appointment 12/15/2016  . Cyclic citrullinated peptide (CCP) antibody positive  10/28/2016  . Obesity (BMI 30-39.9) 11/28/2014  . Type 2 diabetes mellitus without complication ([place]) 52/59/1028  . HTN (hypertension) 06/21/2013  . Mixed hyperlipidemia 06/21/2013    Dorene Ar, PTA 03/04/2018, 12:37 PM  [facility] [address] [place], [place], [address] Phone: [phone]   Fax:   [phone]  Name: ALA KRATZ MRN: [id number] Date of Birth: [birth year]

## 2018-03-07 ENCOUNTER — Ambulatory Visit: Payer: Medicaid Other

## 2018-03-07 DIAGNOSIS — G8929 Other chronic pain: Secondary | ICD-10-CM

## 2018-03-07 DIAGNOSIS — M25571 Pain in right ankle and joints of right foot: Secondary | ICD-10-CM

## 2018-03-07 DIAGNOSIS — M25561 Pain in right knee: Secondary | ICD-10-CM | POA: Diagnosis not present

## 2018-03-07 DIAGNOSIS — M25572 Pain in left ankle and joints of left foot: Secondary | ICD-10-CM

## 2018-03-07 LAB — HM DIABETES EYE EXAM

## 2018-03-07 NOTE — Therapy (Addendum)
[place], [place], [address] Phone: [phone]   Fax:  [phone]  Physical Therapy Treatment/Discharge  Patient Details  Name: Tanya Owens MRN: [id number] Date of Birth: [birth year] Referring Provider (PT): Alysia Penna, MD   Encounter Date: 03/07/2018  PT End of Session - 03/07/18 0922    Visit Number  9    Number of Visits  13    Date for PT Re-Evaluation  03/11/18    Authorization Type  MCD    Authorization - Visit Number  5    Authorization - Number of Visits  6    PT Start Time  0850    PT Stop Time  0930    PT Time Calculation (min)  40 min    Activity Tolerance  Patient tolerated treatment well    Behavior During Therapy  [facility] for tasks assessed/performed       Past Medical History:  Diagnosis Date  . Arthritis 2000  . Diabetes mellitus without complication ([place]) 4481  . Hypercholesteremia 2005  . Hypertension 2005  . Sleep apnea 2010  . Sleep disorder     Past Surgical History:  Procedure Laterality Date  . CARDIAC CATHETERIZATION  10/18/2007   Normal coronaries, systemic hypertension    There were no vitals filed for this visit.  Subjective Assessment - 03/07/18 0856    Subjective  My ankle gave out yesterday.  Legs are better overall with decr calf pain.     Pain Score  8     Pain Location  Knee    Pain Orientation  Right   No Lt knee pain   Pain Descriptors / Indicators  Aching    Pain Onset  More than a month ago    Pain Frequency  Intermittent   RT knee   Aggravating Factors   worse night.     Pain Relieving Factors  meds    Pain Score  7    Pain Location  Ankle    Pain Orientation  Right;Left    Pain Descriptors / Indicators  Aching;Sore;Throbbing    Pain Type  Chronic pain    Pain Onset  More than a month ago    Pain Frequency  Intermittent    Pain Score  8    Pain Location  Shoulder    Pain Orientation  Right;Left    Pain Descriptors / Indicators   Aching    Pain Type  Chronic pain    Pain Onset  More than a month ago    Pain Frequency  Intermittent    Aggravating Factors   worse at night/.                        [facility] Adult PT Treatment/Exercise - 03/07/18 0001      Knee/Hip Exercises: Aerobic   Nustep  L5 x  min LE only       Knee/Hip Exercises: Standing   Hip Abduction  Right;Left;15 reps    Hip Extension  Right;Left;15 reps      Knee/Hip Exercises: Seated   Long Arc Quad  Right;Left;15 reps    Long Arc Quad Limitations  4#, 5 sec hold      Knee/Hip Exercises: Supine   Bridges  10 reps   then 10 with ball squeeze and 10 with blue band     Knee/Hip Exercises: Sidelying   Hip ABduction  Right;Left;10 reps    Hip ABduction Limitations  cued for hip alighnment      Knee/Hip Exercises: Prone   Hamstring Curl  15 reps    Hamstring Curl Limitations  RT/LT 4 #    Straight Leg Raises  Right;Left;10 reps      Ankle Exercises: Standing   Heel Raises  Both;15 reps               PT Short Term Goals - 03/07/18 0900      PT SHORT TERM GOAL #1   Title  she will be independnet with iniitial HEP    Baseline  reports she is consistent with her exercises    Status  Achieved      PT SHORT TERM GOAL #2   Title  She will report pain decreased 10-20 % in calf RT/LT    Status  Achieved        PT Long Term Goals - 03/07/18 0900      PT LONG TERM GOAL #1   Title  She will be independent with all HEP issued     Baseline  Indepent with current HEP progress as able    Status  Achieved      PT LONG TERM GOAL #2   Title  She will report annkle pain decr 40% or more with walking    Baseline  pain at 8/10 , pain increases in calves with walking    Status  Not Met      PT LONG TERM GOAL #3   Title  She will report knee pain as intermittant and decr 40% or more    Baseline  pain in the knee 8/10 today    Status  Not Met      PT LONG TERM GOAL #4   Title  She will be able to walk 500 feet por more  without increased knee or ankle pain    Baseline  can walk 2 [place] blocks and then walk back. Has increased calf pain with this     Status  Partially Met            Plan - 03/07/18 0929    Clinical Impression Statement  He authorization is up today and she was agreeable to discharge with HEP.   She does appear she is doing her HEp for LE and she is to see Ot for Upper extrenmity pain.    She is essentially the same but is now doing a good HEP.     PT Treatment/Interventions  Moist Heat;Therapeutic exercise;Patient/family education;Manual techniques    PT Next Visit Plan  Discharge with HEP    PT Home Exercise Plan  DF stretch , towel exer, Shoulder retraction and depression and trunk rotation, band exercise for knees and ankles, bridge, side lye abduction and  clams. standing hip abduction/ extension, heel raises.    Consulted and Agree with Plan of Care  Patient       Patient will benefit from skilled therapeutic intervention in order to improve the following deficits and impairments:  Pain, Postural dysfunction, Decreased strength, Decreased range of motion, Difficulty walking  Visit Diagnosis: Pain in right ankle and joints of right foot  Pain in left ankle and joints of left foot  Chronic pain of right knee     Problem List Patient Active Problem List   Diagnosis Date Noted  . Seronegative arthropathy of multiple sites 07/30/2017  . Encounter for medication management 04/19/2017  . Acromioclavicular joint arthritis 03/26/2017  . Frequent No-show for appointment 12/15/2016  .  Cyclic citrullinated peptide (CCP) antibody positive 10/28/2016  . Obesity (BMI 30-39.9) 11/28/2014  . Type 2 diabetes mellitus without complication ([place]) 23/36/1224  . HTN (hypertension) 06/21/2013  . Mixed hyperlipidemia 06/21/2013    Darrel Hoover  PT 03/07/2018, 9:31 AM  [facility] [address] [place], [place], [address] Phone:  [phone]   Fax:  [phone]  Name: Tanya Owens MRN: [id number] Date of Birth: 1960-12-15  PHYSICAL THERAPY DISCHARGE SUMMARY  Visits from Start of Care: 9 Current functional level related to goals / functional outcomes: See above   Remaining deficits: See above   Education / Equipment: HEP Plan: Patient agrees to discharge.  Patient goals were partially met. Patient is being discharged due to meeting the stated rehab goals.  ?????

## 2018-03-10 ENCOUNTER — Ambulatory Visit: Payer: Medicaid Other

## 2018-03-10 ENCOUNTER — Encounter: Payer: Self-pay | Admitting: Family Medicine

## 2018-03-10 ENCOUNTER — Other Ambulatory Visit: Payer: Self-pay

## 2018-03-10 ENCOUNTER — Ambulatory Visit (INDEPENDENT_AMBULATORY_CARE_PROVIDER_SITE_OTHER): Payer: Medicaid Other | Admitting: Family Medicine

## 2018-03-10 VITALS — BP 139/80 | HR 73 | Temp 98.1°F | Wt 248.0 lb

## 2018-03-10 DIAGNOSIS — M1389 Other specified arthritis, multiple sites: Secondary | ICD-10-CM

## 2018-03-10 DIAGNOSIS — I10 Essential (primary) hypertension: Secondary | ICD-10-CM | POA: Diagnosis not present

## 2018-03-10 DIAGNOSIS — E782 Mixed hyperlipidemia: Secondary | ICD-10-CM | POA: Diagnosis not present

## 2018-03-10 DIAGNOSIS — L304 Erythema intertrigo: Secondary | ICD-10-CM | POA: Diagnosis not present

## 2018-03-10 DIAGNOSIS — M0609 Rheumatoid arthritis without rheumatoid factor, multiple sites: Secondary | ICD-10-CM

## 2018-03-10 DIAGNOSIS — Z1239 Encounter for other screening for malignant neoplasm of breast: Secondary | ICD-10-CM

## 2018-03-10 DIAGNOSIS — E119 Type 2 diabetes mellitus without complications: Secondary | ICD-10-CM | POA: Diagnosis not present

## 2018-03-10 LAB — POCT GLYCOSYLATED HEMOGLOBIN (HGB A1C): HbA1c, POC (controlled diabetic range): 6.6 % (ref 0.0–7.0)

## 2018-03-10 MED ORDER — NYSTATIN 100000 UNIT/GM EX POWD: Freq: Four times a day (QID) | CUTANEOUS | 0 refills | Status: DC

## 2018-03-10 NOTE — Assessment & Plan Note (Signed)
Controlled and stable. Doing well on metformin 1000mg  BID. Continue current management. Counseled on PSV23, patient will consider and probably get at future visit

## 2018-03-10 NOTE — Assessment & Plan Note (Signed)
Continue atorvastatin 40mg qd 

## 2018-03-10 NOTE — Assessment & Plan Note (Signed)
Controlled and at goal.  On norvasc 10mg  qd, HCTZ 25mg  qd, lisinopril 40mg  qd. Continue current management.

## 2018-03-10 NOTE — Patient Instructions (Signed)
Your diabetes and blood pressure look good today. No changes to medications.  Please check-out at the front desk before leaving the clinic. Make an appointment in  6 months.  We are checking some labs today. If results require attention, either myself or my nurse will get in touch with you. If everything is normal, you will get a letter in the mail or a message in My Chart. Please give [place] a call if you do not hear from [place] after 2 weeks.    Feel free to call with any questions or concerns at any time, at [phone].   Take care,  Dr. Leland Her, DO [facility]

## 2018-03-10 NOTE — Progress Notes (Signed)
    Subjective:  Tanya Owens is a 57 y.o. female who presents to the [facility] today for diabetes follow up  HPI:   Diabetes States has been doing well  Medications: taking metformin 1000mg  BID, compliant all the time and tolearting well Has been following a diabetic diet.  Hypoglycemic symptoms- no  Polyuria- no New Visual problems- no States had recent diabetes eye exam this year that did not show retinopathy     Hypertension  Medications: norvasc 10mg  qd, HCTZ 25mg  qd, lisinopril 40mg  qd Compliance- good Lightheadedness- no   Edema- no  Chest pain- no      Dyspnea- no   Hyperlipidemia Disease Monitoring  See symptoms for Hypertension  Medication: atorvastatin 40mg  qd Compliance- good RUQ pain- no   Muscle aches- yes but states this is from her arthritis. She wants to go back to sports medicine for injections of her joints and is going back to rheumatology soon as she is out of her methotrexate.      ROS: Per HPI  Social Hx: She reports that she quit smoking about 5 years ago. She has never used smokeless tobacco. She reports that she does not drink alcohol or use drugs.   Objective:  Physical Exam: BP 139/80   Pulse 73   Temp 98.1 F (36.7 C) (Oral)   Wt 248 lb (112.5 kg)   SpO2 99%   BMI 36.62 kg/m   Gen: NAD, resting comfortably CV: RRR with no murmurs appreciated Pulm: NWOB, CTAB with no crackles, wheezes, or rhonchi GI: Normal bowel sounds present. Soft, Nontender, Nondistended. MSK: no edema, cyanosis, or clubbing noted Skin: warm, dry Neuro: grossly normal, moves all extremities Psych: Normal affect and thought content  Results for orders placed or performed in visit on 03/10/18 (from the past 72 hour(s))  HgB A1c     Status: Abnormal   Collection Time: 03/10/18  9:38 AM  Result Value Ref Range   Hemoglobin A1C     HbA1c POC (<> result, manual entry)     HbA1c, POC (prediabetic range)     HbA1c, POC (controlled diabetic range) 6.6 0.0 - 7.0 %      Assessment/Plan:  Type 2 diabetes mellitus without complication Controlled and stable. Doing well on metformin 1000mg  BID. Continue current management. Counseled on PSV23, patient will consider and probably get at future visit  HTN (hypertension) Controlled and at goal.  On norvasc 10mg  qd, HCTZ 25mg  qd, lisinopril 40mg  qd. Continue current management.  Mixed hyperlipidemia Continue atorvastatin 40mg  qd.   Health maintenance Due for mammogram in December, ordered today. MM Digital Screening; Future   , DO PGY-3, [facility] 03/10/2018 9:34 AM

## 2018-03-11 ENCOUNTER — Encounter: Payer: Self-pay | Admitting: Family Medicine

## 2018-03-11 LAB — CMP14+EGFR
ALT: 18 IU/L (ref 0–32)
AST: 19 IU/L (ref 0–40)
Albumin/Globulin Ratio: 1.7 (ref 1.2–2.2)
Albumin: 4.6 g/dL (ref 3.5–5.5)
Alkaline Phosphatase: 68 IU/L (ref 39–117)
BILIRUBIN TOTAL: 0.5 mg/dL (ref 0.0–1.2)
BUN/Creatinine Ratio: 17 (ref 9–23)
BUN: 13 mg/dL (ref 6–24)
CALCIUM: 9.8 mg/dL (ref 8.7–10.2)
CHLORIDE: 99 mmol/L (ref 96–106)
CO2: 23 mmol/L (ref 20–29)
Creatinine, Ser: 0.77 mg/dL (ref 0.57–1.00)
GFR calc non Af Amer: 85 mL/min/{1.73_m2} (ref >59)
GFR, EST AFRICAN AMERICAN: 98 mL/min/{1.73_m2} (ref >59)
GLUCOSE: 133 mg/dL — AB (ref 65–99)
Globulin, Total: 2.7 g/dL (ref 1.5–4.5)
Potassium: 3.7 mmol/L (ref 3.5–5.2)
Sodium: 137 mmol/L (ref 134–144)
TOTAL PROTEIN: 7.3 g/dL (ref 6.0–8.5)

## 2018-03-11 LAB — CBC
HEMATOCRIT: 37.4 % (ref 34.0–46.6)
HEMOGLOBIN: 12.4 g/dL (ref 11.1–15.9)
MCH: 26.5 pg — ABNORMAL LOW (ref 26.6–33.0)
MCHC: 33.2 g/dL (ref 31.5–35.7)
MCV: 80 fL (ref 79–97)
Platelets: 264 10*3/uL (ref 150–450)
RBC: 4.68 x10E6/uL (ref 3.77–5.28)
RDW: 16 % — ABNORMAL HIGH (ref 12.3–15.4)
WBC: 7.2 10*3/uL (ref 3.4–10.8)

## 2018-03-25 ENCOUNTER — Encounter: Payer: Self-pay | Admitting: Registered Nurse

## 2018-03-25 ENCOUNTER — Encounter: Payer: Medicaid Other | Attending: Physical Medicine & Rehabilitation | Admitting: Registered Nurse

## 2018-03-25 DIAGNOSIS — Z87891 Personal history of nicotine dependence: Secondary | ICD-10-CM | POA: Diagnosis not present

## 2018-03-25 DIAGNOSIS — M1389 Other specified arthritis, multiple sites: Secondary | ICD-10-CM | POA: Diagnosis not present

## 2018-03-25 DIAGNOSIS — G4730 Sleep apnea, unspecified: Secondary | ICD-10-CM | POA: Diagnosis not present

## 2018-03-25 DIAGNOSIS — L304 Erythema intertrigo: Secondary | ICD-10-CM

## 2018-03-25 DIAGNOSIS — E119 Type 2 diabetes mellitus without complications: Secondary | ICD-10-CM | POA: Insufficient documentation

## 2018-03-25 DIAGNOSIS — Z79891 Long term (current) use of opiate analgesic: Secondary | ICD-10-CM

## 2018-03-25 DIAGNOSIS — G894 Chronic pain syndrome: Secondary | ICD-10-CM | POA: Diagnosis not present

## 2018-03-25 DIAGNOSIS — M2550 Pain in unspecified joint: Secondary | ICD-10-CM

## 2018-03-25 DIAGNOSIS — E7800 Pure hypercholesterolemia, unspecified: Secondary | ICD-10-CM | POA: Insufficient documentation

## 2018-03-25 DIAGNOSIS — M25572 Pain in left ankle and joints of left foot: Secondary | ICD-10-CM | POA: Diagnosis not present

## 2018-03-25 DIAGNOSIS — M25571 Pain in right ankle and joints of right foot: Secondary | ICD-10-CM | POA: Diagnosis not present

## 2018-03-25 DIAGNOSIS — I10 Essential (primary) hypertension: Secondary | ICD-10-CM | POA: Diagnosis not present

## 2018-03-25 DIAGNOSIS — M25561 Pain in right knee: Secondary | ICD-10-CM | POA: Diagnosis not present

## 2018-03-25 DIAGNOSIS — M19011 Primary osteoarthritis, right shoulder: Secondary | ICD-10-CM | POA: Diagnosis not present

## 2018-03-25 DIAGNOSIS — Z5181 Encounter for therapeutic drug level monitoring: Secondary | ICD-10-CM | POA: Diagnosis not present

## 2018-03-25 DIAGNOSIS — M25562 Pain in left knee: Secondary | ICD-10-CM | POA: Diagnosis not present

## 2018-03-25 DIAGNOSIS — M79641 Pain in right hand: Secondary | ICD-10-CM | POA: Diagnosis not present

## 2018-03-25 DIAGNOSIS — M170 Bilateral primary osteoarthritis of knee: Secondary | ICD-10-CM

## 2018-03-25 DIAGNOSIS — M0609 Rheumatoid arthritis without rheumatoid factor, multiple sites: Secondary | ICD-10-CM

## 2018-03-25 DIAGNOSIS — M79642 Pain in left hand: Secondary | ICD-10-CM | POA: Insufficient documentation

## 2018-03-25 MED ORDER — OXYCODONE HCL 5 MG PO TABS: 5.0000 mg | ORAL_TABLET | Freq: Two times a day (BID) | ORAL | 0 refills | Status: DC | PRN

## 2018-03-25 MED ORDER — DICLOFENAC SODIUM 1 % TD GEL: 2.0000 g | Freq: Four times a day (QID) | TRANSDERMAL | 1 refills | Status: DC

## 2018-03-25 NOTE — Progress Notes (Signed)
Subjective:    Patient ID: Tanya Owens, female    DOB: June 23, 1960, 57 y.o.   MRN: [id number]  HPI: [name] is a 57 year old female who returns for follow up appointment for chronic pain and medication refill. She states her pain is located in her bilateral shoulders, bilateral hands, right knee, right lower extremity and bilateral ankles. Also reports joint pain all over. Ms. Sawyer reports increase intensity of pain and she's not receiving any relief with the current dosose of  oxycodone daily dose, we will increase her tablets, she verbalizes understanding. She rates her pain 10. Her current exercise regime is walking.   Ms. Shoun states her  PCP is following her rash under breast and the powder she was prescribed is not working, she was instructed to call her PCP, she verbalizes understanding.   Ms. Sano Morphine Equivalent is 7.50 MME. Last UDS was Performed on 11/26/2017, it was consistent.    Pain Inventory Average Pain 10 Pain Right Now 10 My pain is aching  In the last 24 hours, has pain interfered with the following? General activity 8 Relation with others 10 Enjoyment of life 10 What TIME of day is your pain at its worst? daytime Sleep (in general) Poor  Pain is worse with: walking, standing and some activites Pain improves with: rest Relief from Meds: 0  Mobility walk without assistance how many minutes can you walk? 5  Function not employed: date last employed .  Neuro/Psych No problems in this area  Prior Studies Any changes since last visit?  no  Physicians involved in your care Any changes since last visit?  no   Family History  Problem Relation Age of Onset  . Heart attack Mother   . Diabetes Mother   . Hypercholesterolemia Mother   . Hypertension Mother   . Diabetes Sister   . Hypertension Brother    Social History   Socioeconomic History  . Marital status: Single    Spouse name: Not on file  . Number of children:  Not on file  . Years of education: Not on file  . Highest education level: Not on file  Occupational History  . Not on file  Social Needs  . Financial resource strain: Not on file  . Food insecurity:    Worry: Not on file    Inability: Not on file  . Transportation needs:    Medical: Not on file    Non-medical: Not on file  Tobacco Use  . Smoking status: Former Smoker    Last attempt to quit: 12/27/2012    Years since quitting: 5.2  . Smokeless tobacco: Never Used  Substance and Sexual Activity  . Alcohol use: No  . Drug use: No  . Sexual activity: Yes    Birth control/protection: Post-menopausal  Lifestyle  . Physical activity:    Days per week: Not on file    Minutes per session: Not on file  . Stress: Not on file  Relationships  . Social connections:    Talks on phone: Not on file    Gets together: Not on file    Attends religious service: Not on file    Active member of club or organization: Not on file    Attends meetings of clubs or organizations: Not on file    Relationship status: Not on file  Other Topics Concern  . Not on file  Social History Narrative  . Not on file   Past Surgical  History:  Procedure Laterality Date  . CARDIAC CATHETERIZATION  10/18/2007   Normal coronaries, systemic hypertension   Past Medical History:  Diagnosis Date  . Arthritis 2000  . Diabetes mellitus without complication ([facility]) 2005  . Hypercholesteremia 2005  . Hypertension 2005  . Sleep apnea 2010  . Sleep disorder    There were no vitals taken for this visit.  Opioid Risk Score:   Fall Risk Score:  `1  Depression screen PHQ 2/9  Depression screen [facility] 2/9 03/10/2018 12/13/2017 08/16/2017 07/02/2017 06/02/2017 04/16/2017 02/01/2017  Decreased Interest 0 0 0 0 0 0 0  Down, Depressed, Hopeless 0 0 0 0 0 0 0  PHQ - 2 Score 0 0 0 0 0 0 0    Review of Systems  Constitutional: Negative.   HENT: Negative.   Eyes: Negative.   Respiratory: Negative.   Cardiovascular: Negative.    Gastrointestinal: Negative.   Endocrine: Negative.   Genitourinary: Negative.   Musculoskeletal: Positive for arthralgias.  Skin: Negative.   Neurological: Negative.   Hematological: Negative.   Psychiatric/Behavioral: Negative.   All other systems reviewed and are negative.      Objective:   Physical Exam  Constitutional: She is oriented to person, place, and time. She appears well-developed and well-nourished.  HENT:  Head: Normocephalic and atraumatic.  Neck: Normal range of motion. Neck supple.  Cardiovascular: Normal rate and regular rhythm.  Pulmonary/Chest: Effort normal and breath sounds normal.  Musculoskeletal:  Normal Muscle Bulk and Muscle Testing Reveals: Upper Extremities: Decreased ROM  45 Degrees and Muscle Strength 5/5 Bilateral AC Joint Tenderness Lower Extremities: Decreased ROM and Muscle Strength 5/5 Bilateral Lower Extremities Flexion Produces Pain into Bilateral Patella's Arises from chair with ease Narrow Based Gait    Neurological: She is alert and oriented to person, place, and time.  Nursing note and vitals reviewed.         Assessment & Plan:  1. Seronegative polyarthritis:: Rheumatology Following.  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 Chronic Pain Syndrome: Increase: RX: Oxycodone 5 mg one tablet twice a day as needed. #60.  We will continue the opioid monitoring program, this consists of regular clinic visits, examinations, urine drug screen, pill counts as well as use of [place] Controlled Substance Reporting system. 4. [facility]: PCP Following   F/U in 1 month

## 2018-03-28 ENCOUNTER — Telehealth: Payer: Self-pay | Admitting: Family Medicine

## 2018-03-28 MED ORDER — NYSTATIN 100000 UNIT/GM EX CREA: 1.0000 "application " | TOPICAL_CREAM | Freq: Four times a day (QID) | CUTANEOUS | 1 refills | Status: AC

## 2018-03-28 NOTE — Telephone Encounter (Signed)
Pt wanting something different called in for her rash under her breast and between her legs. Pt would like a cream called in that her insurance will cover. Her pharmacy is [facility] on [address]. Please call pt once this has been done.

## 2018-03-28 NOTE — Telephone Encounter (Signed)
Will forward to Dr. Yoo to advise.  Jazmin Hartsell,CMA  

## 2018-03-28 NOTE — Telephone Encounter (Signed)
Nystatin powder changed to cream. Should be covered under medicaid formulary.

## 2018-04-18 ENCOUNTER — Encounter (HOSPITAL_COMMUNITY): Payer: Self-pay

## 2018-04-18 ENCOUNTER — Emergency Department (HOSPITAL_COMMUNITY)
Admission: EM | Admit: 2018-04-18 | Discharge: 2018-04-18 | Disposition: A | Discharge status: Home / Self Care | Payer: Medicaid Other | Attending: Emergency Medicine | Admitting: Emergency Medicine

## 2018-04-18 DIAGNOSIS — Z7984 Long term (current) use of oral hypoglycemic drugs: Secondary | ICD-10-CM | POA: Insufficient documentation

## 2018-04-18 DIAGNOSIS — B356 Tinea cruris: Secondary | ICD-10-CM | POA: Diagnosis not present

## 2018-04-18 DIAGNOSIS — E119 Type 2 diabetes mellitus without complications: Secondary | ICD-10-CM | POA: Insufficient documentation

## 2018-04-18 DIAGNOSIS — Z79899 Other long term (current) drug therapy: Secondary | ICD-10-CM | POA: Diagnosis not present

## 2018-04-18 DIAGNOSIS — I10 Essential (primary) hypertension: Secondary | ICD-10-CM | POA: Diagnosis not present

## 2018-04-18 DIAGNOSIS — B354 Tinea corporis: Secondary | ICD-10-CM

## 2018-04-18 DIAGNOSIS — R21 Rash and other nonspecific skin eruption: Secondary | ICD-10-CM | POA: Diagnosis present

## 2018-04-18 MED ORDER — CLOTRIMAZOLE-BETAMETHASONE 1-0.05 % EX CREA: TOPICAL_CREAM | CUTANEOUS | 0 refills | Status: DC

## 2018-04-18 NOTE — ED Provider Notes (Signed)
[facility]-EMERGENCY DEPT Provider Note   CSN: [id number] Arrival date & time: 04/18/18  1114     History   Chief Complaint Chief Complaint  Patient presents with  . Rash    HPI Tanya Owens is a 57 y.o. female.  57 year old female presents with complaint of a rash under her right breast and groin areas.  Patient states the rash has been present for several months, she has been to her PCP who gave her a prescription for powder which she has been using as well as cornstarch without relief.  Patient has a history of diabetes, states her blood sugars are well controlled and typically range from 100-120.  Reports the rash is itchy.  No other complaints or concerns today.     Past Medical History:  Diagnosis Date  . Arthritis 2000  . Diabetes mellitus without complication (HCC) 2005  . Hypercholesteremia 2005  . Hypertension 2005  . Sleep apnea 2010  . Sleep disorder     Patient Active Problem List   Diagnosis Date Noted  . Seronegative arthropathy of multiple sites 07/30/2017  . Encounter for medication management 04/19/2017  . Acromioclavicular joint arthritis 03/26/2017  . Cyclic citrullinated peptide (CCP) antibody positive 10/28/2016  . Obesity (BMI 30-39.9) 11/28/2014  . Type 2 diabetes mellitus without complication (HCC) 11/28/2014  . HTN (hypertension) 06/21/2013  . Mixed hyperlipidemia 06/21/2013    Past Surgical History:  Procedure Laterality Date  . CARDIAC CATHETERIZATION  10/18/2007   Normal coronaries, systemic hypertension     OB History   None      Home Medications    Prior to Admission medications   Medication Sig Start Date End Date Taking? Authorizing Provider  amLODipine (NORVASC) 10 MG tablet Take 1 tablet (10 mg total) by mouth daily. 11/19/17   Leland Her, DO  atorvastatin (LIPITOR) 40 MG tablet take 1 tablet by mouth once daily 09/22/17   Leland Her, DO  clotrimazole-betamethasone (LOTRISONE) cream Apply to  affected area 2 times daily prn 04/18/18   Army Melia A, PA-C  diclofenac sodium (VOLTAREN) 1 % GEL Apply 2 g topically 4 (four) times daily. 03/25/18   Jones Bales, NP  Etanercept (ENBREL MINI) 50 MG/ML SOCT Inject into the skin. 09/20/17   [provider]  folic acid (FOLVITE) 1 MG tablet Take 1 tablet (1 mg total) by mouth daily. 11/19/17 11/19/18  Leland Her, DO  hydrochlorothiazide (HYDRODIURIL) 25 MG tablet Take 1 tablet (25 mg total) by mouth daily. 11/19/17   Leland Her, DO  lisinopril (PRINIVIL,ZESTRIL) 40 MG tablet Take 1 tablet (40 mg total) by mouth daily. 12/13/17   Leland Her, DO  metFORMIN (GLUCOPHAGE) 1000 MG tablet Take 1 tablet (1,000 mg total) by mouth 2 (two) times daily with a meal. 09/22/17 03/21/18  Leland Her, DO  methotrexate (RHEUMATREX) 5 MG tablet Take 6 tablets (30 mg total) by mouth once a week. Caution: Chemotherapy. Protect from light. 12/13/17   Leland Her, DO  oxyCODONE (OXY IR/ROXICODONE) 5 MG immediate release tablet Take 1 tablet (5 mg total) by mouth 2 (two) times daily as needed for moderate pain. 03/25/18   Jones Bales, NP  predniSONE (DELTASONE) 10 MG tablet Take 1 tablet (10 mg total) by mouth daily with breakfast. 12/13/17   Jeneen Rinks J, DO  RESTASIS 0.05 % ophthalmic emulsion Apply 1 drop to eye 2 (two) times daily. Both eyes 01/20/18   [provider]  traMADol (ULTRAM) 50 MG tablet Take 2 tabs every 8 hours for a max of 6 tabs in 24 hours 08/13/17   Nestor Ramp, MD    Family History Family History  Problem Relation Age of Onset  . Heart attack Mother   . Diabetes Mother   . Hypercholesterolemia Mother   . Hypertension Mother   . Diabetes Sister   . Hypertension Brother     Social History Social History   Tobacco Use  . Smoking status: Former Smoker    Last attempt to quit: 12/27/2012    Years since quitting: 5.3  . Smokeless tobacco: Never Used  Substance Use Topics  . Alcohol use: No  . Drug use: No      Allergies   Tylenol [acetaminophen]   Review of Systems Review of Systems  Constitutional: Negative for fever.  Musculoskeletal: Negative for arthralgias and myalgias.  Skin: Positive for rash. Negative for wound.  Allergic/Immunologic: Positive for immunocompromised state.  Hematological: Negative for adenopathy.  Psychiatric/Behavioral: Negative for confusion.  All other systems reviewed and are negative.    Physical Exam Updated Vital Signs BP (!) 162/95 (BP Location: Left Arm)   Pulse 76   Temp 98.6 F (37 C) (Oral)   Resp 18   SpO2 100%   Physical Exam  Constitutional: She is oriented to person, place, and time. She appears well-developed and well-nourished. No distress.  HENT:  Head: Normocephalic and atraumatic.  Pulmonary/Chest: Effort normal.  Neurological: She is alert and oriented to person, place, and time.  Skin: Skin is warm and dry. Rash noted. She is not diaphoretic.     Psychiatric: She has a normal mood and affect. Her behavior is normal.  Nursing note and vitals reviewed.    ED Treatments / Results  Labs (all labs ordered are listed, but only abnormal results are displayed) Labs Reviewed - No data to display  EKG None  Radiology No results found.  Procedures Procedures (including critical care time)  Medications Ordered in ED Medications - No data to display   Initial Impression / Assessment and Plan / ED Course  I have reviewed the triage vital signs and the nursing notes.  Pertinent labs & imaging results that were available during my care of the patient were reviewed by me and considered in my medical decision making (see chart for details).  Clinical Course as of Apr 18 1320  Mon Apr 18, 2018  3225 57 year old female with history of diabetes presents with fungal rash under her right breast and bilateral inguinal areas.  Patient has been using prescription powder from her PCP office for the past 6 months without  improvement.  Patient was given prescription for combo antifungal steroid cream to apply to the area and advised to follow-up with her PCP for further management.  Advised patient this will take several months to clear and she needs to continue treating for 2 weeks past resolution.   [LM]    Clinical Course User Index [LM] Jeannie Fend, PA-C   Final Clinical Impressions(s) / ED Diagnoses   Final diagnoses:  Tinea cruris  Tinea corporis    ED Discharge Orders         Ordered    clotrimazole-betamethasone (LOTRISONE) cream     04/18/18 1218           Jeannie Fend, PA-C 04/18/18 1321    Raeford Razor, MD 04/18/18 1554

## 2018-04-18 NOTE — ED Triage Notes (Signed)
Patient c/o a rash under right breast and groin area x68months. Patient has tried prescriptions that are not helping.   A/Ox4 Ambulatory in triage.

## 2018-04-18 NOTE — ED Notes (Signed)
Bed: WTR7 Expected date:  Expected time:  Means of arrival:  Comments: 

## 2018-04-18 NOTE — Discharge Instructions (Addendum)
Contact your doctor's office to schedule follow-up appointment. Apply cream twice daily as prescribed. Be sure to continue to apply the cream for 2 weeks after rash resolves.

## 2018-04-19 ENCOUNTER — Encounter: Payer: Self-pay | Admitting: Registered Nurse

## 2018-04-19 ENCOUNTER — Other Ambulatory Visit: Payer: Self-pay | Admitting: Family Medicine

## 2018-04-19 ENCOUNTER — Encounter: Payer: Medicaid Other | Attending: Physical Medicine & Rehabilitation | Admitting: Registered Nurse

## 2018-04-19 VITALS — BP 140/86 | HR 97 | Ht 69.0 in | Wt 246.0 lb

## 2018-04-19 DIAGNOSIS — M1389 Other specified arthritis, multiple sites: Secondary | ICD-10-CM

## 2018-04-19 DIAGNOSIS — M25571 Pain in right ankle and joints of right foot: Secondary | ICD-10-CM | POA: Diagnosis not present

## 2018-04-19 DIAGNOSIS — M19011 Primary osteoarthritis, right shoulder: Secondary | ICD-10-CM | POA: Insufficient documentation

## 2018-04-19 DIAGNOSIS — G4730 Sleep apnea, unspecified: Secondary | ICD-10-CM | POA: Insufficient documentation

## 2018-04-19 DIAGNOSIS — Z87891 Personal history of nicotine dependence: Secondary | ICD-10-CM | POA: Insufficient documentation

## 2018-04-19 DIAGNOSIS — M25572 Pain in left ankle and joints of left foot: Secondary | ICD-10-CM | POA: Diagnosis not present

## 2018-04-19 DIAGNOSIS — M25561 Pain in right knee: Secondary | ICD-10-CM | POA: Insufficient documentation

## 2018-04-19 DIAGNOSIS — M79642 Pain in left hand: Secondary | ICD-10-CM | POA: Insufficient documentation

## 2018-04-19 DIAGNOSIS — Z79899 Other long term (current) drug therapy: Secondary | ICD-10-CM

## 2018-04-19 DIAGNOSIS — I10 Essential (primary) hypertension: Secondary | ICD-10-CM

## 2018-04-19 DIAGNOSIS — E119 Type 2 diabetes mellitus without complications: Secondary | ICD-10-CM

## 2018-04-19 DIAGNOSIS — E7800 Pure hypercholesterolemia, unspecified: Secondary | ICD-10-CM | POA: Diagnosis not present

## 2018-04-19 DIAGNOSIS — M170 Bilateral primary osteoarthritis of knee: Secondary | ICD-10-CM

## 2018-04-19 DIAGNOSIS — G894 Chronic pain syndrome: Secondary | ICD-10-CM

## 2018-04-19 DIAGNOSIS — M25562 Pain in left knee: Secondary | ICD-10-CM | POA: Diagnosis not present

## 2018-04-19 DIAGNOSIS — M79641 Pain in right hand: Secondary | ICD-10-CM | POA: Diagnosis not present

## 2018-04-19 DIAGNOSIS — Z79631 Long term (current) use of antimetabolite agent: Secondary | ICD-10-CM

## 2018-04-19 DIAGNOSIS — M2550 Pain in unspecified joint: Secondary | ICD-10-CM

## 2018-04-19 DIAGNOSIS — Z5181 Encounter for therapeutic drug level monitoring: Secondary | ICD-10-CM | POA: Insufficient documentation

## 2018-04-19 DIAGNOSIS — Z79891 Long term (current) use of opiate analgesic: Secondary | ICD-10-CM | POA: Diagnosis not present

## 2018-04-19 DIAGNOSIS — M0609 Rheumatoid arthritis without rheumatoid factor, multiple sites: Secondary | ICD-10-CM

## 2018-04-19 MED ORDER — OXYCODONE HCL 5 MG PO TABS: 5.0000 mg | ORAL_TABLET | Freq: Two times a day (BID) | ORAL | 0 refills | Status: DC | PRN

## 2018-04-19 NOTE — Progress Notes (Signed)
Subjective:    Patient ID: [name], female    DOB: 07-Dec-1960, 57 y.o.   MRN: [id number]  HPI: NOREENE BOREMAN is a 57 y.o. female who returns for follow up appointment for chronic pain and medication refill. She states her pain is located in her  bilateral hands R>L, left shoulder and bilateral ankles R>L. Also reports right hand with redness, no warmth and no  tenderness noted, no fever noted. She rates her pain 9. Her current exercise regime is walking and performing stretching exercises.  Ms. Sabas Morphine equivalent is 15.00  MME.    Pain Inventory Average Pain 8 Pain Right Now 9 My pain is sharp and stabbing  In the last 24 hours, has pain interfered with the following? General activity na Relation with others 0 Enjoyment of life 0 What TIME of day is your pain at its worst? na Sleep (in general) Fair  Pain is worse with: some activites Pain improves with: na Relief from Meds: na  Mobility walk without assistance ability to climb steps?  yes do you drive?  no  Function disabled: date disabled .  Neuro/Psych No problems in this area  Prior Studies Any changes since last visit?  no  Physicians involved in your care Any changes since last visit?  no   Family History  Problem Relation Age of Onset  . Heart attack Mother   . Diabetes Mother   . Hypercholesterolemia Mother   . Hypertension Mother   . Diabetes Sister   . Hypertension Brother    Social History   Socioeconomic History  . Marital status: Single    Spouse name: Not on file  . Number of children: Not on file  . Years of education: Not on file  . Highest education level: Not on file  Occupational History  . Not on file  Social Needs  . Financial resource strain: Not on file  . Food insecurity:    Worry: Not on file    Inability: Not on file  . Transportation needs:    Medical: Not on file    Non-medical: Not on file  Tobacco Use  . Smoking status: Former Smoker    Last  attempt to quit: 12/27/2012    Years since quitting: 5.3  . Smokeless tobacco: Never Used  Substance and Sexual Activity  . Alcohol use: No  . Drug use: No  . Sexual activity: Yes    Birth control/protection: Post-menopausal  Lifestyle  . Physical activity:    Days per week: Not on file    Minutes per session: Not on file  . Stress: Not on file  Relationships  . Social connections:    Talks on phone: Not on file    Gets together: Not on file    Attends religious service: Not on file    Active member of club or organization: Not on file    Attends meetings of clubs or organizations: Not on file    Relationship status: Not on file  Other Topics Concern  . Not on file  Social History Narrative  . Not on file   Past Surgical History:  Procedure Laterality Date  . CARDIAC CATHETERIZATION  10/18/2007   Normal coronaries, systemic hypertension   Past Medical History:  Diagnosis Date  . Arthritis 2000  . Diabetes mellitus without complication (HCC) 2005  . Hypercholesteremia 2005  . Hypertension 2005  . Sleep apnea 2010  . Sleep disorder    BP 140/86  Pulse 97   Ht 5\' 9"  (1.753 m)   Wt 246 lb (111.6 kg)   SpO2 97%   BMI 36.33 kg/m   Opioid Risk Score:   Fall Risk Score:  `1  Depression screen PHQ 2/9  Depression screen [facility] 2/9 03/10/2018 12/13/2017 08/16/2017 07/02/2017 06/02/2017 04/16/2017 02/01/2017  Decreased Interest 0 0 0 0 0 0 0  Down, Depressed, Hopeless 0 0 0 0 0 0 0  PHQ - 2 Score 0 0 0 0 0 0 0     Review of Systems  Constitutional: Negative.   HENT: Negative.   Eyes: Negative.   Respiratory: Negative.   Cardiovascular: Negative.   Gastrointestinal: Negative.   Endocrine: Negative.   Genitourinary: Negative.   Musculoskeletal: Positive for arthralgias and myalgias.  Skin: Negative.   Allergic/Immunologic: Negative.   Neurological: Negative.   Hematological: Negative.   Psychiatric/Behavioral: Negative.   All other systems reviewed and are  negative.      Objective:   Physical Exam  Constitutional: She is oriented to person, place, and time. She appears well-developed and well-nourished.  HENT:  Head: Normocephalic and atraumatic.  Neck: Normal range of motion. Neck supple.  Cardiovascular: Normal rate and regular rhythm.  Pulmonary/Chest: Effort normal and breath sounds normal.  Musculoskeletal:  Normal Muscle Bulk and Muscle Testing Reveals:  Upper Extremities: Full ROM and Muscle Strength 5/5  Lower Extremities: Full ROM and Muscle Strength 5/5 Arises from Table with ease Narrow Based Gait    Neurological: She is alert and oriented to person, place, and time.  Skin: Skin is warm and dry.  Psychiatric: She has a normal mood and affect. Her behavior is normal.  Nursing note and vitals reviewed.         Assessment & Plan:  1. Seronegative polyarthritis:: Rheumatology Following. 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 04/19/2018 3. Chronic Pain Syndrome: Refilled:  Oxycodone 5 mg one tablet twice a day as needed. #60.  We will continue the opioid monitoring program, this consists of regular clinic visits, examinations, urine drug screen, pill counts as well as use of 14/07/2017 Controlled Substance Reporting system.  20  minutes of face to face patient care time was spent during this visit. All questions were encouraged and answered.   F/U in 1 month

## 2018-04-19 NOTE — Telephone Encounter (Signed)
Pt called and would like her medications refilled. I asked which ones and she said "just tell her I need them all refilled."

## 2018-04-20 MED ORDER — HYDROCHLOROTHIAZIDE 25 MG PO TABS: 25.0000 mg | ORAL_TABLET | Freq: Every day | ORAL | 1 refills | Status: DC

## 2018-04-20 MED ORDER — DICLOFENAC SODIUM 1 % TD GEL: 2.0000 g | Freq: Four times a day (QID) | TRANSDERMAL | 1 refills | Status: DC

## 2018-04-20 MED ORDER — ATORVASTATIN CALCIUM 40 MG PO TABS: ORAL_TABLET | ORAL | 1 refills | Status: DC

## 2018-04-20 MED ORDER — CLOTRIMAZOLE-BETAMETHASONE 1-0.05 % EX CREA: TOPICAL_CREAM | CUTANEOUS | 0 refills | Status: DC

## 2018-04-20 MED ORDER — RESTASIS 0.05 % OP EMUL: 1.0000 [drp] | Freq: Two times a day (BID) | OPHTHALMIC | 4 refills | Status: DC

## 2018-04-20 MED ORDER — AMLODIPINE BESYLATE 10 MG PO TABS: 10.0000 mg | ORAL_TABLET | Freq: Every day | ORAL | 1 refills | Status: DC

## 2018-04-20 MED ORDER — FOLIC ACID 1 MG PO TABS: 1.0000 mg | ORAL_TABLET | Freq: Every day | ORAL | 11 refills | Status: AC

## 2018-04-20 MED ORDER — METFORMIN HCL 1000 MG PO TABS: 1000.0000 mg | ORAL_TABLET | Freq: Two times a day (BID) | ORAL | 1 refills | Status: DC

## 2018-04-20 MED ORDER — LISINOPRIL 40 MG PO TABS: 40.0000 mg | ORAL_TABLET | Freq: Every day | ORAL | 3 refills | Status: DC

## 2018-04-20 NOTE — Telephone Encounter (Signed)
Methotrexate is now under management of rheumatology

## 2018-05-05 ENCOUNTER — Telehealth: Payer: Self-pay

## 2018-05-05 DIAGNOSIS — M25571 Pain in right ankle and joints of right foot: Secondary | ICD-10-CM

## 2018-05-05 DIAGNOSIS — M25572 Pain in left ankle and joints of left foot: Secondary | ICD-10-CM

## 2018-05-05 NOTE — Telephone Encounter (Signed)
Patient called, stated that her current medication regiment is not helping and is having increased pain.  Please advise.

## 2018-05-06 ENCOUNTER — Telehealth: Payer: Self-pay | Admitting: Registered Nurse

## 2018-05-06 NOTE — Telephone Encounter (Signed)
Return Tanya Owens call, she's experiencing increase intensity of bilateral ankle pain R>L. Also reports her right ankle keeping giving out. We will order bilateral ankle X-ray's. She may increase her Oxycodone to three times a day as needed, she verbalizes understanding. Tanya Owens will call office with her Oxycodone Tablet count, at this time she is not home .

## 2018-05-06 NOTE — Telephone Encounter (Signed)
Tanya Owens called office, she reports she reports she has 24 tablets of Oxycodone, she reports the bottle date is 03/28/2018.  Oxycodone  Bottle on 12/ 12/ 19, she has 18 of Oxycodone tablets, she has 42 tablets, which is correct.  PDMP was Reviewed, she was instructed to increase Oxycodone to three times a day as needed for pain. Also instructed to call office on 05/16/18 or 05/17/18, she verbalizes understanding. She has a scheduled appointment on 05/20/18. She verbalizes understanding.

## 2018-05-20 ENCOUNTER — Encounter: Payer: Medicaid Other | Attending: Physical Medicine & Rehabilitation | Admitting: Registered Nurse

## 2018-05-20 ENCOUNTER — Other Ambulatory Visit: Payer: Self-pay

## 2018-05-20 ENCOUNTER — Encounter: Payer: Self-pay | Admitting: Registered Nurse

## 2018-05-20 VITALS — BP 139/84 | HR 74 | Ht 66.0 in | Wt 248.0 lb

## 2018-05-20 DIAGNOSIS — M19011 Primary osteoarthritis, right shoulder: Secondary | ICD-10-CM | POA: Insufficient documentation

## 2018-05-20 DIAGNOSIS — M79642 Pain in left hand: Secondary | ICD-10-CM | POA: Diagnosis not present

## 2018-05-20 DIAGNOSIS — I10 Essential (primary) hypertension: Secondary | ICD-10-CM | POA: Diagnosis not present

## 2018-05-20 DIAGNOSIS — G894 Chronic pain syndrome: Secondary | ICD-10-CM | POA: Diagnosis not present

## 2018-05-20 DIAGNOSIS — M79641 Pain in right hand: Secondary | ICD-10-CM | POA: Diagnosis not present

## 2018-05-20 DIAGNOSIS — Z87891 Personal history of nicotine dependence: Secondary | ICD-10-CM | POA: Diagnosis not present

## 2018-05-20 DIAGNOSIS — M1389 Other specified arthritis, multiple sites: Secondary | ICD-10-CM | POA: Diagnosis not present

## 2018-05-20 DIAGNOSIS — G4730 Sleep apnea, unspecified: Secondary | ICD-10-CM | POA: Insufficient documentation

## 2018-05-20 DIAGNOSIS — E119 Type 2 diabetes mellitus without complications: Secondary | ICD-10-CM | POA: Diagnosis not present

## 2018-05-20 DIAGNOSIS — M25562 Pain in left knee: Secondary | ICD-10-CM | POA: Insufficient documentation

## 2018-05-20 DIAGNOSIS — E7800 Pure hypercholesterolemia, unspecified: Secondary | ICD-10-CM | POA: Diagnosis not present

## 2018-05-20 DIAGNOSIS — M25571 Pain in right ankle and joints of right foot: Secondary | ICD-10-CM | POA: Insufficient documentation

## 2018-05-20 DIAGNOSIS — Z79891 Long term (current) use of opiate analgesic: Secondary | ICD-10-CM | POA: Diagnosis not present

## 2018-05-20 DIAGNOSIS — Z5181 Encounter for therapeutic drug level monitoring: Secondary | ICD-10-CM

## 2018-05-20 DIAGNOSIS — M170 Bilateral primary osteoarthritis of knee: Secondary | ICD-10-CM

## 2018-05-20 DIAGNOSIS — M25561 Pain in right knee: Secondary | ICD-10-CM | POA: Diagnosis not present

## 2018-05-20 DIAGNOSIS — M25572 Pain in left ankle and joints of left foot: Secondary | ICD-10-CM | POA: Insufficient documentation

## 2018-05-20 DIAGNOSIS — M2550 Pain in unspecified joint: Secondary | ICD-10-CM | POA: Diagnosis not present

## 2018-05-20 MED ORDER — OXYCODONE HCL 5 MG PO TABS: 5.0000 mg | ORAL_TABLET | Freq: Three times a day (TID) | ORAL | 0 refills | Status: DC | PRN

## 2018-05-20 NOTE — Progress Notes (Signed)
Subjective:    Patient ID: Tanya MainSelina L Owens, female    DOB: [birth year], 58 y.o.   MRN: [id number]  HPI: Tanya Owens is a 58 y.o. female who returns for follow up appointment for chronic pain and medication refill. She states her pain is located in her right hand, also reports with change in her analgesic her pain is controlled with current medication regimen. She rates her pain 0 . Her current exercise regime is walking, encouraged to increase HEP as tolerated, she verbalizes understanding.   Tanya Owens Morphine equivalent is 15.00 MME.  Last UDS was Performed on 11/26/2017, it was consistent. UDS ordered today.   Pain Inventory Average Pain 0 Pain Right Now 0 My pain is none  In the last 24 hours, has pain interfered with the following? General activity 0 Relation with others 0 Enjoyment of life 0 What TIME of day is your pain at its worst? none Sleep (in general) Good  Pain is worse with: none Pain improves with: medication Relief from Meds: 7  Mobility Do you have any goals in this area?  no  Function Do you have any goals in this area?  no  Neuro/Psych No problems in this area  Prior Studies Any changes since last visit?  no  Physicians involved in your care Any changes since last visit?  no   Family History  Problem Relation Age of Onset  . Heart attack Mother   . Diabetes Mother   . Hypercholesterolemia Mother   . Hypertension Mother   . Diabetes Sister   . Hypertension Brother    Social History   Socioeconomic History  . Marital status: Single    Spouse name: Not on file  . Number of children: Not on file  . Years of education: Not on file  . Highest education level: Not on file  Occupational History  . Not on file  Social Needs  . Financial resource strain: Not on file  . Food insecurity:    Worry: Not on file    Inability: Not on file  . Transportation needs:    Medical: Not on file    Non-medical: Not on file  Tobacco Use  .  Smoking status: Former Smoker    Last attempt to quit: 12/27/2012    Years since quitting: 5.3  . Smokeless tobacco: Never Used  Substance and Sexual Activity  . Alcohol use: No  . Drug use: No  . Sexual activity: Yes    Birth control/protection: Post-menopausal  Lifestyle  . Physical activity:    Days per week: Not on file    Minutes per session: Not on file  . Stress: Not on file  Relationships  . Social connections:    Talks on phone: Not on file    Gets together: Not on file    Attends religious service: Not on file    Active member of club or organization: Not on file    Attends meetings of clubs or organizations: Not on file    Relationship status: Not on file  Other Topics Concern  . Not on file  Social History Narrative  . Not on file   Past Surgical History:  Procedure Laterality Date  . CARDIAC CATHETERIZATION  10/18/2007   Normal coronaries, systemic hypertension   Past Medical History:  Diagnosis Date  . Arthritis 2000  . Diabetes mellitus without complication (HCC) 2005  . Hypercholesteremia 2005  . Hypertension 2005  . Sleep apnea 2010  . Sleep  disorder    BP 139/84   Pulse 74   Ht 5\' 6"  (1.676 m)   Wt 248 lb (112.5 kg)   SpO2 98%   BMI 40.03 kg/m   Opioid Risk Score:   Fall Risk Score:  `1  Depression screen PHQ 2/9  Depression screen [facility] 2/9 05/20/2018 03/10/2018 12/13/2017 08/16/2017 07/02/2017 06/02/2017 04/16/2017  Decreased Interest 0 0 0 0 0 0 0  Down, Depressed, Hopeless 0 0 0 0 0 0 0  PHQ - 2 Score 0 0 0 0 0 0 0    Review of Systems  Constitutional: Negative.   HENT: Negative.   Eyes: Negative.   Respiratory: Negative.   Cardiovascular: Negative.   Gastrointestinal: Negative.   Endocrine: Negative.   Genitourinary: Negative.   Musculoskeletal: Negative.   Allergic/Immunologic: Negative.   Neurological: Negative.   Hematological: Negative.   Psychiatric/Behavioral: Negative.   All other systems reviewed and are negative.        Objective:   Physical Exam Vitals signs and nursing note reviewed.  Constitutional:      Appearance: Normal appearance.  Neck:     Musculoskeletal: Normal range of motion and neck supple.  Cardiovascular:     Rate and Rhythm: Normal rate and regular rhythm.     Pulses: Normal pulses.     Heart sounds: Normal heart sounds.  Pulmonary:     Effort: Pulmonary effort is normal.     Breath sounds: Normal breath sounds.  Musculoskeletal:     Comments: Normal Muscle Bulk and Muscle Testing Reveals:  Upper Extremities:Full  ROM and Muscle Strength 5/5 Lower Extremities: Full ROM and Muscle Strength 5/5 Arises from chair with ease Narrow Based Gait   Skin:    General: Skin is warm and dry.  Neurological:     Mental Status: She is alert and oriented to person, place, and time.  Psychiatric:        Mood and Affect: Mood normal.        Behavior: Behavior normal.           Assessment & Plan:  1. Seronegative polyarthritis:: Rheumatology Following. 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 05/20/2018 3. Chronic Pain Syndrome: Refilled:  Oxycodone 5 mg one tablet three times a  day as needed. #60. We will continue the opioid monitoring program, this consists of regular clinic visits, examinations, urine drug screen, pill counts as well as use of [place] Controlled Substance Reporting system.  20  minutes of face to face patient care time was spent during this visit. All questions were encouraged and answered.   F/U in 1 month

## 2018-05-20 NOTE — Addendum Note (Signed)
Addended by: Doreene Eland on: 05/20/2018 09:01 AM   Modules accepted: Orders

## 2018-05-23 ENCOUNTER — Telehealth: Payer: Self-pay | Admitting: *Deleted

## 2018-05-23 NOTE — Telephone Encounter (Signed)
Prior authorization submitted to [facility] today 05/23/2018

## 2018-05-23 NOTE — Telephone Encounter (Signed)
Tanya Owens has called and says the pharmacy told her that they are waiting on DR to do something about insurance so she can get her medication (?oxycodone?).  She did not say what, I assume a prior authorization.  Has anyone initiated one on her?

## 2018-05-25 ENCOUNTER — Telehealth: Payer: Self-pay | Admitting: *Deleted

## 2018-05-25 LAB — TOXASSURE SELECT,+ANTIDEPR,UR

## 2018-05-25 NOTE — Telephone Encounter (Signed)
Urine drug screen for this encounter is consistent for prescribed medication 

## 2018-05-27 ENCOUNTER — Encounter: Payer: Self-pay | Admitting: Family Medicine

## 2018-06-16 ENCOUNTER — Encounter: Payer: Medicaid Other | Admitting: Registered Nurse

## 2018-06-16 ENCOUNTER — Encounter: Payer: Self-pay | Admitting: Registered Nurse

## 2018-06-16 VITALS — BP 124/84 | HR 92 | Ht 69.0 in | Wt 245.0 lb

## 2018-06-16 DIAGNOSIS — M25512 Pain in left shoulder: Secondary | ICD-10-CM | POA: Diagnosis not present

## 2018-06-16 DIAGNOSIS — Z87891 Personal history of nicotine dependence: Secondary | ICD-10-CM | POA: Diagnosis not present

## 2018-06-16 DIAGNOSIS — M79641 Pain in right hand: Secondary | ICD-10-CM | POA: Diagnosis not present

## 2018-06-16 DIAGNOSIS — E7800 Pure hypercholesterolemia, unspecified: Secondary | ICD-10-CM | POA: Diagnosis not present

## 2018-06-16 DIAGNOSIS — M25571 Pain in right ankle and joints of right foot: Secondary | ICD-10-CM

## 2018-06-16 DIAGNOSIS — M170 Bilateral primary osteoarthritis of knee: Secondary | ICD-10-CM | POA: Diagnosis not present

## 2018-06-16 DIAGNOSIS — M2550 Pain in unspecified joint: Secondary | ICD-10-CM | POA: Diagnosis not present

## 2018-06-16 DIAGNOSIS — M79642 Pain in left hand: Secondary | ICD-10-CM | POA: Diagnosis not present

## 2018-06-16 DIAGNOSIS — Z5181 Encounter for therapeutic drug level monitoring: Secondary | ICD-10-CM

## 2018-06-16 DIAGNOSIS — M25562 Pain in left knee: Secondary | ICD-10-CM | POA: Diagnosis not present

## 2018-06-16 DIAGNOSIS — M25561 Pain in right knee: Secondary | ICD-10-CM | POA: Diagnosis not present

## 2018-06-16 DIAGNOSIS — M19011 Primary osteoarthritis, right shoulder: Secondary | ICD-10-CM | POA: Diagnosis not present

## 2018-06-16 DIAGNOSIS — E119 Type 2 diabetes mellitus without complications: Secondary | ICD-10-CM | POA: Diagnosis not present

## 2018-06-16 DIAGNOSIS — G8929 Other chronic pain: Secondary | ICD-10-CM

## 2018-06-16 DIAGNOSIS — M1389 Other specified arthritis, multiple sites: Secondary | ICD-10-CM | POA: Diagnosis not present

## 2018-06-16 DIAGNOSIS — Z79891 Long term (current) use of opiate analgesic: Secondary | ICD-10-CM | POA: Diagnosis not present

## 2018-06-16 DIAGNOSIS — I10 Essential (primary) hypertension: Secondary | ICD-10-CM | POA: Diagnosis not present

## 2018-06-16 DIAGNOSIS — G894 Chronic pain syndrome: Secondary | ICD-10-CM

## 2018-06-16 DIAGNOSIS — G4730 Sleep apnea, unspecified: Secondary | ICD-10-CM | POA: Diagnosis not present

## 2018-06-16 DIAGNOSIS — M25572 Pain in left ankle and joints of left foot: Secondary | ICD-10-CM | POA: Diagnosis not present

## 2018-06-16 MED ORDER — OXYCODONE HCL 5 MG PO TABS: 5.0000 mg | ORAL_TABLET | Freq: Three times a day (TID) | ORAL | 0 refills | Status: DC | PRN

## 2018-06-16 NOTE — Progress Notes (Signed)
Subjective:    Patient ID: Tanya Owens, female    DOB: Oct 10, 1960, 58 y.o.   MRN: [id number]  HPI: Tanya Owens is a 58 y.o. female who returns for follow up appointment for chronic pain and medication refill. She states her pain is located in her left shoulder and bilateral hands. She rates her pain 6.Her  current exercise regime is walking and performing stretching exercises.  Tanya Owens Morphine equivalent is 22.50  MME. Last UDS was Performed on 05/20/2018, it was consistent.   Pain Inventory Average Pain 6 Pain Right Now 6 My pain is intermittent and aching  In the last 24 hours, has pain interfered with the following? General activity 6 Relation with others 6 Enjoyment of life 6 What TIME of day is your pain at its worst? na Sleep (in general) Fair  Pain is worse with: some activites Pain improves with: medication Relief from Meds: 0  Mobility walk without assistance ability to climb steps?  yes  Function Do you have any goals in this area?  no  Neuro/Psych No problems in this area  Prior Studies Any changes since last visit?  no  Physicians involved in your care Any changes since last visit?  no   Family History  Problem Relation Age of Onset  . Heart attack Mother   . Diabetes Mother   . Hypercholesterolemia Mother   . Hypertension Mother   . Diabetes Sister   . Hypertension Brother    Social History   Socioeconomic History  . Marital status: Single    Spouse name: Not on file  . Number of children: Not on file  . Years of education: Not on file  . Highest education level: Not on file  Occupational History  . Not on file  Social Needs  . Financial resource strain: Not on file  . Food insecurity:    Worry: Not on file    Inability: Not on file  . Transportation needs:    Medical: Not on file    Non-medical: Not on file  Tobacco Use  . Smoking status: Former Smoker    Last attempt to quit: 12/27/2012    Years since quitting: 5.4    . Smokeless tobacco: Never Used  Substance and Sexual Activity  . Alcohol use: No  . Drug use: No  . Sexual activity: Yes    Birth control/protection: Post-menopausal  Lifestyle  . Physical activity:    Days per week: Not on file    Minutes per session: Not on file  . Stress: Not on file  Relationships  . Social connections:    Talks on phone: Not on file    Gets together: Not on file    Attends religious service: Not on file    Active member of club or organization: Not on file    Attends meetings of clubs or organizations: Not on file    Relationship status: Not on file  Other Topics Concern  . Not on file  Social History Narrative  . Not on file   Past Surgical History:  Procedure Laterality Date  . CARDIAC CATHETERIZATION  10/18/2007   Normal coronaries, systemic hypertension   Past Medical History:  Diagnosis Date  . Arthritis 2000  . Diabetes mellitus without complication (HCC) 2005  . Hypercholesteremia 2005  . Hypertension 2005  . Sleep apnea 2010  . Sleep disorder    BP 124/84   Pulse 92   Ht 5\' 9"  (1.753 m)  Wt 245 lb (111.1 kg)   SpO2 98%   BMI 36.18 kg/m   Opioid Risk Score:   Fall Risk Score:  `1  Depression screen PHQ 2/9  [facility] 05/20/2018 03/10/2018 12/13/2017 08/16/2017 07/02/2017 06/02/2017 04/16/2017  Decreased Interest 0 0 0 0 0 0 0  Down, Depressed, Hopeless 0 0 0 0 0 0 0  PHQ - 2 Score 0 0 0 0 0 0 0     Review of Systems  Constitutional: Negative.   HENT: Negative.   Eyes: Negative.   Respiratory: Negative.   Cardiovascular: Negative.   Gastrointestinal: Negative.   Endocrine: Negative.   Genitourinary: Negative.   Musculoskeletal: Positive for arthralgias and myalgias.  Skin: Negative.   Allergic/Immunologic: Negative.   Neurological: Negative.   Hematological: Negative.   Psychiatric/Behavioral: Negative.   All other systems reviewed and are negative.      Objective:   Physical Exam Vitals signs and  nursing note reviewed.  Constitutional:      Appearance: Normal appearance.  Neck:     Musculoskeletal: Normal range of motion and neck supple.  Cardiovascular:     Rate and Rhythm: Normal rate and regular rhythm.     Pulses: Normal pulses.     Heart sounds: Normal heart sounds.  Pulmonary:     Effort: Pulmonary effort is normal.     Breath sounds: Normal breath sounds.  Musculoskeletal:     Comments: Normal Muscle Bulk and Muscle Testing Reveals: pper Extremities: Right: Full ROM and Muscle Strength 5/5 Left: Decreased ROM 90 Degrees and Muscle Strength 5/5 Left AC Joint Tenderness Lower Extremities: Full ROM and Muscle Strength 5/5 Arises from chair with ease Narrow Based Gait    Skin:    General: Skin is warm and dry.  Neurological:     Mental Status: She is alert and oriented to person, place, and time.  Psychiatric:        Mood and Affect: Mood normal.        Behavior: Behavior normal.           Assessment & Plan:  1. 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 06/16/2018 3. Chronic Pain Syndrome:Refilled:Oxycodone 5 mg one tablet three times a  day as needed. #60. We will continue the opioid monitoring program, this consists of regular clinic visits, examinations, urine drug screen, pill counts as well as use [place] Cont[place]. 4. Chronic Left Shoulder Pain: Continue to Alternate Heat and Ice Therapy. Continue HEP as Tolerated. Continue to Monitor .  20minutes of face to face patient care time was spent during this visit. All questions were encouraged and answered.   F/U in 1 month

## 2018-07-13 ENCOUNTER — Encounter: Payer: Self-pay | Admitting: Registered Nurse

## 2018-07-13 ENCOUNTER — Encounter: Payer: Medicaid Other | Attending: Physical Medicine & Rehabilitation | Admitting: Registered Nurse

## 2018-07-13 VITALS — BP 127/85 | HR 82 | Ht 69.0 in | Wt 239.0 lb

## 2018-07-13 DIAGNOSIS — I10 Essential (primary) hypertension: Secondary | ICD-10-CM | POA: Diagnosis not present

## 2018-07-13 DIAGNOSIS — M25562 Pain in left knee: Secondary | ICD-10-CM | POA: Insufficient documentation

## 2018-07-13 DIAGNOSIS — M1389 Other specified arthritis, multiple sites: Secondary | ICD-10-CM | POA: Insufficient documentation

## 2018-07-13 DIAGNOSIS — Z5181 Encounter for therapeutic drug level monitoring: Secondary | ICD-10-CM | POA: Diagnosis not present

## 2018-07-13 DIAGNOSIS — M2550 Pain in unspecified joint: Secondary | ICD-10-CM

## 2018-07-13 DIAGNOSIS — M19011 Primary osteoarthritis, right shoulder: Secondary | ICD-10-CM | POA: Insufficient documentation

## 2018-07-13 DIAGNOSIS — M170 Bilateral primary osteoarthritis of knee: Secondary | ICD-10-CM | POA: Diagnosis not present

## 2018-07-13 DIAGNOSIS — M79641 Pain in right hand: Secondary | ICD-10-CM | POA: Insufficient documentation

## 2018-07-13 DIAGNOSIS — M25511 Pain in right shoulder: Secondary | ICD-10-CM | POA: Diagnosis not present

## 2018-07-13 DIAGNOSIS — M25561 Pain in right knee: Secondary | ICD-10-CM | POA: Diagnosis not present

## 2018-07-13 DIAGNOSIS — G894 Chronic pain syndrome: Secondary | ICD-10-CM

## 2018-07-13 DIAGNOSIS — M79642 Pain in left hand: Secondary | ICD-10-CM | POA: Insufficient documentation

## 2018-07-13 DIAGNOSIS — E119 Type 2 diabetes mellitus without complications: Secondary | ICD-10-CM | POA: Insufficient documentation

## 2018-07-13 DIAGNOSIS — G4730 Sleep apnea, unspecified: Secondary | ICD-10-CM | POA: Insufficient documentation

## 2018-07-13 DIAGNOSIS — Z87891 Personal history of nicotine dependence: Secondary | ICD-10-CM | POA: Insufficient documentation

## 2018-07-13 DIAGNOSIS — E7800 Pure hypercholesterolemia, unspecified: Secondary | ICD-10-CM | POA: Insufficient documentation

## 2018-07-13 DIAGNOSIS — M25571 Pain in right ankle and joints of right foot: Secondary | ICD-10-CM

## 2018-07-13 DIAGNOSIS — M25512 Pain in left shoulder: Secondary | ICD-10-CM

## 2018-07-13 DIAGNOSIS — M25572 Pain in left ankle and joints of left foot: Secondary | ICD-10-CM | POA: Insufficient documentation

## 2018-07-13 DIAGNOSIS — M19042 Primary osteoarthritis, left hand: Secondary | ICD-10-CM

## 2018-07-13 DIAGNOSIS — Z79891 Long term (current) use of opiate analgesic: Secondary | ICD-10-CM | POA: Diagnosis not present

## 2018-07-13 DIAGNOSIS — G8929 Other chronic pain: Secondary | ICD-10-CM

## 2018-07-13 MED ORDER — OXYCODONE HCL 5 MG PO TABS: 5.0000 mg | ORAL_TABLET | Freq: Three times a day (TID) | ORAL | 0 refills | Status: DC | PRN

## 2018-07-13 NOTE — Progress Notes (Signed)
Subjective:    Patient ID: Tanya Owens, female    DOB: May 22, 1960, 58 y.o.   MRN: [id number]  HPI: Tanya Owens is a 58 y.o. female who returns for follow up appointment for chronic pain and medication refill. She states her pain is located in her left hand and bilateral shoulders. She  Rates her pain 10. Her. current exercise regime is walking and performing stretching exercises with bands.   Ms. Morman Morphine equivalent is 22.50 MME. Last UDS was Performed on 05/20/2018, it was consistent.   Pain Inventory Average Pain 9 Pain Right Now 10 My pain is aching  In the last 24 hours, has pain interfered with the following? General activity 9 Relation with others 10 Enjoyment of life 10 What TIME of day is your pain at its worst? night Sleep (in general) Fair  Pain is worse with: sitting Pain improves with: rest Relief from Meds: na  Mobility walk without assistance  Function Do you have any goals in this area?  no  Neuro/Psych No problems in this area  Prior Studies Any changes since last visit?  no  Physicians involved in your care Any changes since last visit?  no   Family History  Problem Relation Age of Onset  . Heart attack Mother   . Diabetes Mother   . Hypercholesterolemia Mother   . Hypertension Mother   . Diabetes Sister   . Hypertension Brother    Social History   Socioeconomic History  . Marital status: Single    Spouse name: Not on file  . Number of children: Not on file  . Years of education: Not on file  . Highest education level: Not on file  Occupational History  . Not on file  Social Needs  . Financial resource strain: Not on file  . Food insecurity:    Worry: Not on file    Inability: Not on file  . Transportation needs:    Medical: Not on file    Non-medical: Not on file  Tobacco Use  . Smoking status: Former Smoker    Last attempt to quit: 12/27/2012    Years since quitting: 5.5  . Smokeless tobacco: Never Used    Substance and Sexual Activity  . Alcohol use: No  . Drug use: No  . Sexual activity: Yes    Birth control/protection: Post-menopausal  Lifestyle  . Physical activity:    Days per week: Not on file    Minutes per session: Not on file  . Stress: Not on file  Relationships  . Social connections:    Talks on phone: Not on file    Gets together: Not on file    Attends religious service: Not on file    Active member of club or organization: Not on file    Attends meetings of clubs or organizations: Not on file    Relationship status: Not on file  Other Topics Concern  . Not on file  Social History Narrative  . Not on file   Past Surgical History:  Procedure Laterality Date  . CARDIAC CATHETERIZATION  10/18/2007   Normal coronaries, systemic hypertension   Past Medical History:  Diagnosis Date  . Arthritis 2000  . Diabetes mellitus without complication (HCC) 2005  . Hypercholesteremia 2005  . Hypertension 2005  . Sleep apnea 2010  . Sleep disorder    There were no vitals taken for this visit.  Opioid Risk Score:   Fall Risk Score:  `1  Depression  screen PHQ 2/9  Depression screen [facility] 2/9 05/20/2018 03/10/2018 12/13/2017 08/16/2017 07/02/2017 06/02/2017 04/16/2017  Decreased Interest 0 0 0 0 0 0 0  Down, Depressed, Hopeless 0 0 0 0 0 0 0  PHQ - 2 Score 0 0 0 0 0 0 0     Review of Systems  Constitutional: Negative.   HENT: Negative.   Eyes: Negative.   Respiratory: Negative.   Cardiovascular: Negative.   Gastrointestinal: Negative.   Endocrine: Negative.   Genitourinary: Negative.   Musculoskeletal: Positive for arthralgias and myalgias.  Skin: Negative.   Allergic/Immunologic: Negative.   Neurological: Negative.   Hematological: Negative.   Psychiatric/Behavioral: Negative.   All other systems reviewed and are negative.      Objective:   Physical Exam Vitals signs and nursing note reviewed.  Constitutional:      Appearance: Normal appearance.  Neck:      Musculoskeletal: Normal range of motion and neck supple.  Cardiovascular:     Rate and Rhythm: Normal rate and regular rhythm.     Pulses: Normal pulses.     Heart sounds: Normal heart sounds.  Pulmonary:     Effort: Pulmonary effort is normal.     Breath sounds: Normal breath sounds.  Musculoskeletal:     Comments: Normal Muscle Bulk and Muscle Testing Reveals:  Upper Extremities: Full ROM and Muscle Strength on Right 5/5 and Left 4/5  Thoracic Paraspinal Tenderness: T-7-T-9 Lower Extremities: Full ROM and Muscle Strength 5/5 Arises from chair with ease Narrow Based Gait   Skin:    General: Skin is warm and dry.  Neurological:     Mental Status: She is alert and oriented to person, place, and time.  Psychiatric:        Mood and Affect: Mood normal.        Behavior: Behavior normal.           Assessment & Plan:  1. 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 Chronic Pain Syndrome:Refilled:Oxycodone 5 mg one tabletthree times aday as needed. #90. We will continue the opioid monitoring program, this consists of regular clinic visits, examinations, urine drug screen, pill counts as well as use of [place] Controlled Substance Reporting system. 4. Chronic BilateralShoulder Pain: Continue to Alternate Heat and Ice Therapy. Continue HEP as Tolerated. Continue to Monitor 5. Left Hand OA: Continue current medication regimen. Continue to Monitor.   of face to face patient care time was spent during this visit. All questions were encouraged and answered.  F/U in 1 month

## 2018-08-04 ENCOUNTER — Inpatient Hospital Stay: Admission: RE | Admit: 2018-08-04 | Payer: Medicaid Other | Source: Ambulatory Visit

## 2018-08-15 ENCOUNTER — Encounter: Payer: Medicaid Other | Attending: Physical Medicine & Rehabilitation | Admitting: Registered Nurse

## 2018-08-15 ENCOUNTER — Encounter: Payer: Self-pay | Admitting: Registered Nurse

## 2018-08-15 ENCOUNTER — Other Ambulatory Visit: Payer: Self-pay

## 2018-08-15 VITALS — BP 142/89 | HR 83 | Ht 69.0 in | Wt 243.0 lb

## 2018-08-15 DIAGNOSIS — G894 Chronic pain syndrome: Secondary | ICD-10-CM

## 2018-08-15 DIAGNOSIS — M170 Bilateral primary osteoarthritis of knee: Secondary | ICD-10-CM | POA: Diagnosis not present

## 2018-08-15 DIAGNOSIS — M25572 Pain in left ankle and joints of left foot: Secondary | ICD-10-CM | POA: Diagnosis not present

## 2018-08-15 DIAGNOSIS — M19011 Primary osteoarthritis, right shoulder: Secondary | ICD-10-CM | POA: Diagnosis not present

## 2018-08-15 DIAGNOSIS — E7800 Pure hypercholesterolemia, unspecified: Secondary | ICD-10-CM | POA: Insufficient documentation

## 2018-08-15 DIAGNOSIS — G4730 Sleep apnea, unspecified: Secondary | ICD-10-CM | POA: Insufficient documentation

## 2018-08-15 DIAGNOSIS — M792 Neuralgia and neuritis, unspecified: Secondary | ICD-10-CM

## 2018-08-15 DIAGNOSIS — M79642 Pain in left hand: Secondary | ICD-10-CM | POA: Diagnosis not present

## 2018-08-15 DIAGNOSIS — I10 Essential (primary) hypertension: Secondary | ICD-10-CM | POA: Diagnosis not present

## 2018-08-15 DIAGNOSIS — M25561 Pain in right knee: Secondary | ICD-10-CM | POA: Insufficient documentation

## 2018-08-15 DIAGNOSIS — M25571 Pain in right ankle and joints of right foot: Secondary | ICD-10-CM | POA: Diagnosis not present

## 2018-08-15 DIAGNOSIS — Z79891 Long term (current) use of opiate analgesic: Secondary | ICD-10-CM | POA: Insufficient documentation

## 2018-08-15 DIAGNOSIS — Z5181 Encounter for therapeutic drug level monitoring: Secondary | ICD-10-CM | POA: Diagnosis not present

## 2018-08-15 DIAGNOSIS — Z87891 Personal history of nicotine dependence: Secondary | ICD-10-CM | POA: Insufficient documentation

## 2018-08-15 DIAGNOSIS — E119 Type 2 diabetes mellitus without complications: Secondary | ICD-10-CM | POA: Insufficient documentation

## 2018-08-15 DIAGNOSIS — M2550 Pain in unspecified joint: Secondary | ICD-10-CM | POA: Diagnosis not present

## 2018-08-15 DIAGNOSIS — M79641 Pain in right hand: Secondary | ICD-10-CM | POA: Insufficient documentation

## 2018-08-15 DIAGNOSIS — M1389 Other specified arthritis, multiple sites: Secondary | ICD-10-CM | POA: Diagnosis present

## 2018-08-15 DIAGNOSIS — M25562 Pain in left knee: Secondary | ICD-10-CM | POA: Insufficient documentation

## 2018-08-15 MED ORDER — OXYCODONE HCL 5 MG PO TABS: 5.0000 mg | ORAL_TABLET | Freq: Three times a day (TID) | ORAL | 0 refills | Status: DC | PRN

## 2018-08-15 MED ORDER — NORTRIPTYLINE HCL 10 MG PO CAPS: 10.0000 mg | ORAL_CAPSULE | Freq: Every day | ORAL | 2 refills | Status: DC

## 2018-08-15 NOTE — Progress Notes (Signed)
Subjective:    Patient ID: Tanya Owens, female    DOB: 1961/03/17, 58 y.o.   MRN: [id number]  HPI: Tanya Owens is a 58 y.o. female who returns for follow up appointment for chronic pain and medication refill. She states she is experiencing increase intensity and frequency of nerve pain mostly at HS in her right hand and left foot, we will prescribe nortriptyline, she verba;izes understanding. Also reports generalized joint pain all over. She rated her  pain today 0. Her current exercise regime is walking and performing stretching exercises.  [name] Morphine equivalent is 22.50 MME.  Last UDS was Performed on 05/20/2018, it was consistent.   Pain Inventory Average Pain 10 Pain Right Now 0 My pain is intermittent and sharp  In the last 24 hours, has pain interfered with the following? General activity 0 Relation with others 0 Enjoyment of life 0 What TIME of day is your pain at its worst? night Sleep (in general) Poor  Pain is worse with: laying Pain improves with: medication Relief from Meds: 5  Mobility how many minutes can you walk? 10 ability to climb steps?  yes do you drive?  yes  Function disabled: date disabled na  Neuro/Psych No problems in this area  Prior Studies Any changes since last visit?  no  Physicians involved in your care Any changes since last visit?  no   Family History  Problem Relation Age of Onset  . Heart attack Mother   . Diabetes Mother   . Hypercholesterolemia Mother   . Hypertension Mother   . Diabetes Sister   . Hypertension Brother    Social History   Socioeconomic History  . Marital status: Single    Spouse name: Not on file  . Number of children: Not on file  . Years of education: Not on file  . Highest education level: Not on file  Occupational History  . Not on file  Social Needs  . Financial resource strain: Not on file  . Food insecurity:    Worry: Not on file    Inability: Not on file  .  Transportation needs:    Medical: Not on file    Non-medical: Not on file  Tobacco Use  . Smoking status: Former Smoker    Last attempt to quit: 12/27/2012    Years since quitting: 5.6  . Smokeless tobacco: Never Used  Substance and Sexual Activity  . Alcohol use: No  . Drug use: No  . Sexual activity: Yes    Birth control/protection: Post-menopausal  Lifestyle  . Physical activity:    Days per week: Not on file    Minutes per session: Not on file  . Stress: Not on file  Relationships  . Social connections:    Talks on phone: Not on file    Gets together: Not on file    Attends religious service: Not on file    Active member of club or organization: Not on file    Attends meetings of clubs or organizations: Not on file    Relationship status: Not on file  Other Topics Concern  . Not on file  Social History Narrative  . Not on file   Past Surgical History:  Procedure Laterality Date  . CARDIAC CATHETERIZATION  10/18/2007   Normal coronaries, systemic hypertension   Past Medical History:  Diagnosis Date  . Arthritis 2000  . Diabetes mellitus without complication (HCC) 2005  . Hypercholesteremia 2005  . Hypertension 2005  .  Sleep apnea 2010  . Sleep disorder    BP (!) 142/89   Pulse 83   Ht 5\' 9"  (1.753 m)   Wt 243 lb (110.2 kg)   SpO2 96%   BMI 35.88 kg/m   Opioid Risk Score:   Fall Risk Score:  `1  Depression screen PHQ 2/9  Depression screen [facility] 2/9 08/15/2018 05/20/2018 03/10/2018 12/13/2017 08/16/2017 07/02/2017 06/02/2017  Decreased Interest 0 0 0 0 0 0 0  Down, Depressed, Hopeless 0 0 0 0 0 0 0  PHQ - 2 Score 0 0 0 0 0 0 0    Review of Systems  Constitutional: Negative.   HENT: Negative.   Eyes: Negative.   Respiratory: Negative.   Cardiovascular: Negative.   Gastrointestinal: Negative.   Endocrine: Negative.   Genitourinary: Negative.   Musculoskeletal: Negative.   Skin: Negative.   Allergic/Immunologic: Negative.   Neurological: Negative.    Hematological: Negative.   Psychiatric/Behavioral: Negative.   All other systems reviewed and are negative.      Objective:   Physical Exam Vitals signs and nursing note reviewed.  Constitutional:      Appearance: Normal appearance.  Neck:     Musculoskeletal: Normal range of motion and neck supple.  Cardiovascular:     Rate and Rhythm: Normal rate and regular rhythm.     Pulses: Normal pulses.     Heart sounds: Normal heart sounds.  Pulmonary:     Effort: Pulmonary effort is normal.     Breath sounds: Normal breath sounds.  Musculoskeletal:     Comments: Normal Muscle Bulk and Muscle Testing Reveals:  Upper Extremities: Full ROM and Muscle Strength 5/5  Lower Extremities: Full ROM and Muscle Strength 5/5 Arises from Table with ease Narrow Based Gait   Skin:    General: Skin is warm and dry.  Neurological:     Mental Status: She is alert and oriented to person, place, and time.  Psychiatric:        Mood and Affect: Mood normal.        Behavior: Behavior normal.           Assessment & Plan:  1. 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 Chronic Pain Syndrome:Refilled:Oxycodone 5 mg one tabletthree times aday as needed. #90.08/15/2018 We will continue the opioid monitoring program, this consists of regular clinic visits, examinations, urine drug screen, pill counts as well as use of [place] Controlled Substance Reporting system. 4. Chronic BilateralShoulder Pain: No complaints today. Continue to Alternate Heat and Ice Therapy. Continue HEP as Tolerated. Continue to Monitor. 08/15/2018 5. Left Hand OA: Continue current medication regimen. Continue to Monitor. 08/15/2018 6. Neuropathic pain Left Hand and Left Foot: RX: Pamelor.   of face to face patient care time was spent during this visit. All questions were encouraged and answered.  F/U in 1  month

## 2018-08-25 ENCOUNTER — Telehealth: Payer: Self-pay

## 2018-08-25 NOTE — Telephone Encounter (Signed)
Patient called stating she was to call Tanya Owens in 2 weeks about new medication. Medication is not working.

## 2018-08-25 NOTE — Telephone Encounter (Signed)
Return Ms. Keplinger call, we will increase her Pamelor to two capsules ( 20 mg) at [facility], she was instructed to call office on Monday for Medication evaluation, she verbalizes understanding.

## 2018-09-15 ENCOUNTER — Encounter: Payer: Self-pay | Admitting: Registered Nurse

## 2018-09-15 ENCOUNTER — Other Ambulatory Visit: Payer: Self-pay

## 2018-09-15 ENCOUNTER — Encounter: Payer: Medicaid Other | Attending: Physical Medicine & Rehabilitation | Admitting: Registered Nurse

## 2018-09-15 VITALS — Ht 69.0 in | Wt 243.0 lb

## 2018-09-15 DIAGNOSIS — E119 Type 2 diabetes mellitus without complications: Secondary | ICD-10-CM | POA: Insufficient documentation

## 2018-09-15 DIAGNOSIS — E7800 Pure hypercholesterolemia, unspecified: Secondary | ICD-10-CM | POA: Insufficient documentation

## 2018-09-15 DIAGNOSIS — M19042 Primary osteoarthritis, left hand: Secondary | ICD-10-CM

## 2018-09-15 DIAGNOSIS — I10 Essential (primary) hypertension: Secondary | ICD-10-CM | POA: Insufficient documentation

## 2018-09-15 DIAGNOSIS — M25572 Pain in left ankle and joints of left foot: Secondary | ICD-10-CM | POA: Insufficient documentation

## 2018-09-15 DIAGNOSIS — M19041 Primary osteoarthritis, right hand: Secondary | ICD-10-CM

## 2018-09-15 DIAGNOSIS — M0609 Rheumatoid arthritis without rheumatoid factor, multiple sites: Secondary | ICD-10-CM

## 2018-09-15 DIAGNOSIS — Z79891 Long term (current) use of opiate analgesic: Secondary | ICD-10-CM

## 2018-09-15 DIAGNOSIS — G894 Chronic pain syndrome: Secondary | ICD-10-CM

## 2018-09-15 DIAGNOSIS — M25561 Pain in right knee: Secondary | ICD-10-CM | POA: Insufficient documentation

## 2018-09-15 DIAGNOSIS — M1389 Other specified arthritis, multiple sites: Secondary | ICD-10-CM

## 2018-09-15 DIAGNOSIS — M19011 Primary osteoarthritis, right shoulder: Secondary | ICD-10-CM | POA: Insufficient documentation

## 2018-09-15 DIAGNOSIS — Z5181 Encounter for therapeutic drug level monitoring: Secondary | ICD-10-CM

## 2018-09-15 DIAGNOSIS — Z87891 Personal history of nicotine dependence: Secondary | ICD-10-CM | POA: Insufficient documentation

## 2018-09-15 DIAGNOSIS — M79641 Pain in right hand: Secondary | ICD-10-CM | POA: Insufficient documentation

## 2018-09-15 DIAGNOSIS — M2550 Pain in unspecified joint: Secondary | ICD-10-CM

## 2018-09-15 DIAGNOSIS — M25562 Pain in left knee: Secondary | ICD-10-CM | POA: Insufficient documentation

## 2018-09-15 DIAGNOSIS — M25571 Pain in right ankle and joints of right foot: Secondary | ICD-10-CM | POA: Insufficient documentation

## 2018-09-15 DIAGNOSIS — M79642 Pain in left hand: Secondary | ICD-10-CM | POA: Insufficient documentation

## 2018-09-15 DIAGNOSIS — G4730 Sleep apnea, unspecified: Secondary | ICD-10-CM | POA: Insufficient documentation

## 2018-09-15 MED ORDER — METHYLPREDNISOLONE 4 MG PO TBPK: ORAL_TABLET | ORAL | 0 refills | Status: DC

## 2018-09-15 MED ORDER — OXYCODONE HCL 5 MG PO TABS: 5.0000 mg | ORAL_TABLET | Freq: Three times a day (TID) | ORAL | 0 refills | Status: DC | PRN

## 2018-09-15 NOTE — Progress Notes (Signed)
Subjective:    Patient ID: Tanya Owens, female    DOB: 1960/06/17, 58 y.o.   MRN: [id number]  HPI: Tanya Owens is a 58 y.o. female her appointment was changed, due to national recommendations of social distancing due to COVID 19, an audio/video telehealth visit is felt to be most appropriate for this patient at this time.  See Chart message from today for the patient's consent to telehealth from [facility].    She  states her pain is located in her bilateral hands R>L reports increase intensity of right wrist pain and swelling, also reports she has  right knee pain and right lower extremity pain. Tanya Owens reports the other day she took her oxycodone 4 times a day when she was experiencing increase intensity of right hand pain, narcotic contract reviewed. She was instructed not to self medicate and call office with any changes in her pain, she verbalizes understanding. She is aware  this can lead to discharge from our office, she verbalizes understanding.We will prescribe a medrol dose pak, also instructed to F/U with her Rheumatologist, she verbalizes understanding.  She rates her pain 10. Her .current exercise regime is walking and performing stretching exercises with bands.   Tanya Owens Morphine equivalent is 22.50 MME.  Last UDS was Performed on 05/20/2018, it was consistent.   Tanya Owens CMA asked the Health and History Questions. This provider and Tanya Owens verified we were speaking with the correct person using two identifiers.   Pain Inventory Average Pain 10 Pain Right Now 10 My pain is constant, sharp and aching  In the last 24 hours, has pain interfered with the following? General activity 10 Relation with others 10 Enjoyment of life 10 What TIME of day is your pain at its worst? night Sleep (in general) Poor  Pain is worse with: laying Pain improves with: medication Relief from Meds: 5  Mobility how many minutes can you  walk? 10 ability to climb steps?  yes do you drive?  yes  Function disabled: date disabled  na  Neuro/Psych trouble walking dizziness  Prior Studies Any changes since last visit?  no  Physicians involved in your care Any changes since last visit?  no   Family History  Problem Relation Age of Onset  . Heart attack Mother   . Diabetes Mother   . Hypercholesterolemia Mother   . Hypertension Mother   . Diabetes Sister   . Hypertension Brother    Social History   Socioeconomic History  . Marital status: Single    Spouse name: Not on file  . Number of children: Not on file  . Years of education: Not on file  . Highest education level: Not on file  Occupational History  . Not on file  Social Needs  . Financial resource strain: Not on file  . Food insecurity:    Worry: Not on file    Inability: Not on file  . Transportation needs:    Medical: Not on file    Non-medical: Not on file  Tobacco Use  . Smoking status: Former Smoker    Last attempt to quit: 12/27/2012    Years since quitting: 5.7  . Smokeless tobacco: Never Used  Substance and Sexual Activity  . Alcohol use: No  . Drug use: No  . Sexual activity: Yes    Birth control/protection: Post-menopausal  Lifestyle  . Physical activity:    Days per week: Not on file  Minutes per session: Not on file  . Stress: Not on file  Relationships  . Social connections:    Talks on phone: Not on file    Gets together: Not on file    Attends religious service: Not on file    Active member of club or organization: Not on file    Attends meetings of clubs or organizations: Not on file    Relationship status: Not on file  Other Topics Concern  . Not on file  Social History Narrative  . Not on file   Past Surgical History:  Procedure Laterality Date  . CARDIAC CATHETERIZATION  10/18/2007   Normal coronaries, systemic hypertension   Past Medical History:  Diagnosis Date  . Arthritis 2000  . Diabetes mellitus  without complication (HCC) 2005  . Hypercholesteremia 2005  . Hypertension 2005  . Sleep apnea 2010  . Sleep disorder    Ht 5\' 9"  (1.753 m)   Wt 243 lb (110.2 kg)   BMI 35.88 kg/m   Opioid Risk Score:   Fall Risk Score:  `1  Depression screen PHQ 2/9  Depression screen [facility] 2/9 08/15/2018 05/20/2018 03/10/2018 12/13/2017 08/16/2017 07/02/2017 06/02/2017  Decreased Interest 0 0 0 0 0 0 0  Down, Depressed, Hopeless 0 0 0 0 0 0 0  PHQ - 2 Score 0 0 0 0 0 0 0    Review of Systems  Constitutional: Negative.   HENT: Negative.   Eyes: Negative.   Respiratory: Negative.   Cardiovascular: Negative.   Gastrointestinal: Negative.   Endocrine: Negative.   Genitourinary: Negative.   Musculoskeletal: Positive for arthralgias and joint swelling.       Hand pain Foot pain Leg pain  Skin: Negative.   Allergic/Immunologic: Negative.   Neurological: Negative.   Hematological: Negative.   Psychiatric/Behavioral: Negative.   All other systems reviewed and are negative.      Objective:   Physical Exam Vitals signs and nursing note reviewed.  Musculoskeletal:     Comments: No Physical Exam Perform: Virtual Visit  Neurological:     Mental Status: She is oriented to person, place, and time.           Assessment & Plan:  1. 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 Chronic Pain Syndrome:Refilled:Oxycodone 5 mg one tabletthree times aday as needed. #90.09/15/2018 We will continue the opioid monitoring program, this consists of regular clinic visits, examinations, urine drug screen, pill counts as well as use of [place] Controlled Substance Reporting system. 4. ChronicBilateralShoulder Pain: No complaints today. Continue to Alternate Heat and Ice Therapy. Continue HEP as Tolerated. Continue to Monitor. 09/15/2018 5. Bilateral  Hands OA R>L: RX: Medrol Dose Pak. Instructed to  F/U with her Rheumatologist, she verbalizes understanding. : Continue current medication regimen. Continue to Monitor.09/15/2018 6. Neuropathic pain Left Hand and Left Foot: Continue  Pamelor.    F/U in 1 month   [employer] Location of patient: In her Home Location of provider: Office Established patient Time spent on call: 15 minutes

## 2018-09-22 ENCOUNTER — Encounter: Payer: Self-pay | Admitting: Family Medicine

## 2018-09-22 ENCOUNTER — Ambulatory Visit (INDEPENDENT_AMBULATORY_CARE_PROVIDER_SITE_OTHER): Payer: Medicaid Other | Admitting: Family Medicine

## 2018-09-22 ENCOUNTER — Other Ambulatory Visit: Payer: Self-pay

## 2018-09-22 VITALS — BP 130/74 | HR 84 | Ht 69.0 in | Wt 237.4 lb

## 2018-09-22 DIAGNOSIS — M1389 Other specified arthritis, multiple sites: Secondary | ICD-10-CM

## 2018-09-22 DIAGNOSIS — E119 Type 2 diabetes mellitus without complications: Secondary | ICD-10-CM

## 2018-09-22 DIAGNOSIS — E782 Mixed hyperlipidemia: Secondary | ICD-10-CM

## 2018-09-22 DIAGNOSIS — I10 Essential (primary) hypertension: Secondary | ICD-10-CM | POA: Diagnosis not present

## 2018-09-22 DIAGNOSIS — M0609 Rheumatoid arthritis without rheumatoid factor, multiple sites: Secondary | ICD-10-CM

## 2018-09-22 DIAGNOSIS — Z23 Encounter for immunization: Secondary | ICD-10-CM

## 2018-09-22 LAB — POCT GLYCOSYLATED HEMOGLOBIN (HGB A1C): HbA1c, POC (controlled diabetic range): 6.7 % (ref 0.0–7.0)

## 2018-09-22 MED ORDER — METFORMIN HCL 1000 MG PO TABS: 1000.0000 mg | ORAL_TABLET | Freq: Two times a day (BID) | ORAL | 1 refills | Status: DC

## 2018-09-22 MED ORDER — ATORVASTATIN CALCIUM 40 MG PO TABS: ORAL_TABLET | ORAL | 1 refills | Status: DC

## 2018-09-22 MED ORDER — AMLODIPINE BESYLATE 10 MG PO TABS: 10.0000 mg | ORAL_TABLET | Freq: Every day | ORAL | 1 refills | Status: DC

## 2018-09-22 MED ORDER — HYDROCHLOROTHIAZIDE 25 MG PO TABS: 25.0000 mg | ORAL_TABLET | Freq: Every day | ORAL | 1 refills | Status: DC

## 2018-09-22 MED ORDER — LISINOPRIL 40 MG PO TABS: 40.0000 mg | ORAL_TABLET | Freq: Every day | ORAL | 3 refills | Status: DC

## 2018-09-22 NOTE — Progress Notes (Signed)
a1c

## 2018-09-22 NOTE — Assessment & Plan Note (Signed)
Controlled.  Continue current management of Norvasc 10 mg daily, HCTZ 25 mg daily, lisinopril 40 mg daily.  She has recent blood work from March and shows her electrolytes and kidney function are within normal limits.

## 2018-09-22 NOTE — Patient Instructions (Addendum)
We will check your a1c and vitaminB12 level   We are checking some labs today. If results require attention, either myself or my nurse will get in touch with you. If everything is normal, you will get a letter in the mail or a message in My Chart. Please give [place] a call if you do not hear from [place] after 2 weeks.   Come back and see [place] in 6 months.

## 2018-09-22 NOTE — Assessment & Plan Note (Signed)
Followed by rheumatology.  Her last visit was on March.  She is on Enbrel and methotrexate as well as folic acid.  Advised patient to call her rheumatologist for refills.  She has had recent blood work.  Per chart review her next appointment with them is in June.

## 2018-09-22 NOTE — Progress Notes (Signed)
Subjective:  Tanya Owens is a 58 y.o. female who [facility] for diabetes follow up  HPI:  DIABETES   Medications: metformin 1000mg  BID, tolerating well Compliance- good  Disease Monitoring does not check sugars at home Hypoglycemic symptoms- no  Polyuria- no New Visual problems- no   HYPERTENSION  Medications: amlodipine 10mg  qd, HCTZ 25 mg daily, lisinopril 40 mg daily Compliance-good, tolerating well  Lightheadedness- no   Edema- no  Chest pain- no      Dyspnea- no   HYPERLIPIDEMIA  Disease Monitoring  See symptoms for Hypertension  Medications: atorvastatin 40mg  qd  Compliance- fair RUQ pain- no  Muscle aches- no    Rheumatoid arthritis She continues to [place] [facility] [place].  She last saw them in March.  She wonders if she needs repeat blood work today.  She needs refills on her methotrexate she states that she is out of refills.  ROS: Per HPI  Social Hx: She reports that she quit smoking about 5 years ago. She has never used smokeless tobacco. She reports that she does not drink alcohol or use drugs.   Objective:  Physical Exam: BP 130/74   Pulse 84   Ht 5\' 9"  (1.753 m)   Wt 237 lb 6 oz (107.7 kg)   SpO2 99%   BMI 35.05 kg/m   Gen: NAD, resting comfortably CV: RRR with no murmurs appreciated Pulm: NWOB, CTAB with no crackles, wheezes, or rhonchi GI: Normal bowel sounds present. Soft, Nontender, Nondistended. MSK: no edema, cyanosis, or clubbing noted.  Foot exam normal bilaterally intact monofilament without skin breakdown Skin: warm, dry.  Neuro: grossly normal, moves all extremities Psych: Normal affect and thought content  Results for orders placed or performed in visit on 09/22/18 (from the past 72 hour(s))  HgB A1c     Status: None   Collection Time: 09/22/18  9:10 AM  Result Value Ref Range   Hemoglobin A1C     HbA1c POC (<> result, manual entry)     HbA1c, POC (prediabetic range)     HbA1c, POC  (controlled diabetic range) 6.7 0.0 - 7.0 %     Assessment/Plan:  Type 2 diabetes mellitus without complication Doing well on metformin 1000 mg twice daily.  Continue current management.  Check A1c today.  Patient is never had vitamins B12 level checked in the past although she does not have any symptoms of peripheral neuropathy we will get a baseline level today.  Patient will also receive PSV23 today.  HTN (hypertension) Controlled.  Continue current management of Norvasc 10 mg daily, HCTZ 25 mg daily, lisinopril 40 mg daily.  She has recent blood work from March and shows her electrolytes and kidney function are within normal limits.  Mixed hyperlipidemia Continue atorvastatin 40 mg daily  Seronegative arthropathy of multiple sites Followed by rheumatology.  Her last visit was on March.  She is on Enbrel and methotrexate as well as folic acid.  Advised patient to call her rheumatologist for refills.  She has had recent blood work.  Per chart review her next appointment with them is in June.    Orders Placed This Encounter  Procedures  . HgB A1c    Meds ordered this encounter  Medications  . atorvastatin (LIPITOR) 40 MG tablet    Sig: take 1 tablet by mouth once daily    Dispense:  90 tablet    Refill:  1  . metFORMIN (GLUCOPHAGE) 1000 MG  tablet    Sig: Take 1 tablet (1,000 mg total) by mouth 2 (two) times daily with a meal.    Dispense:  180 tablet    Refill:  1  . hydrochlorothiazide (HYDRODIURIL) 25 MG tablet    Sig: Take 1 tablet (25 mg total) by mouth daily.    Dispense:  90 tablet    Refill:  1  . lisinopril (ZESTRIL) 40 MG tablet    Sig: Take 1 tablet (40 mg total) by mouth daily.    Dispense:  90 tablet    Refill:  3  . amLODipine (NORVASC) 10 MG tablet    Sig: Take 1 tablet (10 mg total) by mouth daily.    Dispense:  90 tablet    Refill:  1    Leland Her, DO PGY-3, [facility] 09/22/2018 8:45 AM

## 2018-09-22 NOTE — Assessment & Plan Note (Signed)
Continue atorvastatin 40 mg daily.  

## 2018-09-22 NOTE — Assessment & Plan Note (Addendum)
Doing well on metformin 1000 mg twice daily.  Continue current management.  Check A1c today.  Patient is never had vitamins B12 level checked in the past although she does not have any symptoms of peripheral neuropathy we will get a baseline level today.  Patient will also receive PSV23 today.

## 2018-09-23 LAB — VITAMIN B12: Vitamin B-12: 330 pg/mL (ref 232–1245)

## 2018-10-14 ENCOUNTER — Encounter: Payer: Medicaid Other | Attending: Physical Medicine & Rehabilitation | Admitting: Registered Nurse

## 2018-10-14 ENCOUNTER — Encounter: Payer: Self-pay | Admitting: Registered Nurse

## 2018-10-14 ENCOUNTER — Other Ambulatory Visit: Payer: Self-pay

## 2018-10-14 VITALS — BP 139/84 | Ht 69.0 in | Wt 232.0 lb

## 2018-10-14 DIAGNOSIS — M79641 Pain in right hand: Secondary | ICD-10-CM | POA: Insufficient documentation

## 2018-10-14 DIAGNOSIS — M19011 Primary osteoarthritis, right shoulder: Secondary | ICD-10-CM | POA: Insufficient documentation

## 2018-10-14 DIAGNOSIS — G894 Chronic pain syndrome: Secondary | ICD-10-CM | POA: Diagnosis not present

## 2018-10-14 DIAGNOSIS — M25572 Pain in left ankle and joints of left foot: Secondary | ICD-10-CM | POA: Insufficient documentation

## 2018-10-14 DIAGNOSIS — M19042 Primary osteoarthritis, left hand: Secondary | ICD-10-CM | POA: Diagnosis not present

## 2018-10-14 DIAGNOSIS — E119 Type 2 diabetes mellitus without complications: Secondary | ICD-10-CM | POA: Insufficient documentation

## 2018-10-14 DIAGNOSIS — M0609 Rheumatoid arthritis without rheumatoid factor, multiple sites: Secondary | ICD-10-CM

## 2018-10-14 DIAGNOSIS — M1389 Other specified arthritis, multiple sites: Secondary | ICD-10-CM | POA: Insufficient documentation

## 2018-10-14 DIAGNOSIS — M25562 Pain in left knee: Secondary | ICD-10-CM | POA: Insufficient documentation

## 2018-10-14 DIAGNOSIS — M25571 Pain in right ankle and joints of right foot: Secondary | ICD-10-CM | POA: Insufficient documentation

## 2018-10-14 DIAGNOSIS — M25561 Pain in right knee: Secondary | ICD-10-CM | POA: Insufficient documentation

## 2018-10-14 DIAGNOSIS — E7800 Pure hypercholesterolemia, unspecified: Secondary | ICD-10-CM | POA: Insufficient documentation

## 2018-10-14 DIAGNOSIS — Z5181 Encounter for therapeutic drug level monitoring: Secondary | ICD-10-CM | POA: Diagnosis not present

## 2018-10-14 DIAGNOSIS — Z79891 Long term (current) use of opiate analgesic: Secondary | ICD-10-CM | POA: Diagnosis not present

## 2018-10-14 DIAGNOSIS — M2550 Pain in unspecified joint: Secondary | ICD-10-CM | POA: Diagnosis not present

## 2018-10-14 DIAGNOSIS — I10 Essential (primary) hypertension: Secondary | ICD-10-CM | POA: Insufficient documentation

## 2018-10-14 DIAGNOSIS — G4730 Sleep apnea, unspecified: Secondary | ICD-10-CM | POA: Insufficient documentation

## 2018-10-14 DIAGNOSIS — M79642 Pain in left hand: Secondary | ICD-10-CM | POA: Insufficient documentation

## 2018-10-14 DIAGNOSIS — Z87891 Personal history of nicotine dependence: Secondary | ICD-10-CM | POA: Insufficient documentation

## 2018-10-14 MED ORDER — OXYCODONE HCL 5 MG PO TABS: 5.0000 mg | ORAL_TABLET | Freq: Three times a day (TID) | ORAL | 0 refills | Status: DC | PRN

## 2018-10-14 NOTE — Progress Notes (Signed)
Subjective:    Patient ID: Tanya Owens, female    DOB: 03-Jun-1960, 58 y.o.   MRN: [id number]  HPI: Tanya Owens is a 58 y.o. female her appointment was changed, due to national recommendations of social distancing due to COVID 19, an audio/video telehealth visit is felt to be most appropriate for this patient at this time.  See Chart message from today for the patient's consent to [facility].    She states her pain is located in her left hand. She rates her pain 6. Her current exercise regime is walking.  Ms. Nelly RoutDawkins Morphine equivalent is 22.50 MME. Last UDS was Performed on 05/20/2018, it was consistent.   Angela NevinKenneth Wessling CMA asked the Health and History Questions. This provider and Purvis SheffieldKen Wessling  verified we were  speaking with the correct person using two identifiers.  Pain Inventory Average Pain 6 Pain Right Now 6 My pain is intermittent and aching  In the last 24 hours, has pain interfered with the following? General activity 6 Relation with others 0 Enjoyment of life 7 What TIME of day is your pain at its worst? night Sleep (in general) Fair  Pain is worse with: unsure Pain improves with: rest and medication Relief from Meds: 8  Mobility walk without assistance how many minutes can you walk? 10 ability to climb steps?  yes do you drive?  yes  Function disabled: date disabled .  Neuro/Psych No problems in this area  Prior Studies Any changes since last visit?  no  Physicians involved in your care Any changes since last visit?  no   Family History  Problem Relation Age of Onset  . Heart attack Mother   . Diabetes Mother   . Hypercholesterolemia Mother   . Hypertension Mother   . Diabetes Sister   . Hypertension Brother    Social History   Socioeconomic History  . Marital status: Single    Spouse name: Not on file  . Number of children: Not on file  . Years of education: Not on file  .  Highest education level: Not on file  Occupational History  . Not on file  Social Needs  . Financial resource strain: Not on file  . Food insecurity:    Worry: Not on file    Inability: Not on file  . Transportation needs:    Medical: Not on file    Non-medical: Not on file  Tobacco Use  . Smoking status: Former Smoker    Last attempt to quit: 12/27/2012    Years since quitting: 5.8  . Smokeless tobacco: Never Used  Substance and Sexual Activity  . Alcohol use: No  . Drug use: No  . Sexual activity: Yes    Birth control/protection: Post-menopausal  Lifestyle  . Physical activity:    Days per week: Not on file    Minutes per session: Not on file  . Stress: Not on file  Relationships  . Social connections:    Talks on phone: Not on file    Gets together: Not on file    Attends religious service: Not on file    Active member of club or organization: Not on file    Attends meetings of clubs or organizations: Not on file    Relationship status: Not on file  Other Topics Concern  . Not on file  Social History Narrative  . Not on file   Past Surgical History:  Procedure Laterality Date  .  CARDIAC CATHETERIZATION  10/18/2007   Normal coronaries, systemic hypertension   Past Medical History:  Diagnosis Date  . Arthritis 2000  . Diabetes mellitus without complication (HCC) 2005  . Hypercholesteremia 2005  . Hypertension 2005  . Sleep apnea 2010  . Sleep disorder    There were no vitals taken for this visit.  Opioid Risk Score:   Fall Risk Score:  `1  Depression screen PHQ 2/9  Depression screen [facility] 2/9 09/22/2018 08/15/2018 05/20/2018 03/10/2018 12/13/2017 08/16/2017 07/02/2017  Decreased Interest 0 0 0 0 0 0 0  Down, Depressed, Hopeless 0 0 0 0 0 0 0  PHQ - 2 Score 0 0 0 0 0 0 0    Review of Systems  Constitutional: Negative.   HENT: Negative.   Eyes: Negative.   Respiratory: Negative.   Cardiovascular: Negative.   Gastrointestinal: Negative.   Endocrine: Negative.    Genitourinary: Negative.   Musculoskeletal: Positive for arthralgias.  Skin: Negative.   Allergic/Immunologic: Negative.   Neurological: Negative.   Hematological: Negative.   Psychiatric/Behavioral: Negative.   All other systems reviewed and are negative.      Objective:   Physical Exam Vitals signs and nursing note reviewed.  Musculoskeletal:     Comments: No Physical Exam Performed: Virtual Visit  Neurological:     Mental Status: She is oriented to person, place, and time.           Assessment & Plan:  1. 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 Chronic Pain Syndrome:Refilled:Oxycodone 5 mg one tabletthree times aday as needed. #90.10/14/2018 We will continue the opioid monitoring program, this consists of regular clinic visits, examinations, urine drug screen, pill counts as well as use of [place] Controlled Substance Reporting system. 4. ChronicBilateralShoulder Pain:No complaints today.Continue to Alternate Heat and Ice Therapy. Continue HEP as Tolerated. Continue to Monitor. 10/14/2018 5. Bilateral  Hands OA R>L: Continue current medication regimen. Continue to Monitor.10/14/2018 6. Neuropathic pain Left Hand and Left Foot: Continue  Pamelor.10/14/2018  Telephone Call  Location of patient: In her Home Location of provider: Office Established patient Time spent on call: 10 Minutes

## 2018-10-21 ENCOUNTER — Ambulatory Visit: Payer: Medicaid Other

## 2018-10-21 ENCOUNTER — Other Ambulatory Visit: Payer: Self-pay

## 2018-10-21 ENCOUNTER — Ambulatory Visit
Admission: RE | Admit: 2018-10-21 | Discharge: 2018-10-21 | Disposition: A | Discharge status: Home / Self Care | Payer: Medicaid Other | Source: Ambulatory Visit | Attending: Family Medicine | Admitting: Family Medicine

## 2018-10-21 DIAGNOSIS — Z1231 Encounter for screening mammogram for malignant neoplasm of breast: Secondary | ICD-10-CM | POA: Diagnosis not present

## 2018-10-21 DIAGNOSIS — Z1239 Encounter for other screening for malignant neoplasm of breast: Secondary | ICD-10-CM

## 2018-11-07 DIAGNOSIS — M0579 Rheumatoid arthritis with rheumatoid factor of multiple sites without organ or systems involvement: Secondary | ICD-10-CM | POA: Diagnosis not present

## 2018-11-07 DIAGNOSIS — Z79899 Other long term (current) drug therapy: Secondary | ICD-10-CM | POA: Diagnosis not present

## 2018-11-15 ENCOUNTER — Other Ambulatory Visit: Payer: Self-pay

## 2018-11-15 ENCOUNTER — Encounter: Payer: Medicaid Other | Admitting: Registered Nurse

## 2018-11-15 ENCOUNTER — Encounter: Payer: Medicaid Other | Attending: Physical Medicine & Rehabilitation | Admitting: Registered Nurse

## 2018-11-15 ENCOUNTER — Encounter: Payer: Self-pay | Admitting: Registered Nurse

## 2018-11-15 VITALS — BP 131/82 | HR 76 | Temp 97.7°F | Ht 69.0 in

## 2018-11-15 DIAGNOSIS — M25511 Pain in right shoulder: Secondary | ICD-10-CM

## 2018-11-15 DIAGNOSIS — M25561 Pain in right knee: Secondary | ICD-10-CM | POA: Diagnosis not present

## 2018-11-15 DIAGNOSIS — Z5181 Encounter for therapeutic drug level monitoring: Secondary | ICD-10-CM | POA: Insufficient documentation

## 2018-11-15 DIAGNOSIS — M25571 Pain in right ankle and joints of right foot: Secondary | ICD-10-CM | POA: Insufficient documentation

## 2018-11-15 DIAGNOSIS — G4730 Sleep apnea, unspecified: Secondary | ICD-10-CM | POA: Insufficient documentation

## 2018-11-15 DIAGNOSIS — G894 Chronic pain syndrome: Secondary | ICD-10-CM

## 2018-11-15 DIAGNOSIS — M0609 Rheumatoid arthritis without rheumatoid factor, multiple sites: Secondary | ICD-10-CM

## 2018-11-15 DIAGNOSIS — M19042 Primary osteoarthritis, left hand: Secondary | ICD-10-CM

## 2018-11-15 DIAGNOSIS — M25512 Pain in left shoulder: Secondary | ICD-10-CM | POA: Diagnosis not present

## 2018-11-15 DIAGNOSIS — M2550 Pain in unspecified joint: Secondary | ICD-10-CM

## 2018-11-15 DIAGNOSIS — M1389 Other specified arthritis, multiple sites: Secondary | ICD-10-CM

## 2018-11-15 DIAGNOSIS — Z87891 Personal history of nicotine dependence: Secondary | ICD-10-CM | POA: Diagnosis not present

## 2018-11-15 DIAGNOSIS — M19041 Primary osteoarthritis, right hand: Secondary | ICD-10-CM | POA: Diagnosis not present

## 2018-11-15 DIAGNOSIS — M79641 Pain in right hand: Secondary | ICD-10-CM | POA: Insufficient documentation

## 2018-11-15 DIAGNOSIS — E7800 Pure hypercholesterolemia, unspecified: Secondary | ICD-10-CM | POA: Insufficient documentation

## 2018-11-15 DIAGNOSIS — M19011 Primary osteoarthritis, right shoulder: Secondary | ICD-10-CM | POA: Insufficient documentation

## 2018-11-15 DIAGNOSIS — G8929 Other chronic pain: Secondary | ICD-10-CM

## 2018-11-15 DIAGNOSIS — M170 Bilateral primary osteoarthritis of knee: Secondary | ICD-10-CM

## 2018-11-15 DIAGNOSIS — E119 Type 2 diabetes mellitus without complications: Secondary | ICD-10-CM | POA: Insufficient documentation

## 2018-11-15 DIAGNOSIS — M25572 Pain in left ankle and joints of left foot: Secondary | ICD-10-CM | POA: Insufficient documentation

## 2018-11-15 DIAGNOSIS — Z79891 Long term (current) use of opiate analgesic: Secondary | ICD-10-CM | POA: Diagnosis not present

## 2018-11-15 DIAGNOSIS — I10 Essential (primary) hypertension: Secondary | ICD-10-CM | POA: Diagnosis not present

## 2018-11-15 DIAGNOSIS — M79642 Pain in left hand: Secondary | ICD-10-CM | POA: Insufficient documentation

## 2018-11-15 DIAGNOSIS — M25562 Pain in left knee: Secondary | ICD-10-CM | POA: Insufficient documentation

## 2018-11-15 MED ORDER — OXYCODONE HCL 5 MG PO TABS: 5.0000 mg | ORAL_TABLET | Freq: Three times a day (TID) | ORAL | 0 refills | Status: DC | PRN

## 2018-11-15 NOTE — Progress Notes (Signed)
Subjective:    Patient ID: Tanya Owens, female    DOB: [birth year], 58 y.o.   MRN: [id number]  HPI: Tanya Owens is a 58 y.o. female who returns for follow up appointment for chronic pain and medication refill. She states her pain is located in her bilateral hands, bilateral shoulders, mid- back, bilateral knees and bilateral feet pain. Also reports joint pain all over. Tanya Owens reports increase intensity and frequency of joint pain, she ran out of her methotrexate she states, she seen the PA at rheumatology on 11/07/2018, he reordered her methotrexate. She hasn't been able to get the medication, placed a call to her pharmacy today, they have the prescription and will fill it for her, she verbalizes understanding.  She rates her pain 10. Her current exercise regime is walking and performing stretching exercises.  Tanya Owens Morphine equivalent is 22.50  MME. Last UDS was Performed on 05/20/2018, it was consistent.    Pain Inventory Average Pain 10 Pain Right Now 10 My pain is n/a  In the last 24 hours, has pain interfered with the following? General activity n/a Relation with others n/a Enjoyment of life n/a What TIME of day is your pain at its worst? morning,daytime Sleep (in general) n/a  Pain is worse with: n/a Pain improves with: n/a Relief from Meds: n/a  Mobility n/a  Function Do you have any goals in this area?  no  Neuro/Psych n/a  Prior Studies n/a  Physicians involved in your care n/a   Family History  Problem Relation Age of Onset  . Heart attack Mother   . Diabetes Mother   . Hypercholesterolemia Mother   . Hypertension Mother   . Diabetes Sister   . Hypertension Brother    Social History   Socioeconomic History  . Marital status: Single    Spouse name: Not on file  . Number of children: Not on file  . Years of education: Not on file  . Highest education level: Not on file  Occupational History  . Not on file  Social Needs  .  Financial resource strain: Not on file  . Food insecurity    Worry: Not on file    Inability: Not on file  . Transportation needs    Medical: Not on file    Non-medical: Not on file  Tobacco Use  . Smoking status: Former Smoker    Quit date: 12/27/2012    Years since quitting: 5.8  . Smokeless tobacco: Never Used  Substance and Sexual Activity  . Alcohol use: No  . Drug use: No  . Sexual activity: Yes    Birth control/protection: Post-menopausal  Lifestyle  . Physical activity    Days per week: Not on file    Minutes per session: Not on file  . Stress: Not on file  Relationships  . Social Herbalist on phone: Not on file    Gets together: Not on file    Attends religious service: Not on file    Active member of club or organization: Not on file    Attends meetings of clubs or organizations: Not on file    Relationship status: Not on file  Other Topics Concern  . Not on file  Social History Narrative  . Not on file   Past Surgical History:  Procedure Laterality Date  . CARDIAC CATHETERIZATION  10/18/2007   Normal coronaries, systemic hypertension   Past Medical History:  Diagnosis Date  . Arthritis 2000  .  Diabetes mellitus without complication (HCC) 2005  . Hypercholesteremia 2005  . Hypertension 2005  . Sleep apnea 2010  . Sleep disorder    Temp 97.7 F (36.5 C)   Ht 5\' 9"  (1.753 m)   BMI 34.26 kg/m   Opioid Risk Score:   Fall Risk Score:  `1  Depression screen PHQ 2/9  Depression screen [facility] 2/9 09/22/2018 08/15/2018 05/20/2018 03/10/2018 12/13/2017 08/16/2017 07/02/2017  Decreased Interest 0 0 0 0 0 0 0  Down, Depressed, Hopeless 0 0 0 0 0 0 0  PHQ - 2 Score 0 0 0 0 0 0 0      Review of Systems  All other systems reviewed and are negative.      Objective:   Physical Exam Vitals signs and nursing note reviewed.  Constitutional:      Appearance: Normal appearance.  Neck:     Musculoskeletal: Normal range of motion and neck supple.   Cardiovascular:     Rate and Rhythm: Normal rate and regular rhythm.     Pulses: Normal pulses.     Heart sounds: Normal heart sounds.  Pulmonary:     Effort: Pulmonary effort is normal.     Breath sounds: Normal breath sounds.  Musculoskeletal:     Comments: Normal Muscle Bulk and Muscle Testing Reveals:  Upper Extremities: Decreased ROM 90 Degrees  and Muscle Strength on the Right 5/5 and left 4/5 Bilateral AC Joint Tenderness Thoracic Paraspinal Tenderness: T-7-T-9 Lumbar Lower Extremities: Full ROM and Muscle Strength 5/5  Bilateral Lower Extremities Flexion Produces Pain into Bilateral Patella's Arises from chair with ease Narrow Based  Gait   Skin:    General: Skin is warm and dry.  Neurological:     Mental Status: She is alert and oriented to person, place, and time.           Assessment & Plan:  1. 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 Chronic Pain Syndrome:Refilled:Oxycodone 5 mg one tabletthree times aday as needed. #90.11/15/2018 We will continue the opioid monitoring program, this consists of regular clinic visits, examinations, urine drug screen, pill counts as well as use of 11/17/2018 Controlled Substance Reporting system. 4. ChronicBilateralShoulder Pain:Continue to Alternate Heat and Ice Therapy. Continue HEP as Tolerated. Continue to Monitor. 11/15/2018 5.BilateralHandsOA R>L: Continue current medication regimen. Continue to Monitor.11/15/2018 6. Neuropathic pain Left Hand and Left Foot: ContinuePamelor.11/15/2018  15 minutes of face to face patient care time was spent during this visit. All questions were encouraged and answered.  F/U in 1 month

## 2018-11-28 ENCOUNTER — Telehealth: Payer: Self-pay | Admitting: *Deleted

## 2018-11-28 NOTE — Telephone Encounter (Signed)
Prior Auth submitted to [facility] for Oxycodone 5 mg #90. Submission Date:09/15/2018 Status:APPROVED Effective Begin Date:09/15/2018 Effective End Date:03/14/2019

## 2018-12-15 ENCOUNTER — Encounter: Payer: Self-pay | Admitting: Registered Nurse

## 2018-12-15 ENCOUNTER — Encounter: Payer: Medicaid Other | Attending: Physical Medicine & Rehabilitation | Admitting: Registered Nurse

## 2018-12-15 ENCOUNTER — Other Ambulatory Visit: Payer: Self-pay

## 2018-12-15 VITALS — BP 145/93 | HR 70 | Temp 97.6°F | Resp 12 | Ht 69.0 in | Wt 238.0 lb

## 2018-12-15 DIAGNOSIS — M79641 Pain in right hand: Secondary | ICD-10-CM | POA: Insufficient documentation

## 2018-12-15 DIAGNOSIS — M170 Bilateral primary osteoarthritis of knee: Secondary | ICD-10-CM | POA: Diagnosis not present

## 2018-12-15 DIAGNOSIS — E7800 Pure hypercholesterolemia, unspecified: Secondary | ICD-10-CM | POA: Diagnosis not present

## 2018-12-15 DIAGNOSIS — M19041 Primary osteoarthritis, right hand: Secondary | ICD-10-CM | POA: Diagnosis not present

## 2018-12-15 DIAGNOSIS — G4730 Sleep apnea, unspecified: Secondary | ICD-10-CM | POA: Diagnosis not present

## 2018-12-15 DIAGNOSIS — Z87891 Personal history of nicotine dependence: Secondary | ICD-10-CM | POA: Insufficient documentation

## 2018-12-15 DIAGNOSIS — E119 Type 2 diabetes mellitus without complications: Secondary | ICD-10-CM | POA: Diagnosis not present

## 2018-12-15 DIAGNOSIS — G894 Chronic pain syndrome: Secondary | ICD-10-CM | POA: Diagnosis not present

## 2018-12-15 DIAGNOSIS — M25562 Pain in left knee: Secondary | ICD-10-CM | POA: Diagnosis not present

## 2018-12-15 DIAGNOSIS — M25561 Pain in right knee: Secondary | ICD-10-CM | POA: Insufficient documentation

## 2018-12-15 DIAGNOSIS — I10 Essential (primary) hypertension: Secondary | ICD-10-CM | POA: Diagnosis not present

## 2018-12-15 DIAGNOSIS — M79642 Pain in left hand: Secondary | ICD-10-CM | POA: Insufficient documentation

## 2018-12-15 DIAGNOSIS — Z79891 Long term (current) use of opiate analgesic: Secondary | ICD-10-CM | POA: Diagnosis not present

## 2018-12-15 DIAGNOSIS — M19042 Primary osteoarthritis, left hand: Secondary | ICD-10-CM | POA: Diagnosis not present

## 2018-12-15 DIAGNOSIS — M1389 Other specified arthritis, multiple sites: Secondary | ICD-10-CM | POA: Diagnosis not present

## 2018-12-15 DIAGNOSIS — Z5181 Encounter for therapeutic drug level monitoring: Secondary | ICD-10-CM

## 2018-12-15 DIAGNOSIS — M25572 Pain in left ankle and joints of left foot: Secondary | ICD-10-CM | POA: Insufficient documentation

## 2018-12-15 DIAGNOSIS — M25571 Pain in right ankle and joints of right foot: Secondary | ICD-10-CM | POA: Insufficient documentation

## 2018-12-15 DIAGNOSIS — M2550 Pain in unspecified joint: Secondary | ICD-10-CM | POA: Diagnosis not present

## 2018-12-15 DIAGNOSIS — M19011 Primary osteoarthritis, right shoulder: Secondary | ICD-10-CM | POA: Insufficient documentation

## 2018-12-15 DIAGNOSIS — M0609 Rheumatoid arthritis without rheumatoid factor, multiple sites: Secondary | ICD-10-CM

## 2018-12-15 MED ORDER — OXYCODONE HCL 5 MG PO TABS: 5.0000 mg | ORAL_TABLET | Freq: Three times a day (TID) | ORAL | 0 refills | Status: DC | PRN

## 2018-12-15 NOTE — Progress Notes (Signed)
Subjective:    Patient ID: Tanya Owens, female    DOB: December 05, 1960, 58 y.o.   MRN: [id number]  HPI: Tanya Owens is a 58 y.o. female who returns for follow up appointment for chronic pain and medication refill. She states her pain is usually  located in her joints and generalized pain all over. Today she is pain free, she states. She rates her pain 0. Her current exercise regime is walking.  [name] Morphine equivalent is 22.50 MME.  Last UDS was Performed on 05/20/2018, it was consistent.   Pain Inventory Average Pain 0 Pain Right Now 0 My pain is sharp  In the last 24 hours, has pain interfered with the following? General activity 0 Relation with others 0 Enjoyment of life 0 What TIME of day is your pain at its worst? night Sleep (in general) Good  Pain is worse with: sitting Pain improves with: medication Relief from Meds: 0  Mobility ability to climb steps?  yes  Function disabled: date disabled 2017  Neuro/Psych No problems in this area  Prior Studies n/a  Physicians involved in your care n/a   Family History  Problem Relation Age of Onset  . Heart attack Mother   . Diabetes Mother   . Hypercholesterolemia Mother   . Hypertension Mother   . Diabetes Sister   . Hypertension Brother    Social History   Socioeconomic History  . Marital status: Single    Spouse name: Not on file  . Number of children: Not on file  . Years of education: Not on file  . Highest education level: Not on file  Occupational History  . Not on file  Social Needs  . Financial resource strain: Not on file  . Food insecurity    Worry: Not on file    Inability: Not on file  . Transportation needs    Medical: Not on file    Non-medical: Not on file  Tobacco Use  . Smoking status: Former Smoker    Quit date: 12/27/2012    Years since quitting: 5.9  . Smokeless tobacco: Never Used  Substance and Sexual Activity  . Alcohol use: No  . Drug use: No  . Sexual  activity: Yes    Birth control/protection: Post-menopausal  Lifestyle  . Physical activity    Days per week: Not on file    Minutes per session: Not on file  . Stress: Not on file  Relationships  . Social Herbalist on phone: Not on file    Gets together: Not on file    Attends religious service: Not on file    Active member of club or organization: Not on file    Attends meetings of clubs or organizations: Not on file    Relationship status: Not on file  Other Topics Concern  . Not on file  Social History Narrative  . Not on file   Past Surgical History:  Procedure Laterality Date  . CARDIAC CATHETERIZATION  10/18/2007   Normal coronaries, systemic hypertension   Past Medical History:  Diagnosis Date  . Arthritis 2000  . Diabetes mellitus without complication ([place]) 7829  . Hypercholesteremia 2005  . Hypertension 2005  . Sleep apnea 2010  . Sleep disorder    There were no vitals taken for this visit.  Opioid Risk Score:   Fall Risk Score:  `1  Depression screen [facility] 2/9  Depression screen [facility] 2/9 09/22/2018 08/15/2018 05/20/2018 03/10/2018 12/13/2017 08/16/2017 07/02/2017  Decreased Interest 0 0 0 0 0 0 0  Down, Depressed, Hopeless 0 0 0 0 0 0 0  PHQ - 2 Score 0 0 0 0 0 0 0      Review of Systems  All other systems reviewed and are negative.      Objective:   Physical Exam Vitals signs and nursing note reviewed.  Constitutional:      Appearance: Normal appearance.  Neck:     Musculoskeletal: Normal range of motion and neck supple.  Cardiovascular:     Rate and Rhythm: Normal rate and regular rhythm.     Pulses: Normal pulses.     Heart sounds: Normal heart sounds.  Pulmonary:     Effort: Pulmonary effort is normal.     Breath sounds: Normal breath sounds.  Musculoskeletal:     Comments: Normal Muscle Bulk and Muscle Testing Reveals:  Upper Extremities: Full ROM and Muscle Strength 5/5 Lower Extremities: Full ROM and Muscle Strength 5/5 Arises from  chair with ease Narrow Based Gait   Skin:    General: Skin is warm and dry.  Neurological:     Mental Status: She is alert and oriented to person, place, and time.  Psychiatric:        Mood and Affect: Mood normal.        Behavior: Behavior normal.           Assessment & Plan:  1. 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 Chronic Pain Syndrome:Refilled:Oxycodone 5 mg one tabletthree times aday as needed. #90.12/15/2018 We will continue the opioid monitoring program, this consists of regular clinic visits, examinations, urine drug screen, pill counts as well as use of [place] Controlled Substance Reporting system. 4. ChronicBilateralShoulder Pain:Continue to Alternate Heat and Ice Therapy. Continue HEP as Tolerated. Continue to Monitor. 12/15/2018 5.BilateralHandsOA R>L: Continue current medication regimen. Continue to Monitor.12/15/2018 6. Neuropathic pain Left Hand and Left Foot: ContinuePamelor.12/15/2018  15 minutes of face to face patient care time was spent during this visit. All questions were encouraged and answered.  F/U in 1 month

## 2019-01-16 ENCOUNTER — Encounter: Payer: Medicaid Other | Attending: Physical Medicine & Rehabilitation | Admitting: Registered Nurse

## 2019-01-16 ENCOUNTER — Encounter: Payer: Self-pay | Admitting: Registered Nurse

## 2019-01-16 ENCOUNTER — Other Ambulatory Visit: Payer: Self-pay

## 2019-01-16 VITALS — BP 148/90 | HR 69 | Temp 98.3°F | Ht 69.0 in | Wt 240.0 lb

## 2019-01-16 DIAGNOSIS — Z87891 Personal history of nicotine dependence: Secondary | ICD-10-CM | POA: Insufficient documentation

## 2019-01-16 DIAGNOSIS — M79642 Pain in left hand: Secondary | ICD-10-CM | POA: Insufficient documentation

## 2019-01-16 DIAGNOSIS — I10 Essential (primary) hypertension: Secondary | ICD-10-CM | POA: Diagnosis not present

## 2019-01-16 DIAGNOSIS — G4730 Sleep apnea, unspecified: Secondary | ICD-10-CM | POA: Diagnosis not present

## 2019-01-16 DIAGNOSIS — M170 Bilateral primary osteoarthritis of knee: Secondary | ICD-10-CM

## 2019-01-16 DIAGNOSIS — M19011 Primary osteoarthritis, right shoulder: Secondary | ICD-10-CM | POA: Insufficient documentation

## 2019-01-16 DIAGNOSIS — G894 Chronic pain syndrome: Secondary | ICD-10-CM | POA: Diagnosis not present

## 2019-01-16 DIAGNOSIS — M25562 Pain in left knee: Secondary | ICD-10-CM | POA: Diagnosis not present

## 2019-01-16 DIAGNOSIS — Z79891 Long term (current) use of opiate analgesic: Secondary | ICD-10-CM | POA: Diagnosis not present

## 2019-01-16 DIAGNOSIS — M0609 Rheumatoid arthritis without rheumatoid factor, multiple sites: Secondary | ICD-10-CM

## 2019-01-16 DIAGNOSIS — M25571 Pain in right ankle and joints of right foot: Secondary | ICD-10-CM | POA: Insufficient documentation

## 2019-01-16 DIAGNOSIS — M79641 Pain in right hand: Secondary | ICD-10-CM | POA: Insufficient documentation

## 2019-01-16 DIAGNOSIS — E7800 Pure hypercholesterolemia, unspecified: Secondary | ICD-10-CM | POA: Insufficient documentation

## 2019-01-16 DIAGNOSIS — Z5181 Encounter for therapeutic drug level monitoring: Secondary | ICD-10-CM | POA: Diagnosis not present

## 2019-01-16 DIAGNOSIS — M19041 Primary osteoarthritis, right hand: Secondary | ICD-10-CM | POA: Diagnosis not present

## 2019-01-16 DIAGNOSIS — M1389 Other specified arthritis, multiple sites: Secondary | ICD-10-CM | POA: Insufficient documentation

## 2019-01-16 DIAGNOSIS — M25561 Pain in right knee: Secondary | ICD-10-CM | POA: Diagnosis not present

## 2019-01-16 DIAGNOSIS — M2550 Pain in unspecified joint: Secondary | ICD-10-CM | POA: Diagnosis not present

## 2019-01-16 DIAGNOSIS — M19042 Primary osteoarthritis, left hand: Secondary | ICD-10-CM

## 2019-01-16 DIAGNOSIS — E119 Type 2 diabetes mellitus without complications: Secondary | ICD-10-CM | POA: Diagnosis not present

## 2019-01-16 DIAGNOSIS — M25572 Pain in left ankle and joints of left foot: Secondary | ICD-10-CM | POA: Diagnosis not present

## 2019-01-16 MED ORDER — OXYCODONE HCL 5 MG PO TABS: 5.0000 mg | ORAL_TABLET | Freq: Three times a day (TID) | ORAL | 0 refills | Status: DC | PRN

## 2019-01-16 NOTE — Progress Notes (Signed)
Subjective:    Patient ID: Tanya Owens, female    DOB: 04/21/1961, 58 y.o.   MRN: [id number]  HPI: Tanya Owens is a 58 y.o. female who returns for follow up appointment for chronic pain and medication refill. She states her  pain is located in her bilateral hands, right knee and bilateral ankles. She rates her pain 9. Her current exercise regime is walking and performing stretching exercises.  Tanya Owens Morphine equivalent is 22.50 MME.  UDS was Performed today.  Pain Inventory Average Pain 9 Pain Right Now 9 My pain is aching  In the last 24 hours, has pain interfered with the following? General activity 9 Relation with others n/a Enjoyment of life n/a What TIME of day is your pain at its worst? n/a Sleep (in general) Fair  Pain is worse with: walking and standing Pain improves with: rest Relief from Meds: n/a  Mobility how many minutes can you walk? 30 Do you have any goals in this area?  yes  Function employed # of hrs/week n/a what is your job? n/a not employed: date last employed n/a disabled: date disabled n/a  Neuro/Psych No problems in this area  Prior Studies Any changes since last visit?  no  Physicians involved in your care Any changes since last visit?  no   Family History  Problem Relation Age of Onset  . Heart attack Mother   . Diabetes Mother   . Hypercholesterolemia Mother   . Hypertension Mother   . Diabetes Sister   . Hypertension Brother    Social History   Socioeconomic History  . Marital status: Single    Spouse name: Not on file  . Number of children: Not on file  . Years of education: Not on file  . Highest education level: Not on file  Occupational History  . Not on file  Social Needs  . Financial resource strain: Not on file  . Food insecurity    Worry: Not on file    Inability: Not on file  . Transportation needs    Medical: Not on file    Non-medical: Not on file  Tobacco Use  . Smoking status: Former  Smoker    Quit date: 12/27/2012    Years since quitting: 6.0  . Smokeless tobacco: Never Used  Substance and Sexual Activity  . Alcohol use: No  . Drug use: No  . Sexual activity: Yes    Birth control/protection: Post-menopausal  Lifestyle  . Physical activity    Days per week: Not on file    Minutes per session: Not on file  . Stress: Not on file  Relationships  . Social Musician on phone: Not on file    Gets together: Not on file    Attends religious service: Not on file    Active member of club or organization: Not on file    Attends meetings of clubs or organizations: Not on file    Relationship status: Not on file  Other Topics Concern  . Not on file  Social History Narrative  . Not on file   Past Surgical History:  Procedure Laterality Date  . CARDIAC CATHETERIZATION  10/18/2007   Normal coronaries, systemic hypertension   Past Medical History:  Diagnosis Date  . Arthritis 2000  . Diabetes mellitus without complication (HCC) 2005  . Hypercholesteremia 2005  . Hypertension 2005  . Sleep apnea 2010  . Sleep disorder    BP (!) 148/90  Pulse 69   Temp 98.3 F (36.8 C)   Ht 5\' 9"  (1.753 m)   Wt 240 lb (108.9 kg)   SpO2 98%   BMI 35.44 kg/m   Opioid Risk Score:   Fall Risk Score:  `1  Depression screen PHQ 2/9  Depression screen [facility] 2/9 09/22/2018 08/15/2018 05/20/2018 03/10/2018 12/13/2017 08/16/2017 07/02/2017  Decreased Interest 0 0 0 0 0 0 0  Down, Depressed, Hopeless 0 0 0 0 0 0 0  PHQ - 2 Score 0 0 0 0 0 0 0    Review of Systems  Constitutional: Negative.   HENT: Negative.   Eyes: Negative.   Respiratory: Negative.   Cardiovascular: Negative.   Gastrointestinal: Negative.   Endocrine: Negative.   Genitourinary: Negative.   Musculoskeletal: Negative.   Skin: Negative.   Allergic/Immunologic: Negative.   Neurological: Negative.   Hematological: Negative.   Psychiatric/Behavioral: Negative.   All other systems reviewed and are negative.       Objective:   Physical Exam Vitals signs and nursing note reviewed.  Constitutional:      Appearance: Normal appearance.  Neck:     Musculoskeletal: Normal range of motion and neck supple.  Cardiovascular:     Rate and Rhythm: Normal rate and regular rhythm.     Pulses: Normal pulses.     Heart sounds: Normal heart sounds.  Pulmonary:     Effort: Pulmonary effort is normal.     Breath sounds: Normal breath sounds.  Musculoskeletal:     Comments: Normal Muscle Bulk and Muscle Testing Reveals:  Upper Extremities: Full ROM and Muscle Strength 4/5  Lower Extremities: Full ROM and Muscle Strength 5/5 Arises from chair with ease Narrow Based  Gait   Skin:    General: Skin is warm and dry.  Neurological:     Mental Status: She is alert and oriented to person, place, and time.  Psychiatric:        Mood and Affect: Mood normal.        Behavior: Behavior normal.           Assessment & Plan:  1. 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 Chronic Pain Syndrome:Refilled:Oxycodone 5 mg one tabletthree times aday as needed. #90.01/16/2019 We will continue the opioid monitoring program, this consists of regular clinic visits, examinations, urine drug screen, pill counts as well as use of [place] Controlled Substance Reporting system. 4. ChronicBilateralShoulder Pain:No complaints today.Continue to Alternate Heat and Ice Therapy. Continue HEP as Tolerated. Continue to Monitor. 01/16/2019 5.BilateralHandsOA R>L: Continue current medication regimen. Continue to Monitor.01/16/2019 6. Neuropathic pain Left Hand and Left Foot: ContinuePamelor.01/16/2019  59minutes of face to face patient care time was spent during this visit. All questions were encouraged and answered.  F/U in 1 month

## 2019-01-18 LAB — TOXASSURE SELECT,+ANTIDEPR,UR

## 2019-01-20 ENCOUNTER — Telehealth: Payer: Self-pay | Admitting: *Deleted

## 2019-01-20 NOTE — Telephone Encounter (Signed)
Urine drug screen was negative for medication.  Her last dose was reported the pm before test. She had #27 pills for count at visit which #18 would have been expected. All previous tests have been positive for medication.Marland Kitchen

## 2019-02-11 ENCOUNTER — Encounter: Payer: Self-pay | Admitting: Emergency Medicine

## 2019-02-11 ENCOUNTER — Other Ambulatory Visit: Payer: Self-pay

## 2019-02-11 ENCOUNTER — Ambulatory Visit
Admission: EM | Admit: 2019-02-11 | Discharge: 2019-02-11 | Disposition: A | Discharge status: Home / Self Care | Payer: Medicaid Other | Attending: Emergency Medicine | Admitting: Emergency Medicine

## 2019-02-11 DIAGNOSIS — Z20828 Contact with and (suspected) exposure to other viral communicable diseases: Secondary | ICD-10-CM | POA: Diagnosis not present

## 2019-02-11 DIAGNOSIS — R42 Dizziness and giddiness: Secondary | ICD-10-CM | POA: Diagnosis not present

## 2019-02-11 DIAGNOSIS — F418 Other specified anxiety disorders: Secondary | ICD-10-CM

## 2019-02-11 MED ORDER — MECLIZINE HCL 25 MG PO TABS: 25.0000 mg | ORAL_TABLET | Freq: Three times a day (TID) | ORAL | 0 refills | Status: DC | PRN

## 2019-02-11 NOTE — ED Provider Notes (Signed)
[facility]    CSN: [id number] Arrival date & time: 02/11/19  1258      History   Chief Complaint Chief Complaint  Patient presents with  . Fatigue    HPI Tanya Owens is a 58 y.o. female with history of obesity, diabetes, hypercholesterolemia, hypertension, chronic opioid abuse for chronic pain, and sleep apnea presents for general malaise, single episode of dizziness since yesterday.  Patient states a close friend of hers, who she last saw 2.5 weeks ago, tested positive last week for COVID.  Patient has been asymptomatic, without fever the entire time, though "I am really nervous about it, I do not feel comfortable going back to work until I get tested ".  Patient states that she felt a little dizzy yesterday, "just a little off ".  Denies room spinning sensation, nausea, vomiting, change in vision or hearing, tinnitus.  Did not try new thing for symptoms.  States that she went from washing dishes in the kitchen to her bathroom when it happened.  No additional associated symptoms.  Not currently symptomatic today: Denies history of stroke, MI, anemia.  States that she checks her sugars daily, and are well controlled: Chart review done by me at time of appointment showing last A1c on 09/22/2018: 6.7%.  Denies change in medication, overusing pain medications.  Past Medical History:  Diagnosis Date  . Arthritis 2000  . Diabetes mellitus without complication (HCC) 2005  . Hypercholesteremia 2005  . Hypertension 2005  . Sleep apnea 2010  . Sleep disorder     Patient Active Problem List   Diagnosis Date Noted  . Seronegative arthropathy of multiple sites 07/30/2017  . Encounter for medication management 04/19/2017  . Acromioclavicular joint arthritis 03/26/2017  . Cyclic citrullinated peptide (CCP) antibody positive 10/28/2016  . Obesity (BMI 30-39.9) 11/28/2014  . Type 2 diabetes mellitus without complication (HCC) 11/28/2014  . HTN (hypertension) 06/21/2013  . Mixed  hyperlipidemia 06/21/2013    Past Surgical History:  Procedure Laterality Date  . CARDIAC CATHETERIZATION  10/18/2007   Normal coronaries, systemic hypertension    OB History   No obstetric history on file.      Home Medications    Prior to Admission medications   Medication Sig Start Date End Date Taking? Authorizing Provider  amLODipine (NORVASC) 10 MG tablet Take 1 tablet (10 mg total) by mouth daily. 09/22/18   Leland HerYoo, Elsia J, DO  atorvastatin (LIPITOR) 40 MG tablet take 1 tablet by mouth once daily 09/22/18   Leland HerYoo, Elsia J, DO  Etanercept (ENBREL MINI) 50 MG/ML SOCT Inject into the skin. 09/20/17   [provider]  folic acid (FOLVITE) 1 MG tablet Take 1 tablet (1 mg total) by mouth daily. 04/20/18 04/20/19  Leland HerYoo, Elsia J, DO  hydrochlorothiazide (HYDRODIURIL) 25 MG tablet Take 1 tablet (25 mg total) by mouth daily. 09/22/18   Leland HerYoo, Elsia J, DO  lisinopril (ZESTRIL) 40 MG tablet Take 1 tablet (40 mg total) by mouth daily. 09/22/18   Leland HerYoo, Elsia J, DO  meclizine (ANTIVERT) 25 MG tablet Take 1 tablet (25 mg total) by mouth 3 (three) times daily as needed for dizziness. 02/11/19   Hall-Potvin, [place], PA-C  metFORMIN (GLUCOPHAGE) 1000 MG tablet Take 1 tablet (1,000 mg total) by mouth 2 (two) times daily with a meal. 09/22/18 03/21/19  Leland HerYoo, Elsia J, DO  methotrexate (RHEUMATREX) 5 MG tablet Take 6 tablets (30 mg total) by mouth once a week. Caution: Chemotherapy. Protect from light. 12/13/17  Leland HerYoo, Elsia J, DO  nortriptyline (PAMELOR) 10 MG capsule Take 1 capsule (10 mg total) by mouth at bedtime. 08/15/18   Jones Baleshomas, Eunice L, NP  oxyCODONE (OXY IR/ROXICODONE) 5 MG immediate release tablet Take 1 tablet (5 mg total) by mouth 3 (three) times daily as needed for moderate pain. 01/16/19   Jones Baleshomas, Eunice L, NP  RESTASIS 0.05 % ophthalmic emulsion Place 1 drop into both eyes 2 (two) times daily. Both eyes 04/20/18   Leland HerYoo, Elsia J, DO    Family History Family History  Problem Relation Age of Onset  .  Heart attack Mother   . Diabetes Mother   . Hypercholesterolemia Mother   . Hypertension Mother   . Diabetes Sister   . Hypertension Brother     Social History Social History   Tobacco Use  . Smoking status: Former Smoker    Quit date: 12/27/2012    Years since quitting: 6.1  . Smokeless tobacco: Never Used  Substance Use Topics  . Alcohol use: No  . Drug use: No     Allergies   Tylenol [acetaminophen]   Review of Systems Review of Systems  Constitutional: Negative for activity change, appetite change, fatigue and fever.  HENT: Negative for ear pain, sinus pain, sore throat and voice change.   Eyes: Negative for pain, redness and visual disturbance.  Respiratory: Negative for cough and shortness of breath.   Cardiovascular: Negative for chest pain and palpitations.  Gastrointestinal: Negative for abdominal pain, diarrhea, nausea and vomiting.  Endocrine: Negative for polydipsia, polyphagia and polyuria.  Genitourinary: Negative for difficulty urinating, frequency, pelvic pain and vaginal pain.  Musculoskeletal: Negative for arthralgias, myalgias and neck pain.  Skin: Negative for rash and wound.  Neurological: Negative for tremors, seizures, syncope, facial asymmetry, speech difficulty, weakness, light-headedness, numbness and headaches.     Physical Exam Triage Vital Signs ED Triage Vitals  Enc Vitals Group     BP      Pulse      Resp      Temp      Temp src      SpO2      Weight      Height      Head Circumference      Peak Flow      Pain Score      Pain Loc      Pain Edu?      Excl. in GC?    No data found.  Updated Vital Signs BP 119/82 (BP Location: Right Arm)   Pulse 79   Temp 98.6 F (37 C) (Oral)   Resp 16   SpO2 98%    Physical Exam Vitals signs reviewed.  Constitutional:      General: She is not in acute distress.    Appearance: She is obese. She is not ill-appearing.  HENT:     Head: Normocephalic and atraumatic.     Right Ear:  Tympanic membrane, ear canal and external ear normal.     Left Ear: Tympanic membrane, ear canal and external ear normal.     Nose:     Comments: Bilateral turbinate edema with mucosal injection (L>R)    Mouth/Throat:     Mouth: Mucous membranes are moist.     Pharynx: Oropharynx is clear. No oropharyngeal exudate or posterior oropharyngeal erythema.  Eyes:     General: No scleral icterus.       Right eye: No discharge.        Left eye:  No discharge.     Extraocular Movements: Extraocular movements intact.     Conjunctiva/sclera: Conjunctivae normal.     Pupils: Pupils are equal, round, and reactive to light.  Neck:     Musculoskeletal: Normal range of motion and neck supple. No muscular tenderness.  Cardiovascular:     Rate and Rhythm: Normal rate and regular rhythm.     Pulses: Normal pulses.     Heart sounds: No murmur. No gallop.   Pulmonary:     Effort: Pulmonary effort is normal. No respiratory distress.     Breath sounds: No wheezing or rales.  Chest:     Chest wall: No tenderness.  Abdominal:     General: Abdomen is flat.     Palpations: Abdomen is soft.     Tenderness: There is no abdominal tenderness. There is no guarding.  Musculoskeletal: Normal range of motion.     Right lower leg: No edema.     Left lower leg: No edema.  Lymphadenopathy:     Cervical: No cervical adenopathy.  Skin:    General: Skin is warm.     Capillary Refill: Capillary refill takes less than 2 seconds.     Coloration: Skin is not jaundiced or pale.     Findings: No rash.  Neurological:     General: No focal deficit present.     Mental Status: She is alert and oriented to person, place, and time.     Cranial Nerves: No cranial nerve deficit.     Sensory: No sensory deficit.     Motor: No weakness.     Coordination: Coordination normal.     Gait: Gait normal.     Deep Tendon Reflexes: Reflexes normal.     Comments: Negative head jerk test.  Symptoms not reproduced on exam  Psychiatric:         Mood and Affect: Mood normal.        Thought Content: Thought content normal.      UC Treatments / Results  Labs (all labs ordered are listed, but only abnormal results are displayed) Labs Reviewed  NOVEL CORONAVIRUS, NAA    EKG   Radiology No results found.  Procedures Procedures (including critical care time)  Medications Ordered in UC Medications - No data to display  Initial Impression / Assessment and Plan / UC Course  I have reviewed the triage vital signs and the nursing notes.  Pertinent labs & imaging results that were available during my care of the patient were reviewed by me and considered in my medical decision making (see chart for details).     1.  Dizziness No neurocognitive deficits on exam.  No focal cardiopulmonary findings.  Patient denies chest pain: EKG deferred.  Patient asymptomatic in office and symptoms were not reproduced throughout exam.  Patient emphasizes that she is largely concerned about getting tested for COVID.  Will trial meclizine, keep symptom log, and have patient follow-up with PCP.  Return precautions discussed, patient verbalized understanding and is agreeable to plan.  2.  Anxiety about health Patient feels that she is been feeling "off "largely due to her anxiety regarding COVID pandemic.  Patient requesting testing for reassurance.  Test pending: Patient going to continue quarantine at home in the interim.  Denies SI/HI, feels safe at home. Final Clinical Impressions(s) / UC Diagnoses   Final diagnoses:  Dizziness  Anxiety about health     Discharge Instructions     Your COVID test is pending: Is important  you quarantine at home until your results are back. You may take OTC Tylenol for fever and myalgias.  It is important to drink plenty of water throughout the day to stay hydrated. If you test positive and later develop severe fever, cough, or shortness of breath, it is recommended that you go to the ER for further  evaluation.    ED Prescriptions    Medication Sig Dispense Auth. Provider   meclizine (ANTIVERT) 25 MG tablet Take 1 tablet (25 mg total) by mouth 3 (three) times daily as needed for dizziness. 30 tablet Hall-Potvin, [place], PA-C     PDMP not reviewed this encounter.   Hall-Potvin, [place], [place] 02/12/19 1313

## 2019-02-11 NOTE — ED Triage Notes (Signed)
Per pt she has been feeling fatigue and weak for about 2 days now. Pt said she got dizzy and fell and hit her head on the bed post.Pt said nausea somewhat. No cough, no chest pain, fevers

## 2019-02-11 NOTE — Discharge Instructions (Addendum)
Your COVID test is pending: Is important you quarantine at home until your results are back. °You may take OTC Tylenol for fever and myalgias.  It is important to drink plenty of water throughout the day to stay hydrated. °If you test positive and later develop severe fever, cough, or shortness of breath, it is recommended that you go to the ER for further evaluation. °

## 2019-02-12 ENCOUNTER — Encounter: Payer: Self-pay | Admitting: Emergency Medicine

## 2019-02-12 ENCOUNTER — Telehealth: Payer: Self-pay

## 2019-02-14 LAB — NOVEL CORONAVIRUS, NAA: SARS-CoV-2, NAA: NOT DETECTED

## 2019-02-15 ENCOUNTER — Encounter: Payer: Medicaid Other | Admitting: Registered Nurse

## 2019-02-15 ENCOUNTER — Encounter: Payer: Self-pay | Admitting: Registered Nurse

## 2019-02-15 ENCOUNTER — Telehealth: Payer: Self-pay | Admitting: Registered Nurse

## 2019-02-15 MED ORDER — OXYCODONE HCL 5 MG PO TABS: 5.0000 mg | ORAL_TABLET | Freq: Three times a day (TID) | ORAL | 0 refills | Status: DC | PRN

## 2019-02-15 NOTE — Telephone Encounter (Signed)
Ms. Wohlford came to the office and stated she was exposed to some one who tested positive for COVID-19. She had a swab performed two days ago and waiting on her results. She was instructed to go home and quarantine herself until she receives her results. Also instructed to call office after she receives her results to reschedule her appointment. She verbalizes understanding. PMP was reviewed.

## 2019-02-21 ENCOUNTER — Telehealth: Payer: Self-pay | Admitting: Registered Nurse

## 2019-02-21 NOTE — Telephone Encounter (Signed)
Return Tanya Owens call, she was instructed to F/U with her PCP regarding her fever and not feeling well, she verbalizes understanding. Her medication was e-scribed on 02/15/2019, she verbalizes understanding. Her next scheduled appointment is on 03/17/2019 at 10:00, she verbalizes understanding.

## 2019-02-27 DIAGNOSIS — M0579 Rheumatoid arthritis with rheumatoid factor of multiple sites without organ or systems involvement: Secondary | ICD-10-CM | POA: Diagnosis not present

## 2019-02-27 DIAGNOSIS — M2550 Pain in unspecified joint: Secondary | ICD-10-CM | POA: Diagnosis not present

## 2019-02-27 DIAGNOSIS — Z79899 Other long term (current) drug therapy: Secondary | ICD-10-CM | POA: Diagnosis not present

## 2019-03-17 ENCOUNTER — Encounter: Payer: Medicaid Other | Admitting: Registered Nurse

## 2019-03-22 ENCOUNTER — Encounter: Payer: Medicaid Other | Admitting: Registered Nurse

## 2019-03-24 ENCOUNTER — Encounter: Payer: Self-pay | Admitting: Emergency Medicine

## 2019-03-24 ENCOUNTER — Ambulatory Visit
Admission: EM | Admit: 2019-03-24 | Discharge: 2019-03-24 | Disposition: A | Discharge status: Home / Self Care | Payer: Medicaid Other | Attending: Emergency Medicine | Admitting: Emergency Medicine

## 2019-03-24 DIAGNOSIS — M2550 Pain in unspecified joint: Secondary | ICD-10-CM | POA: Diagnosis not present

## 2019-03-24 DIAGNOSIS — Z79899 Other long term (current) drug therapy: Secondary | ICD-10-CM | POA: Diagnosis not present

## 2019-03-24 DIAGNOSIS — Z20828 Contact with and (suspected) exposure to other viral communicable diseases: Secondary | ICD-10-CM | POA: Diagnosis not present

## 2019-03-24 DIAGNOSIS — Z20822 Contact with and (suspected) exposure to covid-19: Secondary | ICD-10-CM

## 2019-03-24 DIAGNOSIS — M0579 Rheumatoid arthritis with rheumatoid factor of multiple sites without organ or systems involvement: Secondary | ICD-10-CM | POA: Diagnosis not present

## 2019-03-24 NOTE — Discharge Instructions (Signed)
Your COVID test is pending - it is important to quarantine / isolate at home until your results are back. °If you test positive and would like further evaluation for persistent or worsening symptoms, you may schedule an E-visit or virtual (video) visit throughout the [facility] MyChart app or website. ° °PLEASE NOTE: If you develop severe chest pain or shortness of breath please go to the ER or call 9-1-1 for further evaluation --> DO NOT schedule electronic or virtual visits for this. °Please call our office for further guidance / recommendations as needed. °

## 2019-03-24 NOTE — ED Provider Notes (Signed)
[facility]    CSN: [id number] Arrival date & time: 03/24/19  1527      History   Chief Complaint Chief Complaint  Patient presents with  . covid exposure    HPI Tanya Owens is a 58 y.o. female with history of hypertension, diabetes, sleep apnea presenting for Covid testing.  States that her daughter tested positive and that she was in close contact with her about a week ago.  Patient denies fever, symptoms.  Patient is largely concerned given comorbidities that if she has Covid she may not do well.   Past Medical History:  Diagnosis Date  . Arthritis 2000  . Diabetes mellitus without complication (HCC) 2005  . Hypercholesteremia 2005  . Hypertension 2005  . Sleep apnea 2010  . Sleep disorder     Patient Active Problem List   Diagnosis Date Noted  . Seronegative arthropathy of multiple sites 07/30/2017  . Encounter for medication management 04/19/2017  . Acromioclavicular joint arthritis 03/26/2017  . Cyclic citrullinated peptide (CCP) antibody positive 10/28/2016  . Obesity (BMI 30-39.9) 11/28/2014  . Type 2 diabetes mellitus without complication (HCC) 11/28/2014  . HTN (hypertension) 06/21/2013  . Mixed hyperlipidemia 06/21/2013    Past Surgical History:  Procedure Laterality Date  . CARDIAC CATHETERIZATION  10/18/2007   Normal coronaries, systemic hypertension    OB History   No obstetric history on file.      Home Medications    Prior to Admission medications   Medication Sig Start Date End Date Taking? Authorizing Provider  amLODipine (NORVASC) 10 MG tablet Take 1 tablet (10 mg total) by mouth daily. 09/22/18   Leland Her, DO  atorvastatin (LIPITOR) 40 MG tablet take 1 tablet by mouth once daily 09/22/18   Leland Her, DO  Etanercept (ENBREL MINI) 50 MG/ML SOCT Inject into the skin. 09/20/17   [provider]  folic acid (FOLVITE) 1 MG tablet Take 1 tablet (1 mg total) by mouth daily. 04/20/18 04/20/19  Leland Her, DO   hydrochlorothiazide (HYDRODIURIL) 25 MG tablet Take 1 tablet (25 mg total) by mouth daily. 09/22/18   Leland Her, DO  lisinopril (ZESTRIL) 40 MG tablet Take 1 tablet (40 mg total) by mouth daily. 09/22/18   Leland Her, DO  meclizine (ANTIVERT) 25 MG tablet Take 1 tablet (25 mg total) by mouth 3 (three) times daily as needed for dizziness. 02/11/19   Hall-Potvin, [place], PA-C  metFORMIN (GLUCOPHAGE) 1000 MG tablet Take 1 tablet (1,000 mg total) by mouth 2 (two) times daily with a meal. 09/22/18 03/21/19  Leland Her, DO  methotrexate (RHEUMATREX) 5 MG tablet Take 6 tablets (30 mg total) by mouth once a week. Caution: Chemotherapy. Protect from light. 12/13/17   Leland Her, DO  nortriptyline (PAMELOR) 10 MG capsule Take 1 capsule (10 mg total) by mouth at bedtime. 08/15/18   Jones Bales, NP  oxyCODONE (OXY IR/ROXICODONE) 5 MG immediate release tablet Take 1 tablet (5 mg total) by mouth 3 (three) times daily as needed for moderate pain. 02/15/19   Jones Bales, NP  RESTASIS 0.05 % ophthalmic emulsion Place 1 drop into both eyes 2 (two) times daily. Both eyes 04/20/18   Leland Her, DO    Family History Family History  Problem Relation Age of Onset  . Heart attack Mother   . Diabetes Mother   . Hypercholesterolemia Mother   . Hypertension Mother   . Diabetes Sister   . Hypertension  Brother     Social History Social History   Tobacco Use  . Smoking status: Former Smoker    Quit date: 12/27/2012    Years since quitting: 6.2  . Smokeless tobacco: Never Used  Substance Use Topics  . Alcohol use: No  . Drug use: No     Allergies   Tylenol [acetaminophen]   Review of Systems Review of Systems  Constitutional: Negative for fatigue and fever.  HENT: Negative for ear pain, sinus pain, sore throat and voice change.   Eyes: Negative for pain, redness and visual disturbance.  Respiratory: Negative for cough and shortness of breath.   Cardiovascular: Negative for chest pain and  palpitations.  Gastrointestinal: Negative for abdominal pain, diarrhea and vomiting.  Musculoskeletal: Negative for arthralgias and myalgias.  Skin: Negative for rash and wound.  Neurological: Negative for syncope and headaches.     Physical Exam Triage Vital Signs ED Triage Vitals  Enc Vitals Group     BP 03/24/19 1546 (!) 141/93     Pulse Rate 03/24/19 1546 65     Resp 03/24/19 1546 20     Temp 03/24/19 1546 98.8 F (37.1 C)     Temp Source 03/24/19 1546 Oral     SpO2 03/24/19 1546 98 %     Weight --      Height --      Head Circumference --      Peak Flow --      Pain Score 03/24/19 1547 0     Pain Loc --      Pain Edu? --      Excl. in GC? --    No data found.  Updated Vital Signs BP (!) 141/93 (BP Location: Left Arm)   Pulse 65   Temp 98.8 F (37.1 C) (Oral)   Resp 20   SpO2 98%   Visual Acuity Right Eye Distance:   Left Eye Distance:   Bilateral Distance:    Right Eye Near:   Left Eye Near:    Bilateral Near:     Physical Exam Constitutional:      General: She is not in acute distress. HENT:     Head: Normocephalic and atraumatic.  Eyes:     General: No scleral icterus.    Pupils: Pupils are equal, round, and reactive to light.  Cardiovascular:     Rate and Rhythm: Normal rate.  Pulmonary:     Effort: Pulmonary effort is normal.  Skin:    Coloration: Skin is not jaundiced or pale.  Neurological:     Mental Status: She is alert and oriented to person, place, and time.      UC Treatments / Results  Labs (all labs ordered are listed, but only abnormal results are displayed) Labs Reviewed  NOVEL CORONAVIRUS, NAA    EKG   Radiology No results found.  Procedures Procedures (including critical care time)  Medications Ordered in UC Medications - No data to display  Initial Impression / Assessment and Plan / UC Course  I have reviewed the triage vital signs and the nursing notes.  Pertinent labs & imaging results that were  available during my care of the patient were reviewed by me and considered in my medical decision making (see chart for details).     Covid test pending: Patient to quarantine until results are back.  Return precautions discussed, patient verbalized understanding and is agreeable to plan. Final Clinical Impressions(s) / UC Diagnoses   Final diagnoses:  Close  exposure to COVID-19 virus     Discharge Instructions     Your COVID test is pending - it is important to quarantine / isolate at home until your results are back. If you test positive and would like further evaluation for persistent or worsening symptoms, you may schedule an E-visit or virtual (video) visit throughout the [facility] app or website.  PLEASE NOTE: If you develop severe chest pain or shortness of breath please go to the ER or call [id number] for further evaluation --> DO NOT schedule electronic or virtual visits for this. Please call our office for further guidance / recommendations as needed.   ED Prescriptions    None     PDMP not reviewed this encounter.   Hall-Potvin, [place], [place] 03/24/19 1554

## 2019-03-24 NOTE — ED Triage Notes (Signed)
Pt states her daughter was tested positive for COVID, she was with her a week ago; denies any sx's just wants tested

## 2019-03-25 LAB — NOVEL CORONAVIRUS, NAA: SARS-CoV-2, NAA: NOT DETECTED

## 2019-03-29 ENCOUNTER — Encounter: Payer: Self-pay | Admitting: Registered Nurse

## 2019-03-29 ENCOUNTER — Other Ambulatory Visit: Payer: Self-pay

## 2019-03-29 ENCOUNTER — Encounter: Payer: Medicaid Other | Admitting: Registered Nurse

## 2019-03-29 ENCOUNTER — Encounter: Payer: Medicaid Other | Attending: Physical Medicine & Rehabilitation | Admitting: Registered Nurse

## 2019-03-29 VITALS — BP 145/96 | HR 75 | Temp 97.7°F | Ht 69.0 in | Wt 240.8 lb

## 2019-03-29 DIAGNOSIS — M79642 Pain in left hand: Secondary | ICD-10-CM | POA: Diagnosis not present

## 2019-03-29 DIAGNOSIS — M79641 Pain in right hand: Secondary | ICD-10-CM | POA: Diagnosis not present

## 2019-03-29 DIAGNOSIS — M19042 Primary osteoarthritis, left hand: Secondary | ICD-10-CM

## 2019-03-29 DIAGNOSIS — Z87891 Personal history of nicotine dependence: Secondary | ICD-10-CM | POA: Diagnosis not present

## 2019-03-29 DIAGNOSIS — M2550 Pain in unspecified joint: Secondary | ICD-10-CM

## 2019-03-29 DIAGNOSIS — Z5181 Encounter for therapeutic drug level monitoring: Secondary | ICD-10-CM

## 2019-03-29 DIAGNOSIS — E7800 Pure hypercholesterolemia, unspecified: Secondary | ICD-10-CM | POA: Diagnosis not present

## 2019-03-29 DIAGNOSIS — M19041 Primary osteoarthritis, right hand: Secondary | ICD-10-CM

## 2019-03-29 DIAGNOSIS — M19011 Primary osteoarthritis, right shoulder: Secondary | ICD-10-CM | POA: Diagnosis not present

## 2019-03-29 DIAGNOSIS — M25561 Pain in right knee: Secondary | ICD-10-CM | POA: Insufficient documentation

## 2019-03-29 DIAGNOSIS — M25571 Pain in right ankle and joints of right foot: Secondary | ICD-10-CM | POA: Diagnosis not present

## 2019-03-29 DIAGNOSIS — M25562 Pain in left knee: Secondary | ICD-10-CM | POA: Diagnosis not present

## 2019-03-29 DIAGNOSIS — G894 Chronic pain syndrome: Secondary | ICD-10-CM

## 2019-03-29 DIAGNOSIS — Z79891 Long term (current) use of opiate analgesic: Secondary | ICD-10-CM

## 2019-03-29 DIAGNOSIS — E119 Type 2 diabetes mellitus without complications: Secondary | ICD-10-CM | POA: Insufficient documentation

## 2019-03-29 DIAGNOSIS — M1389 Other specified arthritis, multiple sites: Secondary | ICD-10-CM | POA: Diagnosis not present

## 2019-03-29 DIAGNOSIS — G4730 Sleep apnea, unspecified: Secondary | ICD-10-CM | POA: Insufficient documentation

## 2019-03-29 DIAGNOSIS — M25572 Pain in left ankle and joints of left foot: Secondary | ICD-10-CM | POA: Diagnosis not present

## 2019-03-29 DIAGNOSIS — I10 Essential (primary) hypertension: Secondary | ICD-10-CM | POA: Diagnosis not present

## 2019-03-29 DIAGNOSIS — M0609 Rheumatoid arthritis without rheumatoid factor, multiple sites: Secondary | ICD-10-CM

## 2019-03-29 MED ORDER — OXYCODONE HCL 5 MG PO TABS: 5.0000 mg | ORAL_TABLET | Freq: Three times a day (TID) | ORAL | 0 refills | Status: DC | PRN

## 2019-03-29 NOTE — Progress Notes (Signed)
Subjective:    Patient ID: Tanya Owens, female    DOB: [birth year], 58 y.o.   MRN: [id number]  HPI: Tanya Owens is a 58 y.o. female who returns for follow up appointment for chronic pain and medication refill. She states her pain is located in her bilateral feet. She rates her pain 9. Her current exercise regime is walking and performing stretching exercises.  Tanya Owens Morphine equivalent is 22.50  MME.  Last UDS was Performed on 01/16/2019, see note for details.    Pain Inventory Average Pain 9 Pain Right Now 9 My pain is sharp and aching  In the last 24 hours, has pain interfered with the following? General activity 9 Relation with others 9 Enjoyment of life 9 What TIME of day is your pain at its worst? morning and night Sleep (in general) Fair  Pain is worse with: walking and standing Pain improves with: rest and medication Relief from Meds: 7  Mobility how many minutes can you walk? 30 ability to climb steps?  yes do you drive?  yes  Function not employed: date last employed na disabled: date disabled na  Neuro/Psych No problems in this area  Prior Studies Any changes since last visit?  no  Physicians involved in your care Any changes since last visit?  no Rheumatologist .   Family History  Problem Relation Age of Onset  . Heart attack Mother   . Diabetes Mother   . Hypercholesterolemia Mother   . Hypertension Mother   . Diabetes Sister   . Hypertension Brother    Social History   Socioeconomic History  . Marital status: Single    Spouse name: Not on file  . Number of children: Not on file  . Years of education: Not on file  . Highest education level: Not on file  Occupational History  . Not on file  Social Needs  . Financial resource strain: Not on file  . Food insecurity    Worry: Not on file    Inability: Not on file  . Transportation needs    Medical: Not on file    Non-medical: Not on file  Tobacco Use  . Smoking status:  Former Smoker    Quit date: 12/27/2012    Years since quitting: 6.2  . Smokeless tobacco: Never Used  Substance and Sexual Activity  . Alcohol use: No  . Drug use: No  . Sexual activity: Yes    Birth control/protection: Post-menopausal  Lifestyle  . Physical activity    Days per week: Not on file    Minutes per session: Not on file  . Stress: Not on file  Relationships  . Social Herbalist on phone: Not on file    Gets together: Not on file    Attends religious service: Not on file    Active member of club or organization: Not on file    Attends meetings of clubs or organizations: Not on file    Relationship status: Not on file  Other Topics Concern  . Not on file  Social History Narrative  . Not on file   Past Surgical History:  Procedure Laterality Date  . CARDIAC CATHETERIZATION  10/18/2007   Normal coronaries, systemic hypertension   Past Medical History:  Diagnosis Date  . Arthritis 2000  . Diabetes mellitus without complication ([place]) 9937  . Hypercholesteremia 2005  . Hypertension 2005  . Sleep apnea 2010  . Sleep disorder    BP Marland Kitchen)  145/96   Pulse 75   Temp 97.7 F (36.5 C)   Ht 5\' 9"  (1.753 m)   Wt 240 lb 12.8 oz (109.2 kg)   SpO2 98%   BMI 35.56 kg/m   Opioid Risk Score:   Fall Risk Score:  `1  Depression screen PHQ 2/9  Depression screen [facility] 2/9 09/22/2018 08/15/2018 05/20/2018 03/10/2018 12/13/2017 08/16/2017 07/02/2017  Decreased Interest 0 0 0 0 0 0 0  Down, Depressed, Hopeless 0 0 0 0 0 0 0  PHQ - 2 Score 0 0 0 0 0 0 0    Review of Systems  Neurological: Positive for numbness.  All other systems reviewed and are negative.      Objective:   Physical Exam Vitals signs and nursing note reviewed.  Constitutional:      Appearance: Normal appearance.  Neck:     Musculoskeletal: Normal range of motion and neck supple.  Cardiovascular:     Rate and Rhythm: Normal rate and regular rhythm.     Pulses: Normal pulses.     Heart sounds:  Normal heart sounds.  Pulmonary:     Effort: Pulmonary effort is normal.     Breath sounds: Normal breath sounds.  Abdominal:     General: Abdomen is flat.     Palpations: Abdomen is soft.  Musculoskeletal:     Comments: Normal Muscle Bulk and Muscle Testing Reveals:  Upper Extremities: Full ROM and Muscle Strength 5/5  Lower Extremities: Full ROM and Muscle Strength 5/5 Arises from chair with ease Narrow Based  Gait   Skin:    General: Skin is warm and dry.  Neurological:     Mental Status: She is alert and oriented to person, place, and time.  Psychiatric:        Mood and Affect: Mood normal.        Behavior: Behavior normal.           Assessment & Plan:  1. 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 Chronic Pain Syndrome:Refilled:Oxycodone 5 mg one tabletthree times aday as needed. #90.03/29/2019 We will continue the opioid monitoring program, this consists of regular clinic visits, examinations, urine drug screen, pill counts as well as use of 13/03/2019 Controlled Substance Reporting system. 4. ChronicBilateralShoulder Pain:No complaints today.Continue to Alternate Heat and Ice Therapy. Continue HEP as Tolerated. Continue to Monitor. 03/29/2019 5.BilateralHandsOA R>L: Continue current medication regimen. Continue to Monitor.03/29/2019 6. Neuropathic pain Left Hand and Left Foot: ContinuePamelor.03/29/2019  13/03/2019 of face to face patient care time was spent during this visit. All questions were encouraged and answered.  F/U in 1 month

## 2019-03-30 ENCOUNTER — Telehealth: Payer: Self-pay | Admitting: *Deleted

## 2019-03-30 IMAGING — DX DG HAND 2V*L*
2 series · 2 of 2 positions shown · non-contrast
Comparison: None.

CLINICAL DATA: Bilateral hand pain and swelling without known
injury.

EXAM:
LEFT HAND - 2 VIEW

[dg hand 2 view left (1 of 2)]
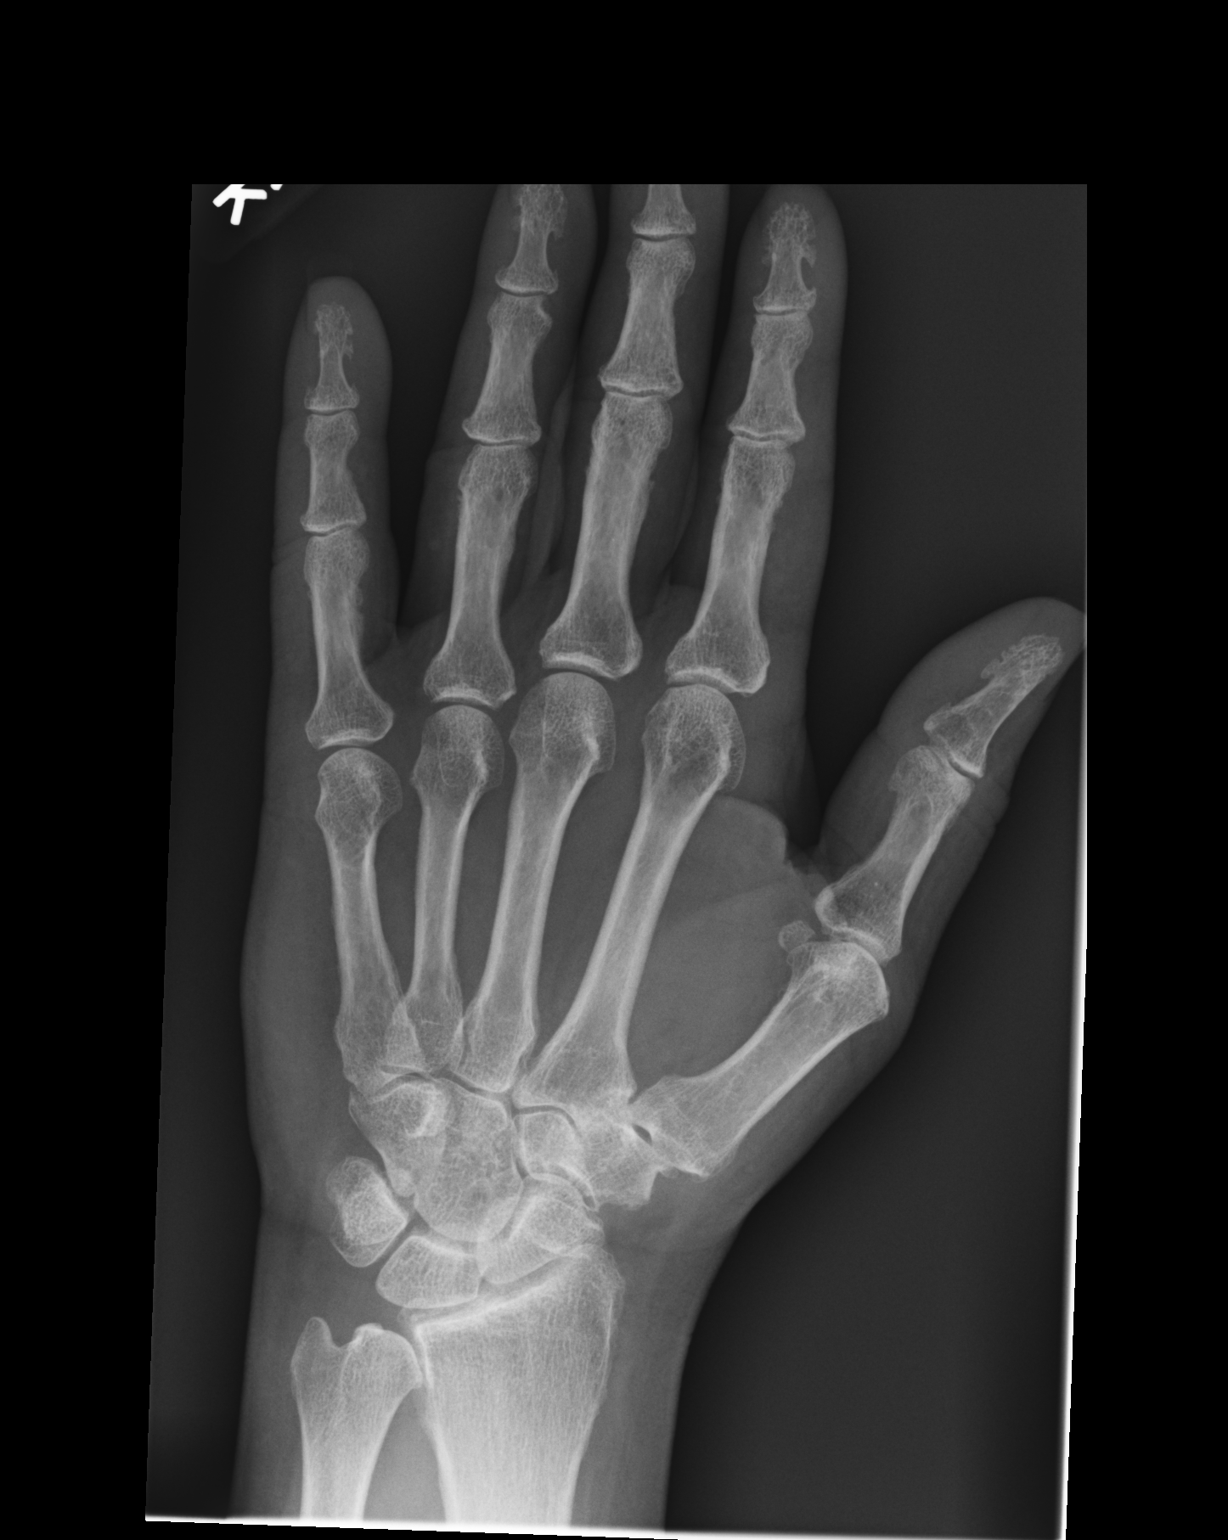

[dg hand 2 view left (2 of 2)]
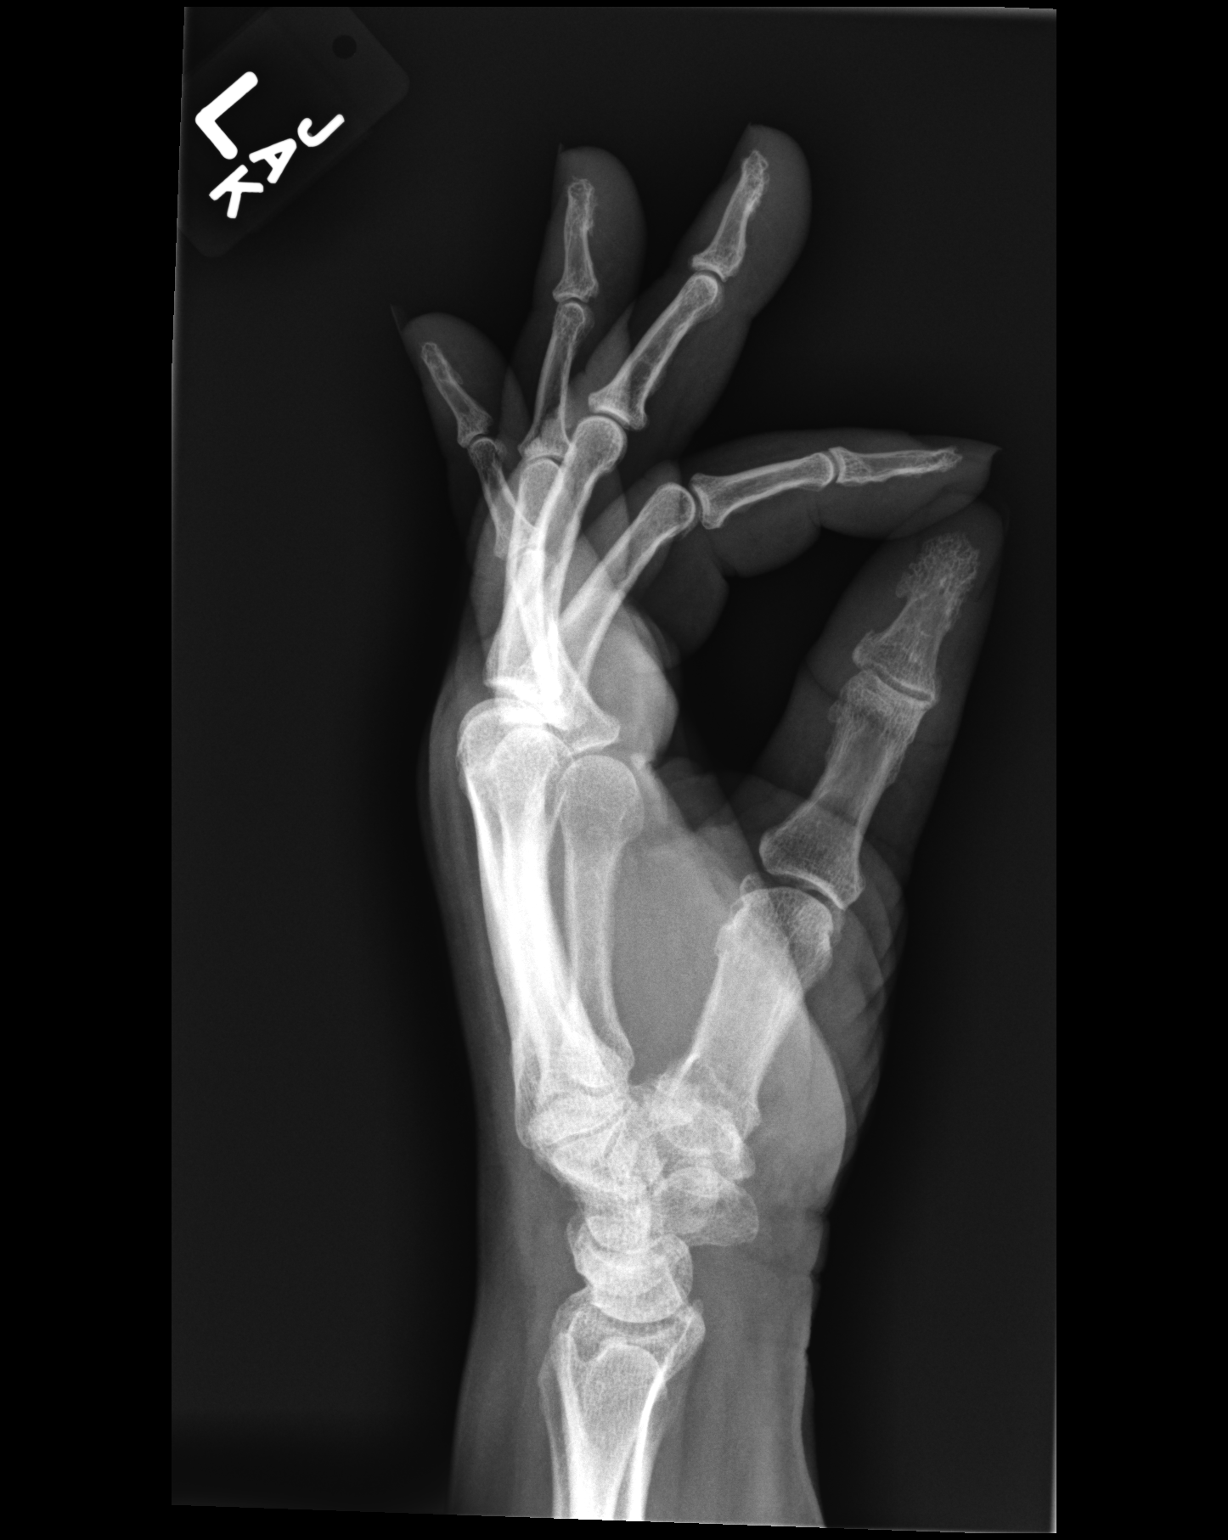

[2 of 2 positions shown; findings below may reference images not displayed]

FINDINGS: There is no evidence of fracture or dislocation. There is no
evidence of arthropathy or other focal bone abnormality. Soft
tissues are unremarkable.
IMPRESSION: No acute abnormality seen in the left hand.

## 2019-03-30 NOTE — Telephone Encounter (Addendum)
Prior authorization submitted to [facility] for oxycodone 5 mg. Waiting for response.  Approved 03/30/2019-09/26/2019

## 2019-04-19 ENCOUNTER — Encounter: Payer: Self-pay | Admitting: Gastroenterology

## 2019-04-19 ENCOUNTER — Other Ambulatory Visit: Payer: Self-pay

## 2019-04-19 ENCOUNTER — Ambulatory Visit (INDEPENDENT_AMBULATORY_CARE_PROVIDER_SITE_OTHER): Payer: Medicaid Other | Admitting: Family Medicine

## 2019-04-19 ENCOUNTER — Encounter: Payer: Self-pay | Admitting: Family Medicine

## 2019-04-19 VITALS — BP 128/72 | HR 79 | Wt 242.0 lb

## 2019-04-19 DIAGNOSIS — E119 Type 2 diabetes mellitus without complications: Secondary | ICD-10-CM | POA: Diagnosis not present

## 2019-04-19 DIAGNOSIS — M1389 Other specified arthritis, multiple sites: Secondary | ICD-10-CM

## 2019-04-19 DIAGNOSIS — Z0000 Encounter for general adult medical examination without abnormal findings: Secondary | ICD-10-CM | POA: Diagnosis not present

## 2019-04-19 DIAGNOSIS — M0609 Rheumatoid arthritis without rheumatoid factor, multiple sites: Secondary | ICD-10-CM

## 2019-04-19 LAB — POCT GLYCOSYLATED HEMOGLOBIN (HGB A1C): HbA1c, POC (prediabetic range): 6.4 % (ref 5.7–6.4)

## 2019-04-19 NOTE — Patient Instructions (Addendum)
It was great to see you!  Our plans for today:  - We're going to check a few labs, CBC, CMP, cholesterol. - Excellent job on the A1C! - Continue to work on diet and exercise  We are checking some labs today, we will call you or send you a letter if they are abnormal.   Take care and seek immediate care sooner if you develop any concerns.   Dr. [facility]

## 2019-04-19 NOTE — Progress Notes (Signed)
   Subjective:    Patient ID: Tanya Owens, female    DOB: [birth year], 58 y.o.   MRN: [id number]   CC: Lab check  HPI:  Tanya Owens is a very pleasant 58 year old female that presents today for lab work prior to going to her rheumatologist.  She states that she comes in every 3 months to get lab work prior to this appointment.  She states that overall she is doing well.  She has gained approximately 10 pounds since the initiation of Covid, but has continued to try to walk around her neighborhood for additional exercise. With regard to diabetes patient states that she is doing well on her metformin at this time.  She denies side effects.  She continues to try to walk around her neighborhood for exercise.  Her A1c has improved during this appointment from 6.7 previously to 6.4 today.  ROS: pertinent noted in the HPI   Pertinent PMH, PSH, FH, SoHx: Past medical history seronegative arthropathy, followed by rheumatology.  Smoking status - No, 20year hx, quit 35yrs ago  Objective:  BP 128/72   Pulse 79   Wt 242 lb (109.8 kg)   SpO2 99%   BMI 35.74 kg/m   Vitals and nursing note reviewed  General: NAD, pleasant, able to participate in exam Cardiac: RRR, S1 S2 present, no murmurs. Respiratory: CTAB, normal effort, No wheezes, rales or rhonchi Abdomen: Nontender, nondistended, bowel sounds present.. Neuro: alert, no obvious focal deficits Psych: Normal affect and mood   Assessment & Plan:    Type 2 diabetes mellitus without complication Plan: -X3G improved from 6.7-6.4 -Encouraged continuing work on diet and exercise -Patient follow-up in 3 months  Seronegative arthropathy of multiple sites Plan: -Will check patient requested labs of CBC, CMP prior to patient's appointment with her rheumatologist -Patient continuing to follow-up with her rheumatologist regularly  -Provided patient referral for colonoscopy -Patient plans to have diabetic eye exam performed in the next 1-2  months  [name], [place] PGY-1

## 2019-04-19 NOTE — Assessment & Plan Note (Signed)
Plan: -A1c improved from 6.7-6.4 -Encouraged continuing work on diet and exercise -Patient follow-up in 3 months

## 2019-04-19 NOTE — Assessment & Plan Note (Signed)
Plan: -Will check patient requested labs of CBC, CMP prior to patient's appointment with her rheumatologist -Patient continuing to follow-up with her rheumatologist regularly

## 2019-04-20 LAB — CBC
Hematocrit: 38.7 % (ref 34.0–46.6)
Hemoglobin: 12.7 g/dL (ref 11.1–15.9)
MCH: 27.2 pg (ref 26.6–33.0)
MCHC: 32.8 g/dL (ref 31.5–35.7)
MCV: 83 fL (ref 79–97)
Platelets: 265 10*3/uL (ref 150–450)
RBC: 4.67 x10E6/uL (ref 3.77–5.28)
RDW: 14.8 % (ref 11.7–15.4)
WBC: 8.2 10*3/uL (ref 3.4–10.8)

## 2019-04-20 LAB — COMPREHENSIVE METABOLIC PANEL
ALT: 19 IU/L (ref 0–32)
AST: 18 IU/L (ref 0–40)
Albumin/Globulin Ratio: 1.6 (ref 1.2–2.2)
Albumin: 4.6 g/dL (ref 3.8–4.9)
Alkaline Phosphatase: 86 IU/L (ref 39–117)
BUN/Creatinine Ratio: 15 (ref 12–28)
BUN: 11 mg/dL (ref 8–27)
Bilirubin Total: 0.4 mg/dL (ref 0.0–1.2)
CO2: 23 mmol/L (ref 20–29)
Calcium: 9.6 mg/dL (ref 8.7–10.3)
Chloride: 101 mmol/L (ref 96–106)
Creatinine, Ser: 0.71 mg/dL (ref 0.57–1.00)
GFR calc Af Amer: 107 mL/min/{1.73_m2} (ref >59)
GFR calc non Af Amer: 93 mL/min/{1.73_m2} (ref >59)
Globulin, Total: 2.8 g/dL (ref 1.5–4.5)
Glucose: 102 mg/dL — ABNORMAL HIGH (ref 65–99)
Potassium: 4.2 mmol/L (ref 3.5–5.2)
Sodium: 137 mmol/L (ref 134–144)
Total Protein: 7.4 g/dL (ref 6.0–8.5)

## 2019-04-20 LAB — LIPID PANEL
Chol/HDL Ratio: 2.7 ratio (ref 0.0–4.4)
Cholesterol, Total: 157 mg/dL (ref 100–199)
HDL: 58 mg/dL (ref >39)
LDL Chol Calc (NIH): 81 mg/dL (ref 0–99)
Triglycerides: 97 mg/dL (ref 0–149)
VLDL Cholesterol Cal: 18 mg/dL (ref 5–40)

## 2019-05-05 ENCOUNTER — Encounter: Payer: Medicaid Other | Attending: Physical Medicine & Rehabilitation | Admitting: Registered Nurse

## 2019-05-05 ENCOUNTER — Encounter: Payer: Self-pay | Admitting: Registered Nurse

## 2019-05-05 ENCOUNTER — Other Ambulatory Visit: Payer: Self-pay

## 2019-05-05 VITALS — BP 128/70 | Ht 69.0 in | Wt 240.0 lb

## 2019-05-05 DIAGNOSIS — M19042 Primary osteoarthritis, left hand: Secondary | ICD-10-CM | POA: Diagnosis not present

## 2019-05-05 DIAGNOSIS — M792 Neuralgia and neuritis, unspecified: Secondary | ICD-10-CM

## 2019-05-05 DIAGNOSIS — M79642 Pain in left hand: Secondary | ICD-10-CM | POA: Insufficient documentation

## 2019-05-05 DIAGNOSIS — M25511 Pain in right shoulder: Secondary | ICD-10-CM | POA: Diagnosis not present

## 2019-05-05 DIAGNOSIS — G894 Chronic pain syndrome: Secondary | ICD-10-CM | POA: Diagnosis not present

## 2019-05-05 DIAGNOSIS — M25572 Pain in left ankle and joints of left foot: Secondary | ICD-10-CM | POA: Insufficient documentation

## 2019-05-05 DIAGNOSIS — M170 Bilateral primary osteoarthritis of knee: Secondary | ICD-10-CM

## 2019-05-05 DIAGNOSIS — M19041 Primary osteoarthritis, right hand: Secondary | ICD-10-CM

## 2019-05-05 DIAGNOSIS — E7800 Pure hypercholesterolemia, unspecified: Secondary | ICD-10-CM | POA: Insufficient documentation

## 2019-05-05 DIAGNOSIS — M25561 Pain in right knee: Secondary | ICD-10-CM | POA: Insufficient documentation

## 2019-05-05 DIAGNOSIS — M1389 Other specified arthritis, multiple sites: Secondary | ICD-10-CM | POA: Diagnosis not present

## 2019-05-05 DIAGNOSIS — Z5181 Encounter for therapeutic drug level monitoring: Secondary | ICD-10-CM

## 2019-05-05 DIAGNOSIS — Z87891 Personal history of nicotine dependence: Secondary | ICD-10-CM | POA: Insufficient documentation

## 2019-05-05 DIAGNOSIS — Z79891 Long term (current) use of opiate analgesic: Secondary | ICD-10-CM | POA: Diagnosis not present

## 2019-05-05 DIAGNOSIS — M25571 Pain in right ankle and joints of right foot: Secondary | ICD-10-CM | POA: Insufficient documentation

## 2019-05-05 DIAGNOSIS — M25512 Pain in left shoulder: Secondary | ICD-10-CM | POA: Diagnosis not present

## 2019-05-05 DIAGNOSIS — M79641 Pain in right hand: Secondary | ICD-10-CM | POA: Insufficient documentation

## 2019-05-05 DIAGNOSIS — M2550 Pain in unspecified joint: Secondary | ICD-10-CM

## 2019-05-05 DIAGNOSIS — M1712 Unilateral primary osteoarthritis, left knee: Secondary | ICD-10-CM

## 2019-05-05 DIAGNOSIS — I10 Essential (primary) hypertension: Secondary | ICD-10-CM | POA: Insufficient documentation

## 2019-05-05 DIAGNOSIS — M19011 Primary osteoarthritis, right shoulder: Secondary | ICD-10-CM | POA: Insufficient documentation

## 2019-05-05 DIAGNOSIS — M0609 Rheumatoid arthritis without rheumatoid factor, multiple sites: Secondary | ICD-10-CM

## 2019-05-05 DIAGNOSIS — G4730 Sleep apnea, unspecified: Secondary | ICD-10-CM | POA: Insufficient documentation

## 2019-05-05 DIAGNOSIS — M25562 Pain in left knee: Secondary | ICD-10-CM | POA: Insufficient documentation

## 2019-05-05 DIAGNOSIS — E119 Type 2 diabetes mellitus without complications: Secondary | ICD-10-CM | POA: Insufficient documentation

## 2019-05-05 NOTE — Progress Notes (Signed)
Subjective:    Patient ID: Tanya Owens, female    DOB: 21-Sep-1960, 58 y.o.   MRN: [id number]  HPI: [name] is a 58 y.o. female appointment was changed to a virtual office visit to reduce the risk of exposure to the COVID-19 virus and to help [name] remain healthy and safe. The virtual visit will also provide continuity of care. [name] understanding and agrees to virtual visit. She states her pain is located in her bilateral hands and left knee. She rates her pain 6. Her  current exercise regime is walking.  [name] Morphine equivalent is 22.50 MME.  Last UDS was on 01/16/2019, see note for details.   Pain Inventory Average Pain 6 Pain Right Now 6 My pain is intermittent, constant and aching  In the last 24 hours, has pain interfered with the following? General activity 0 Relation with others 0 Enjoyment of life 0 What TIME of day is your pain at its worst? daytime Sleep (in general) Poor  Pain is worse with: walking, bending, sitting and standing Pain improves with: medication Relief from Meds: 9  Mobility walk without assistance ability to climb steps?  yes do you drive?  yes  Function disabled: date disabled .  Neuro/Psych No problems in this area  Prior Studies Any changes since last visit?  no  Physicians involved in your care Any changes since last visit?  no   Family History  Problem Relation Age of Onset  . Heart attack Mother   . Diabetes Mother   . Hypercholesterolemia Mother   . Hypertension Mother   . Diabetes Sister   . Hypertension Brother    Social History   Socioeconomic History  . Marital status: Single    Spouse name: Not on file  . Number of children: Not on file  . Years of education: Not on file  . Highest education level: Not on file  Occupational History  . Not on file  Tobacco Use  . Smoking status: Former Smoker    Quit date: 12/27/2012    Years since quitting: 6.3  . Smokeless tobacco:  Never Used  Substance and Sexual Activity  . Alcohol use: No  . Drug use: No  . Sexual activity: Yes    Birth control/protection: Post-menopausal  Other Topics Concern  . Not on file  Social History Narrative  . Not on file   Social Determinants of Health   Financial Resource Strain:   . Difficulty of Paying Living Expenses: Not on file  Food Insecurity:   . Worried About Programme researcher, broadcasting/film/video in the Last Year: Not on file  . Ran Out of Food in the Last Year: Not on file  Transportation Needs:   . Lack of Transportation (Medical): Not on file  . Lack of Transportation (Non-Medical): Not on file  Physical Activity:   . Days of Exercise per Week: Not on file  . Minutes of Exercise per Session: Not on file  Stress:   . Feeling of Stress : Not on file  Social Connections:   . Frequency of Communication with Friends and Family: Not on file  . Frequency of Social Gatherings with Friends and Family: Not on file  . Attends Religious Services: Not on file  . Active Member of Clubs or Organizations: Not on file  . Attends Banker Meetings: Not on file  . Marital Status: Not on file   Past Surgical History:  Procedure Laterality Date  .  CARDIAC CATHETERIZATION  10/18/2007   Normal coronaries, systemic hypertension   Past Medical History:  Diagnosis Date  . Arthritis 2000  . Diabetes mellitus without complication ([place]) 3845  . Hypercholesteremia 2005  . Hypertension 2005  . Sleep apnea 2010  . Sleep disorder    There were no vitals taken for this visit.  Opioid Risk Score:   Fall Risk Score:  `1  Depression screen PHQ 2/9  Depression screen [facility] 2/9 04/19/2019 09/22/2018 08/15/2018 05/20/2018 03/10/2018 12/13/2017 08/16/2017  Decreased Interest 0 0 0 0 0 0 0  Down, Depressed, Hopeless 0 0 0 0 0 0 0  PHQ - 2 Score 0 0 0 0 0 0 0    Review of Systems  Constitutional: Negative.   HENT: Negative.   Eyes: Negative.   Respiratory: Negative.   Gastrointestinal: Negative.    Endocrine: Negative.   Genitourinary: Negative.   Musculoskeletal: Positive for arthralgias.  Skin: Negative.   Allergic/Immunologic: Negative.   Neurological: Negative.   Hematological: Negative.   Psychiatric/Behavioral: Negative.   All other systems reviewed and are negative.      Objective:   Physical Exam Vitals and nursing note reviewed.  Musculoskeletal:     Comments: No Physical Exam: Virtual Visit           Assessment & Plan:  1. 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 Chronic Pain Syndrome:ContinueOxycodone 5 mg one tabletthree times aday as needed. #90.05/05/2019 We will continue the opioid monitoring program, this consists of regular clinic visits, examinations, urine drug screen, pill counts as well as use of [place] Controlled Substance Reporting system. 4. ChronicBilateralShoulder Pain:No complaints today.Continue to Alternate Heat and Ice Therapy. Continue HEP as Tolerated. Continue to Monitor. 05/05/2019 5.BilateralHandsOA R>L: Continue current medication regimen. Continue to Monitor.05/05/2019 6. Neuropathic pain Left Hand and Left Foot: ContinuePamelor.05/05/2019  F/U in 1 month  Telephone Call  Location of patient: In her Home Location of provider: Office Established patient Time spent on call: 10 Minutes

## 2019-05-24 ENCOUNTER — Other Ambulatory Visit: Payer: Self-pay | Admitting: *Deleted

## 2019-05-24 DIAGNOSIS — E119 Type 2 diabetes mellitus without complications: Secondary | ICD-10-CM

## 2019-05-25 ENCOUNTER — Ambulatory Visit: Payer: Medicaid Other | Attending: Internal Medicine

## 2019-05-25 DIAGNOSIS — Z20822 Contact with and (suspected) exposure to covid-19: Secondary | ICD-10-CM | POA: Diagnosis not present

## 2019-05-25 MED ORDER — METFORMIN HCL 1000 MG PO TABS: 1000.0000 mg | ORAL_TABLET | Freq: Two times a day (BID) | ORAL | 1 refills | Status: DC

## 2019-05-27 ENCOUNTER — Telehealth: Payer: Self-pay | Admitting: Adult Health

## 2019-05-27 LAB — NOVEL CORONAVIRUS, NAA: SARS-CoV-2, NAA: DETECTED — AB

## 2019-05-27 NOTE — Telephone Encounter (Signed)
Attempted to call patient about positive results.  Unable to leave message on machine, due to voice mail not being set up.    Lillard Anes, NP

## 2019-05-30 ENCOUNTER — Telehealth: Payer: Self-pay | Admitting: Physical Medicine & Rehabilitation

## 2019-05-30 ENCOUNTER — Telehealth (INDEPENDENT_AMBULATORY_CARE_PROVIDER_SITE_OTHER): Payer: Medicaid Other | Admitting: Family Medicine

## 2019-05-30 ENCOUNTER — Telehealth: Payer: Self-pay | Admitting: Critical Care Medicine

## 2019-05-30 ENCOUNTER — Other Ambulatory Visit: Payer: Self-pay

## 2019-05-30 DIAGNOSIS — U071 COVID-19: Secondary | ICD-10-CM | POA: Insufficient documentation

## 2019-05-30 NOTE — Progress Notes (Signed)
[facility] Telemedicine Visit  Patient consented to have virtual visit. Method of visit: Video was attempted, but technology challenges prevented patient from using video, so visit was conducted via telephone.  Encounter participants: Patient: Tanya Owens - located at home Provider: Ellwood Dense - located at [facility] Others (if applicable): n/a  Chief Complaint: wants antibiotics  HPI:  Tested positive for COVID on 05/25/19. Symptoms started week before 1/7. Symptoms include abdominal pain. She is scared to eat as she doesn't want to make her pain worse. Is able keep down fluids, ate chicken broth today. Denies vomiting, diarrhea, chest pain, SOB, fevers.  Positive sick contacts include granddaughter.  Reports her CBGs have been within range, only on Metformin for diabetes.  She wants to know if she can get antibiotics to help treat Covid.  ROS: per HPI  Pertinent PMHx: HTN, T2DM, HLD, obesity  Exam:  Respiratory: Speaks in full sentences, no respiratory distress  Assessment/Plan:  COVID-19 Tested + 05/25/2019.  Overall doing well with mild symptoms though with decreased p.o. intake due to abdominal pain.  Explained to patient antibiotics are not adequate treatment for Covid but given her medical comorbidities and mild symptoms, would likely be a good candidate for monoclonal antibody infusion therapy through [facility], called in referral today.  Also ordered MyChart home monitoring.  -Counseled on wearing a mask, washing hands and avoiding social gatherings  -ED precautions discussed and patient expressed good understanding -Patient instructed to avoid others until they meet criteria for ending isolation after any suspected COVID, which are:  -24 hours with no fever (without use of medicaitons) and -respiratory symptoms have improved (e.g. cough, shortness of breath) or  -10 days since symptoms first appeared      Time spent during visit with patient: 10  minutes

## 2019-05-30 NOTE — Telephone Encounter (Signed)
Placed a call to Tanya Owens, regarding her call and to check on her medication. She was instructed to call office when she has a week worth of oxycodone, she verbalizes understanding. Emotional support given, she verbalizes understanding.

## 2019-05-30 NOTE — Telephone Encounter (Signed)
Patient wanted to let us know that she tested positive for COVID, her results came back on Saturday 05/27/19.  I will change her visit on 1/18 to phone visit.

## 2019-05-30 NOTE — Telephone Encounter (Signed)
I connected this patient who is Covid positive from January 7 but actually has been ill since January 3.  The patient's had symptoms for days prior to testing.  She now has ongoing weakness and fatigue however her muscle aches have resolved and she does not have shortness of breath or cough.  Unfortunately her next available infusion time would be January 15 which will be 2 days past 10-day onset of illness.  Therefore she does not qualify for monoclonal antibody infusion.  I spoke to the patient and gave her this information she took this under advisement

## 2019-05-30 NOTE — Assessment & Plan Note (Signed)
Tested + 05/25/2019.  Overall doing well with mild symptoms though with decreased p.o. intake due to abdominal pain.  Explained to patient antibiotics are not adequate treatment for Covid but given her medical comorbidities and mild symptoms, would likely be a good candidate for monoclonal antibody infusion therapy through [facility], called in referral today.  Also ordered MyChart home monitoring.  -Counseled on wearing a mask, washing hands and avoiding social gatherings  -ED precautions discussed and patient expressed good understanding -Patient instructed to avoid others until they meet criteria for ending isolation after any suspected COVID, which are:  -24 hours with no fever (without use of medicaitons) and -respiratory symptoms have improved (e.g. cough, shortness of breath) or  -10 days since symptoms first appeared

## 2019-05-30 NOTE — Telephone Encounter (Signed)
I see, thank you so much for your help!

## 2019-06-05 ENCOUNTER — Other Ambulatory Visit: Payer: Self-pay

## 2019-06-05 ENCOUNTER — Encounter: Payer: Medicaid Other | Attending: Physical Medicine & Rehabilitation | Admitting: Registered Nurse

## 2019-06-05 ENCOUNTER — Encounter: Payer: Self-pay | Admitting: Registered Nurse

## 2019-06-05 VITALS — BP 128/80 | Temp 98.6°F | Ht 69.0 in | Wt 230.0 lb

## 2019-06-05 DIAGNOSIS — M1389 Other specified arthritis, multiple sites: Secondary | ICD-10-CM | POA: Diagnosis not present

## 2019-06-05 DIAGNOSIS — R5383 Other fatigue: Secondary | ICD-10-CM

## 2019-06-05 DIAGNOSIS — Z79891 Long term (current) use of opiate analgesic: Secondary | ICD-10-CM | POA: Diagnosis not present

## 2019-06-05 DIAGNOSIS — M19011 Primary osteoarthritis, right shoulder: Secondary | ICD-10-CM | POA: Insufficient documentation

## 2019-06-05 DIAGNOSIS — M25571 Pain in right ankle and joints of right foot: Secondary | ICD-10-CM | POA: Insufficient documentation

## 2019-06-05 DIAGNOSIS — M79641 Pain in right hand: Secondary | ICD-10-CM | POA: Insufficient documentation

## 2019-06-05 DIAGNOSIS — Z87891 Personal history of nicotine dependence: Secondary | ICD-10-CM | POA: Insufficient documentation

## 2019-06-05 DIAGNOSIS — I10 Essential (primary) hypertension: Secondary | ICD-10-CM | POA: Insufficient documentation

## 2019-06-05 DIAGNOSIS — Z5181 Encounter for therapeutic drug level monitoring: Secondary | ICD-10-CM | POA: Diagnosis not present

## 2019-06-05 DIAGNOSIS — M25562 Pain in left knee: Secondary | ICD-10-CM | POA: Insufficient documentation

## 2019-06-05 DIAGNOSIS — M0609 Rheumatoid arthritis without rheumatoid factor, multiple sites: Secondary | ICD-10-CM

## 2019-06-05 DIAGNOSIS — M25561 Pain in right knee: Secondary | ICD-10-CM | POA: Insufficient documentation

## 2019-06-05 DIAGNOSIS — E119 Type 2 diabetes mellitus without complications: Secondary | ICD-10-CM | POA: Insufficient documentation

## 2019-06-05 DIAGNOSIS — G4730 Sleep apnea, unspecified: Secondary | ICD-10-CM | POA: Insufficient documentation

## 2019-06-05 DIAGNOSIS — G894 Chronic pain syndrome: Secondary | ICD-10-CM

## 2019-06-05 DIAGNOSIS — E7800 Pure hypercholesterolemia, unspecified: Secondary | ICD-10-CM | POA: Insufficient documentation

## 2019-06-05 DIAGNOSIS — M25572 Pain in left ankle and joints of left foot: Secondary | ICD-10-CM | POA: Insufficient documentation

## 2019-06-05 DIAGNOSIS — M79642 Pain in left hand: Secondary | ICD-10-CM | POA: Insufficient documentation

## 2019-06-05 MED ORDER — OXYCODONE HCL 5 MG PO TABS: 5.0000 mg | ORAL_TABLET | Freq: Three times a day (TID) | ORAL | 0 refills | Status: DC | PRN

## 2019-06-05 NOTE — Progress Notes (Signed)
Subjective:    Patient ID: Tanya Owens, female    DOB: Apr 14, 1961, 59 y.o.   MRN: [id number]  HPI: Tanya Owens is a 59 y.o. female whose appointment was changed to a virtual office visit to reduce the risk of exposure to the COVID-19 virus and to help Tanya Owens remain healthy and safe. The virtual visit will also provide continuity of care. Tanya Owens agrees with tele-health visit and verbalizes understanding. Tanya Owens reports she was diagnosed with positive  COVID-19 test on 05/25/2019. Today she reports she has abdominal cramping and has fatigue. Also states she doesn't feel well, would be specific, reviewed COVID-19 symptoms with her and she states she has called her PCP regarding the above symptoms. Denies and SOB. Reiterated for her to go to the emergency room if she experience any SOB, she verbalizes understanding.  She rated her pain 0. Her current exercise regime is walking in her home.   Tanya Owens Morphine equivalent is 22.50  MME.  Last UDS was Performed on 01/16/2019, see note for details.   Tanya Owens CMA asked the Health and History Questions. This provider and Tanya Owens verified we were speaking with the correct person using two identifiers.  Pain Inventory Average Pain 10 Pain Right Now 0 My pain is intermittent, sharp and aching  In the last 24 hours, has pain interfered with the following? General activity 0 Relation with others 0 Enjoyment of life 0 What TIME of day is your pain at its worst? varies Sleep (in general) Fair  Pain is worse with: unsure Pain improves with: medication Relief from Meds: 7  Mobility walk without assistance ability to climb steps?  yes do you drive?  yes  Function disabled: date disabled . I need assistance with the following:  meal prep and household duties  Neuro/Psych weakness  Prior Studies Any changes since last visit?  no  Physicians involved in your care Any changes since last visit?  yes Primary care  Tanya Owens   Family History  Problem Relation Age of Onset  . Heart attack Mother   . Diabetes Mother   . Hypercholesterolemia Mother   . Hypertension Mother   . Diabetes Sister   . Hypertension Brother    Social History   Socioeconomic History  . Marital status: Single    Spouse name: Not on file  . Number of children: Not on file  . Years of education: Not on file  . Highest education level: Not on file  Occupational History  . Not on file  Tobacco Use  . Smoking status: Former Smoker    Quit date: 12/27/2012    Years since quitting: 6.4  . Smokeless tobacco: Never Used  Substance and Sexual Activity  . Alcohol use: No  . Drug use: No  . Sexual activity: Yes    Birth control/protection: Post-menopausal  Other Topics Concern  . Not on file  Social History Narrative  . Not on file   Social Determinants of Health   Financial Resource Strain:   . Difficulty of Paying Living Expenses: Not on file  Food Insecurity:   . Worried About Charity fundraiser in the Last Year: Not on file  . Ran Out of Food in the Last Year: Not on file  Transportation Needs:   . Lack of Transportation (Medical): Not on file  . Lack of Transportation (Non-Medical): Not on file  Physical Activity:   . Days of Exercise per Week: Not on file  .  Minutes of Exercise per Session: Not on file  Stress:   . Feeling of Stress : Not on file  Social Connections:   . Frequency of Communication with Friends and Family: Not on file  . Frequency of Social Gatherings with Friends and Family: Not on file  . Attends Religious Services: Not on file  . Active Member of Clubs or Organizations: Not on file  . Attends Banker Meetings: Not on file  . Marital Status: Not on file   Past Surgical History:  Procedure Laterality Date  . CARDIAC CATHETERIZATION  10/18/2007   Normal coronaries, systemic hypertension   Past Medical History:  Diagnosis Date  . Arthritis 2000  . Diabetes mellitus  without complication (HCC) 2005  . Hypercholesteremia 2005  . Hypertension 2005  . Sleep apnea 2010  . Sleep disorder    BP 128/80 Comment: pt reported, virtual visit  Temp 98.6 F (37 C) Comment: pt reported, virtual visit  Ht 5\' 9"  (1.753 m) Comment: pt reported, virtual visit  Wt 230 lb (104.3 kg) Comment: pt reported, virtual visit  SpO2 100% Comment: pt reported, virtual visit  BMI 33.97 kg/m   Opioid Risk Score:   Fall Risk Score:  `1  Depression screen PHQ 2/9  Depression screen [facility] 2/9 04/19/2019 09/22/2018 08/15/2018 05/20/2018 03/10/2018 12/13/2017 08/16/2017  Decreased Interest 0 0 0 0 0 0 0  Down, Depressed, Hopeless 0 0 0 0 0 0 0  PHQ - 2 Score 0 0 0 0 0 0 0    Review of Systems  Constitutional: Positive for fatigue and unexpected weight change.  HENT: Positive for rhinorrhea.   Gastrointestinal: Positive for abdominal pain.  Neurological: Positive for weakness.  All other systems reviewed and are negative.      Objective:   Physical Exam Vitals and nursing note reviewed.  Musculoskeletal:     Comments: No Physical Exam: Virtual Visit           Assessment & Plan:  1. 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 Chronic Pain Syndrome:Refilled: Oxycodone 5 mg one tabletthree times aday as needed. #90.06/05/2019. We will continue the opioid monitoring program, this consists of regular clinic visits, examinations, urine drug screen, pill counts as well as use of 06[place]07/[place] Controlled Substance Reporting system. 4. ChronicBilateralShoulder Pain:No complaints today.Continue to Alternate Heat and Ice Therapy. Continue HEP as Tolerated. Continue to Monitor.06/05/2019 5.BilateralHandsOA R>L: No complaints today. Continue current medication regimen. Continue to Monitor.06/05/2019 6. Neuropathic pain Left Hand and Left Foot: No complaints today. Continue  to Monitor.  ContinuePamelor.06/05/2019. 7. Fatigue: Diagnosed with COVID on 05/25/2019, she reports. PCP Following. Continue to Monitor.   F/U in 1 month  Telephone Call Location of patient:In her Home Location of provider: Office Established patient Time spent on call:10 Minutes

## 2019-06-09 ENCOUNTER — Encounter: Payer: Medicaid Other | Admitting: Gastroenterology

## 2019-06-23 ENCOUNTER — Telehealth: Payer: Self-pay | Admitting: *Deleted

## 2019-06-23 NOTE — Telephone Encounter (Signed)
Unable to reach patient , no voicemail.  Missed pre-visit.  Canceled colonoscopy. Letter sent.

## 2019-07-05 ENCOUNTER — Telehealth: Payer: Self-pay | Admitting: Registered Nurse

## 2019-07-05 MED ORDER — OXYCODONE HCL 5 MG PO TABS: 5.0000 mg | ORAL_TABLET | Freq: Three times a day (TID) | ORAL | 0 refills | Status: DC | PRN

## 2019-07-05 NOTE — Telephone Encounter (Signed)
PMP was Reviewed. Last Oxycodone filled on 06/05/2019. Oxycodone e-scribed this evening due to potential storm on 07/06/2019.

## 2019-07-06 ENCOUNTER — Encounter: Payer: Medicaid Other | Attending: Physical Medicine & Rehabilitation | Admitting: Registered Nurse

## 2019-07-06 ENCOUNTER — Other Ambulatory Visit: Payer: Self-pay

## 2019-07-06 ENCOUNTER — Encounter: Payer: Self-pay | Admitting: Registered Nurse

## 2019-07-06 VITALS — Ht 69.0 in | Wt 232.0 lb

## 2019-07-06 DIAGNOSIS — M19011 Primary osteoarthritis, right shoulder: Secondary | ICD-10-CM | POA: Insufficient documentation

## 2019-07-06 DIAGNOSIS — I10 Essential (primary) hypertension: Secondary | ICD-10-CM | POA: Insufficient documentation

## 2019-07-06 DIAGNOSIS — Z79891 Long term (current) use of opiate analgesic: Secondary | ICD-10-CM | POA: Diagnosis not present

## 2019-07-06 DIAGNOSIS — M25572 Pain in left ankle and joints of left foot: Secondary | ICD-10-CM | POA: Insufficient documentation

## 2019-07-06 DIAGNOSIS — E119 Type 2 diabetes mellitus without complications: Secondary | ICD-10-CM | POA: Insufficient documentation

## 2019-07-06 DIAGNOSIS — M1389 Other specified arthritis, multiple sites: Secondary | ICD-10-CM | POA: Diagnosis not present

## 2019-07-06 DIAGNOSIS — M25571 Pain in right ankle and joints of right foot: Secondary | ICD-10-CM | POA: Insufficient documentation

## 2019-07-06 DIAGNOSIS — M25561 Pain in right knee: Secondary | ICD-10-CM | POA: Insufficient documentation

## 2019-07-06 DIAGNOSIS — G8929 Other chronic pain: Secondary | ICD-10-CM

## 2019-07-06 DIAGNOSIS — M25512 Pain in left shoulder: Secondary | ICD-10-CM | POA: Diagnosis not present

## 2019-07-06 DIAGNOSIS — M170 Bilateral primary osteoarthritis of knee: Secondary | ICD-10-CM

## 2019-07-06 DIAGNOSIS — G894 Chronic pain syndrome: Secondary | ICD-10-CM

## 2019-07-06 DIAGNOSIS — E7800 Pure hypercholesterolemia, unspecified: Secondary | ICD-10-CM | POA: Insufficient documentation

## 2019-07-06 DIAGNOSIS — M19042 Primary osteoarthritis, left hand: Secondary | ICD-10-CM | POA: Diagnosis not present

## 2019-07-06 DIAGNOSIS — Z5181 Encounter for therapeutic drug level monitoring: Secondary | ICD-10-CM | POA: Diagnosis not present

## 2019-07-06 DIAGNOSIS — M79641 Pain in right hand: Secondary | ICD-10-CM | POA: Insufficient documentation

## 2019-07-06 DIAGNOSIS — G4730 Sleep apnea, unspecified: Secondary | ICD-10-CM | POA: Insufficient documentation

## 2019-07-06 DIAGNOSIS — M79642 Pain in left hand: Secondary | ICD-10-CM | POA: Insufficient documentation

## 2019-07-06 DIAGNOSIS — M19041 Primary osteoarthritis, right hand: Secondary | ICD-10-CM | POA: Diagnosis not present

## 2019-07-06 DIAGNOSIS — M0609 Rheumatoid arthritis without rheumatoid factor, multiple sites: Secondary | ICD-10-CM

## 2019-07-06 DIAGNOSIS — M25562 Pain in left knee: Secondary | ICD-10-CM | POA: Insufficient documentation

## 2019-07-06 DIAGNOSIS — Z87891 Personal history of nicotine dependence: Secondary | ICD-10-CM | POA: Insufficient documentation

## 2019-07-06 NOTE — Progress Notes (Signed)
Subjective:    Patient ID: Tanya Owens, female    DOB: [birth year], 59 y.o.   MRN: [id number]  HPI: Tanya Owens is a 59 y.o. female whose appointment was changed to a virtual office visit to reduce the risk of exposure to the COVID-19 virus and to help Tanya Owens remain healthy and safe. The virtual visit will also provide continuity of care. Tanya Owens agrees with virtual visit and verbalizes understanding. She states her pain is located in her left shoulder, left wrist, and bilateral knees. Also reports generalized joint pain. She rates her pain 9. Her current exercise regime is walking and performing stretching exercises.  Tanya Owens Morphine equivalent is 22.50  MME.  Last UDS was Performed on 01/16/2019, see note for details.   Barbee Shropshire CMA asked The Health and History Questions. This provider and Barbee Shropshire verified we were speaking with the correct person using two identifiers.     Pain Inventory Average Pain 6 Pain Right Now 9 My pain is sharp, stabbing and aching  In the last 24 hours, has pain interfered with the following? General activity 8 Relation with others 9 Enjoyment of life 9 What TIME of day is your pain at its worst? night Sleep (in general) Fair  Pain is worse with: unsure Pain improves with: rest and medication Relief from Meds: 10  Mobility walk without assistance ability to climb steps?  yes do you drive?  yes  Function disabled: date disabled .  Neuro/Psych numbness tingling  Prior Studies Any changes since last visit?  no  Physicians involved in your care Any changes since last visit?  no   Family History  Problem Relation Age of Onset  . Heart attack Mother   . Diabetes Mother   . Hypercholesterolemia Mother   . Hypertension Mother   . Diabetes Sister   . Hypertension Brother    Social History   Socioeconomic History  . Marital status: Single    Spouse name: Not on file  . Number of children: Not on file  .  Years of education: Not on file  . Highest education level: Not on file  Occupational History  . Not on file  Tobacco Use  . Smoking status: Former Smoker    Quit date: 12/27/2012    Years since quitting: 6.5  . Smokeless tobacco: Never Used  Substance and Sexual Activity  . Alcohol use: No  . Drug use: No  . Sexual activity: Yes    Birth control/protection: Post-menopausal  Other Topics Concern  . Not on file  Social History Narrative  . Not on file   Social Determinants of Health   Financial Resource Strain:   . Difficulty of Paying Living Expenses: Not on file  Food Insecurity:   . Worried About Programme researcher, broadcasting/film/video in the Last Year: Not on file  . Ran Out of Food in the Last Year: Not on file  Transportation Needs:   . Lack of Transportation (Medical): Not on file  . Lack of Transportation (Non-Medical): Not on file  Physical Activity:   . Days of Exercise per Week: Not on file  . Minutes of Exercise per Session: Not on file  Stress:   . Feeling of Stress : Not on file  Social Connections:   . Frequency of Communication with Friends and Family: Not on file  . Frequency of Social Gatherings with Friends and Family: Not on file  . Attends Religious Services: Not on file  .  Active Member of Clubs or Organizations: Not on file  . Attends Archivist Meetings: Not on file  . Marital Status: Not on file   Past Surgical History:  Procedure Laterality Date  . CARDIAC CATHETERIZATION  10/18/2007   Normal coronaries, systemic hypertension   Past Medical History:  Diagnosis Date  . Arthritis 2000  . Diabetes mellitus without complication ([place]) 7124  . Hypercholesteremia 2005  . Hypertension 2005  . Sleep apnea 2010  . Sleep disorder    There were no vitals taken for this visit.  Opioid Risk Score:   Fall Risk Score:  `1  Depression screen PHQ 2/9  Depression screen [facility] 2/9 04/19/2019 09/22/2018 08/15/2018 05/20/2018 03/10/2018 12/13/2017 08/16/2017  Decreased  Interest 0 0 0 0 0 0 0  Down, Depressed, Hopeless 0 0 0 0 0 0 0  PHQ - 2 Score 0 0 0 0 0 0 0     Review of Systems  Constitutional: Negative.   HENT: Negative.   Eyes: Negative.   Respiratory: Negative.   Cardiovascular: Negative.   Gastrointestinal: Negative.   Endocrine: Negative.   Genitourinary: Negative.   Musculoskeletal: Positive for arthralgias.  Skin: Negative.   Allergic/Immunologic: Negative.   Neurological: Positive for numbness.  Hematological: Negative.   Psychiatric/Behavioral: Negative.   All other systems reviewed and are negative.      Objective:   Physical Exam Vitals and nursing note reviewed.  Musculoskeletal:     Comments: No Physical Exam Performed: Virtual Visit           Assessment & Plan:  1. 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 Chronic Pain Syndrome:Refilled: Oxycodone 5 mg one tabletthree times aday as needed. #90.07/06/2019. We will continue the opioid monitoring program, this consists of regular clinic visits, examinations, urine drug screen, pill counts as well as use of [place] Controlled Substance Reporting system. 4. Left Shoulder Pain:Continue to Alternate Heat and Ice Therapy. Continue HEP as Tolerated. Continue to Monitor.07/06/2019 5.BilateralHandsOA R>L: . Continue current medication regimen. Continue to Monitor.07/06/2019 6. Neuropathic pain Left Hand and Left Foot: No complaints today. Continue to Monitor. 07/06/2019.  F/U in 1 month  Telephone Call Location of patient:In her Home Location of provider: In her Home Established patient Time spent on call:10 Minutes

## 2019-07-07 ENCOUNTER — Encounter: Payer: Medicaid Other | Admitting: Gastroenterology

## 2019-07-17 ENCOUNTER — Ambulatory Visit
Admission: EM | Admit: 2019-07-17 | Discharge: 2019-07-17 | Discharge status: Absent without leave | Payer: Medicaid Other

## 2019-07-17 DIAGNOSIS — Z79899 Other long term (current) drug therapy: Secondary | ICD-10-CM | POA: Diagnosis not present

## 2019-07-17 DIAGNOSIS — M0579 Rheumatoid arthritis with rheumatoid factor of multiple sites without organ or systems involvement: Secondary | ICD-10-CM | POA: Diagnosis not present

## 2019-07-17 NOTE — ED Triage Notes (Signed)
Pt requesting a covid test. States was exposed on 2/22. Denies sx's. States needs a neg test for work. Pt educated we do not repeat if positive COVID within 90days.

## 2019-07-31 ENCOUNTER — Encounter: Payer: Medicaid Other | Attending: Physical Medicine & Rehabilitation | Admitting: Registered Nurse

## 2019-07-31 ENCOUNTER — Other Ambulatory Visit: Payer: Self-pay

## 2019-07-31 ENCOUNTER — Encounter: Payer: Self-pay | Admitting: Registered Nurse

## 2019-07-31 VITALS — BP 139/88 | HR 90 | Temp 97.7°F | Ht 69.0 in | Wt 239.0 lb

## 2019-07-31 DIAGNOSIS — M1712 Unilateral primary osteoarthritis, left knee: Secondary | ICD-10-CM | POA: Diagnosis not present

## 2019-07-31 DIAGNOSIS — Z5181 Encounter for therapeutic drug level monitoring: Secondary | ICD-10-CM | POA: Insufficient documentation

## 2019-07-31 DIAGNOSIS — M25562 Pain in left knee: Secondary | ICD-10-CM | POA: Insufficient documentation

## 2019-07-31 DIAGNOSIS — M1389 Other specified arthritis, multiple sites: Secondary | ICD-10-CM | POA: Insufficient documentation

## 2019-07-31 DIAGNOSIS — M19011 Primary osteoarthritis, right shoulder: Secondary | ICD-10-CM | POA: Insufficient documentation

## 2019-07-31 DIAGNOSIS — M25572 Pain in left ankle and joints of left foot: Secondary | ICD-10-CM | POA: Insufficient documentation

## 2019-07-31 DIAGNOSIS — E119 Type 2 diabetes mellitus without complications: Secondary | ICD-10-CM | POA: Diagnosis not present

## 2019-07-31 DIAGNOSIS — G4730 Sleep apnea, unspecified: Secondary | ICD-10-CM | POA: Insufficient documentation

## 2019-07-31 DIAGNOSIS — I10 Essential (primary) hypertension: Secondary | ICD-10-CM | POA: Diagnosis not present

## 2019-07-31 DIAGNOSIS — M79642 Pain in left hand: Secondary | ICD-10-CM | POA: Insufficient documentation

## 2019-07-31 DIAGNOSIS — M25561 Pain in right knee: Secondary | ICD-10-CM | POA: Diagnosis not present

## 2019-07-31 DIAGNOSIS — G894 Chronic pain syndrome: Secondary | ICD-10-CM | POA: Insufficient documentation

## 2019-07-31 DIAGNOSIS — E7800 Pure hypercholesterolemia, unspecified: Secondary | ICD-10-CM | POA: Insufficient documentation

## 2019-07-31 DIAGNOSIS — M79641 Pain in right hand: Secondary | ICD-10-CM | POA: Insufficient documentation

## 2019-07-31 DIAGNOSIS — M25571 Pain in right ankle and joints of right foot: Secondary | ICD-10-CM | POA: Diagnosis not present

## 2019-07-31 DIAGNOSIS — M1711 Unilateral primary osteoarthritis, right knee: Secondary | ICD-10-CM | POA: Diagnosis not present

## 2019-07-31 DIAGNOSIS — Z87891 Personal history of nicotine dependence: Secondary | ICD-10-CM | POA: Insufficient documentation

## 2019-07-31 DIAGNOSIS — Z79891 Long term (current) use of opiate analgesic: Secondary | ICD-10-CM | POA: Insufficient documentation

## 2019-07-31 DIAGNOSIS — M0609 Rheumatoid arthritis without rheumatoid factor, multiple sites: Secondary | ICD-10-CM

## 2019-07-31 MED ORDER — OXYCODONE HCL 5 MG PO TABS: 5.0000 mg | ORAL_TABLET | Freq: Three times a day (TID) | ORAL | 0 refills | Status: DC | PRN

## 2019-07-31 NOTE — Progress Notes (Signed)
Subjective:    Patient ID: Tanya Owens, female    DOB: Aug 28, 1960, 59 y.o.   MRN: [id number]  HPI: Tanya Owens is a 59 y.o. female who returns for follow up appointment for chronic pain and medication refill. She states her pain is located in her bilateral knees, also reports increase intensity of knee pain requesting a Xray. She rates her pain 10. Her current exercise regime is walking   Ms. Priebe Morphine equivalent is 22.50  MME.  UDS ordered today.    Pain Inventory Average Pain 10 Pain Right Now 10 My pain is intermittent and sharp  In the last 24 hours, has pain interfered with the following? General activity 0 Relation with others 0 Enjoyment of life 0 What TIME of day is your pain at its worst? always Sleep (in general) Poor  Pain is worse with: n/a Pain improves with: nothing Relief from Meds: 0  Mobility walk without assistance  Function disabled: date disabled .  Neuro/Psych No problems in this area  Prior Studies Any changes since last visit?  no  Physicians involved in your care    Family History  Problem Relation Age of Onset  . Heart attack Mother   . Diabetes Mother   . Hypercholesterolemia Mother   . Hypertension Mother   . Diabetes Sister   . Hypertension Brother    Social History   Socioeconomic History  . Marital status: Single    Spouse name: Not on file  . Number of children: Not on file  . Years of education: Not on file  . Highest education level: Not on file  Occupational History  . Not on file  Tobacco Use  . Smoking status: Former Smoker    Quit date: 12/27/2012    Years since quitting: 6.5  . Smokeless tobacco: Never Used  Substance and Sexual Activity  . Alcohol use: No  . Drug use: No  . Sexual activity: Yes    Birth control/protection: Post-menopausal  Other Topics Concern  . Not on file  Social History Narrative  . Not on file   Social Determinants of Health   Financial Resource Strain:   .  Difficulty of Paying Living Expenses:   Food Insecurity:   . Worried About Charity fundraiser in the Last Year:   . Arboriculturist in the Last Year:   Transportation Needs:   . Film/video editor (Medical):   Marland Kitchen Lack of Transportation (Non-Medical):   Physical Activity:   . Days of Exercise per Week:   . Minutes of Exercise per Session:   Stress:   . Feeling of Stress :   Social Connections:   . Frequency of Communication with Friends and Family:   . Frequency of Social Gatherings with Friends and Family:   . Attends Religious Services:   . Active Member of Clubs or Organizations:   . Attends Archivist Meetings:   Marland Kitchen Marital Status:    Past Surgical History:  Procedure Laterality Date  . CARDIAC CATHETERIZATION  10/18/2007   Normal coronaries, systemic hypertension   Past Medical History:  Diagnosis Date  . Arthritis 2000  . Diabetes mellitus without complication ([place]) 5852  . Hypercholesteremia 2005  . Hypertension 2005  . Sleep apnea 2010  . Sleep disorder    BP 139/88   Pulse 90   Temp 97.7 F (36.5 C)   Ht 5\' 9"  (1.753 m)   Wt 239 lb (108.4 kg)  SpO2 98%   BMI 35.29 kg/m   Opioid Risk Score:   Fall Risk Score:  `1  Depression screen PHQ 2/9  Depression screen [facility] 2/9 04/19/2019 09/22/2018 08/15/2018 05/20/2018 03/10/2018 12/13/2017 08/16/2017  Decreased Interest 0 0 0 0 0 0 0  Down, Depressed, Hopeless 0 0 0 0 0 0 0  PHQ - 2 Score 0 0 0 0 0 0 0    Review of Systems  Constitutional: Negative.   HENT: Negative.   Eyes: Negative.   Respiratory: Negative.   Cardiovascular: Negative.   Gastrointestinal: Negative.   Endocrine: Negative.   Genitourinary: Negative.   Musculoskeletal: Positive for arthralgias.  Skin: Negative.   Allergic/Immunologic: Negative.   Neurological: Negative.   Hematological: Negative.   Psychiatric/Behavioral: Negative.   All other systems reviewed and are negative.      Objective:   Physical Exam Vitals and  nursing note reviewed.  Constitutional:      Appearance: Normal appearance.  Cardiovascular:     Rate and Rhythm: Normal rate and regular rhythm.     Pulses: Normal pulses.     Heart sounds: Normal heart sounds.  Pulmonary:     Effort: Pulmonary effort is normal.     Breath sounds: Normal breath sounds.  Musculoskeletal:     Cervical back: Normal range of motion and neck supple.     Comments: Normal Muscle Bulk and Muscle Testing Reveals:  Upper Extremities: Full ROM and Muscle Strength 5/5 Lower Extremities: Full ROM and Muscle Strength 5/5 Left Patella with Crepitus Noted Arises from Chair slowly Antalgic Gait   Skin:    General: Skin is warm and dry.  Neurological:     Mental Status: She is alert and oriented to person, place, and time.  Psychiatric:        Mood and Affect: Mood normal.        Behavior: Behavior normal.           Assessment & Plan:  1. 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 Continue with Voltaren Gel.07/31/2019 3. Chronic Pain Syndrome:Refilled:Oxycodone 5 mg one tabletthree times aday as needed. #90.07/31/2019. We will continue the opioid monitoring program, this consists of regular clinic visits, examinations, urine drug screen, pill counts as well as use of [place] Controlled Substance Reporting system. 4. Left Shoulder Pain:No Complaints Today. Continue to Alternate Heat and Ice Therapy. Continue HEP as Tolerated. Continue to Monitor.07/31/2019 5.BilateralHandsOA R>L:.Continue current medication regimen. Continue to Monitor.07/31/2019 6. Neuropathic pain Left Hand and Left Foot:No complaints today. Continue to Monitor.07/31/2019.  F/U in 1 month  15 minutes of face to face patient care time was spent during this visit. All questions were encouraged and answered.

## 2019-08-02 LAB — TOXASSURE SELECT,+ANTIDEPR,UR

## 2019-08-03 ENCOUNTER — Other Ambulatory Visit: Payer: Self-pay | Admitting: *Deleted

## 2019-08-03 ENCOUNTER — Telehealth: Payer: Self-pay | Admitting: Registered Nurse

## 2019-08-03 DIAGNOSIS — I10 Essential (primary) hypertension: Secondary | ICD-10-CM

## 2019-08-03 MED ORDER — HYDROCHLOROTHIAZIDE 25 MG PO TABS: 25.0000 mg | ORAL_TABLET | Freq: Every day | ORAL | 1 refills | Status: DC

## 2019-08-03 MED ORDER — LISINOPRIL 40 MG PO TABS: 40.0000 mg | ORAL_TABLET | Freq: Every day | ORAL | 3 refills | Status: DC

## 2019-08-03 NOTE — Telephone Encounter (Signed)
Placed a call to Tanya Owens regarding her UDS results. She admits to having a glass of wine at her grandson's birthday party. She's adamant she has never used cocaine, we discussed the narcotic policy and she will be discharged from our office due to her UDS results. She verbalizes understanding.  We discussed the weaning of her Oxycodone for 10 days three times a day as needed for pain 10 days to decrease oxycodone to twice a day as needed for pain.  10 days to decrease Oxycodone to daily then discontinued, she verbalizes understanding.

## 2019-08-14 ENCOUNTER — Ambulatory Visit
Admission: RE | Admit: 2019-08-14 | Discharge: 2019-08-14 | Disposition: A | Discharge status: Home / Self Care | Payer: Medicaid Other | Source: Ambulatory Visit | Attending: Registered Nurse | Admitting: Registered Nurse

## 2019-08-14 ENCOUNTER — Other Ambulatory Visit: Payer: Self-pay

## 2019-08-14 ENCOUNTER — Encounter: Payer: Self-pay | Admitting: Family Medicine

## 2019-08-14 ENCOUNTER — Ambulatory Visit (INDEPENDENT_AMBULATORY_CARE_PROVIDER_SITE_OTHER): Payer: Medicaid Other | Admitting: Family Medicine

## 2019-08-14 VITALS — BP 160/80 | HR 86 | Temp 97.9°F | Wt 240.0 lb

## 2019-08-14 DIAGNOSIS — Z79899 Other long term (current) drug therapy: Secondary | ICD-10-CM | POA: Diagnosis not present

## 2019-08-14 DIAGNOSIS — I10 Essential (primary) hypertension: Secondary | ICD-10-CM | POA: Diagnosis not present

## 2019-08-14 DIAGNOSIS — G8929 Other chronic pain: Secondary | ICD-10-CM

## 2019-08-14 DIAGNOSIS — M1711 Unilateral primary osteoarthritis, right knee: Secondary | ICD-10-CM | POA: Diagnosis not present

## 2019-08-14 DIAGNOSIS — M1712 Unilateral primary osteoarthritis, left knee: Secondary | ICD-10-CM | POA: Diagnosis not present

## 2019-08-14 DIAGNOSIS — E119 Type 2 diabetes mellitus without complications: Secondary | ICD-10-CM

## 2019-08-14 LAB — POCT GLYCOSYLATED HEMOGLOBIN (HGB A1C): HbA1c, POC (controlled diabetic range): 6 % (ref 0.0–7.0)

## 2019-08-14 NOTE — Assessment & Plan Note (Signed)
Assessment: Patient with chronic arthritis pain who is previously been managed by pain clinic using narcotics.  Patient unfortunately was discharged from this clinic upon a drug screen recently which was positive for alcohol and cocaine. Plan: -Discussed with patient the fact that while I cannot provide another referral for pain clinic it may be challenging for her to find another clinic after being discharged from her previous one for positive cocaine on drug screen. -Discussed with patient that as she has x-rays pending for her chronic knee pain a referral for orthopedics once these results may be warranted and may help provide her some pain relief. -Patient states she also has a list of pain clinics that was provided to her by her previous clinic once they discharged her which she plans to call

## 2019-08-14 NOTE — Assessment & Plan Note (Signed)
Assessment: Elevated blood pressure on encounter today, has been controlled during previous encounters. Plan: -Patient plans to purchase a blood pressure cuff and measure her blood pressures twice per day for the next 2 weeks and send all of these to me either via phone call to the clinic or via MyChart.  During this point we can determine if additional blood pressure medication is needed -Continue on amlodipine, lisinopril, hydrochlorothiazide at this time.

## 2019-08-14 NOTE — Progress Notes (Signed)
   Subjective:    Patient ID: Tanya Owens, female    DOB: 05-06-1961, 59 y.o.   MRN: [id number]   CC: Physical  HPI:  Diabetes: Has been exercising via walking and stretching. Walks twice per day around the block of her building. States she has been increasing her vegetables dramatically and reducing her fats/meats.  Hypertension: During previous appointments patient has had normal blood pressure, however blood pressure is elevated during appointment today.  Patient states she does not have a blood pressure cuff but would be willing to purchase one in order to check her pressures outside the hospital.  Arthritis: Recently discharged from her pain center after finding cocaine and alcohol on her drug test. She admits to having a glass of alcohol but states she has never used cocaine. Wants referral for other pain clinic. Has also seen orthopedics and had x-rays of her knees done today but has not yet had the results.  ROS: pertinent noted in the HPI   Pertinent PMH, PSH, FH, SoHx: Past medical history significant for arthritis  Smoking status - Former smoker  Objective:  BP (!) 160/80   Pulse 86   Temp 97.9 F (36.6 C) (Oral)   Wt 240 lb (108.9 kg)   SpO2 98%   BMI 35.44 kg/m   BP recheck 154/82  Vitals and nursing note reviewed  General: NAD, pleasant, able to participate in exam Cardiac: RRR, normal heart sounds, no murmurs. Respiratory: CTAB, normal effort Extremities: no edema or cyanosis. Skin: warm and dry, no rashes noted Neuro: alert, no obvious focal deficits Psych: Normal affect and mood   Assessment & Plan:    HTN (hypertension) Assessment: Elevated blood pressure on encounter today, has been controlled during previous encounters. Plan: -Patient plans to purchase a blood pressure cuff and measure her blood pressures twice per day for the next 2 weeks and send all of these to me either via phone call to the clinic or via MyChart.  During this point we  can determine if additional blood pressure medication is needed -Continue on amlodipine, lisinopril, hydrochlorothiazide at this time.  Other chronic pain Assessment: Patient with chronic arthritis pain who is previously been managed by pain clinic using narcotics.  Patient unfortunately was discharged from this clinic upon a drug screen recently which was positive for alcohol and cocaine. Plan: -Discussed with patient the fact that while I cannot provide another referral for pain clinic it may be challenging for her to find another clinic after being discharged from her previous one for positive cocaine on drug screen. -Discussed with patient that as she has x-rays pending for her chronic knee pain a referral for orthopedics once these results may be warranted and may help provide her some pain relief. -Patient states she also has a list of pain clinics that was provided to her by her previous clinic once they discharged her which she plans to call   Type 2 diabetes mellitus without complication Assessment: Well-controlled diabetes Plan: -Continue current medications with plan A1c recheck in 3 months.  Current A1c 6.0. -If A1c continues to do well on recheck can consider reducing some diabetic medications.    Jackelyn Poling, DO [facility] PGY-1

## 2019-08-14 NOTE — Patient Instructions (Addendum)
It was great to see you!  Our plans for today:  - Once your knee xrays come back we will consider doing a referral for orthopedics  - I have sent a referral for pain management/pain clinics. Since you have a list provided by your previous clinic I would recommend calling each of them and explaining the situation and telling them that your primary doctor has sent in a referral. - I would like for you to get a blood pressure cuff and for two weeks I would like for you to check your pressures twice per day, you can then either call and leave a message for me, send them to me via MyChart, or make another appointment in 2 weeks to discuss if we should be starting another blood pressure medicine. - Your diabetes is doing very well, your A1C today is 6.0! Keep up the great work with your diet and exercise!  Take care and seek immediate care sooner if you develop any concerns.   Dr. [facility]

## 2019-08-14 NOTE — Assessment & Plan Note (Signed)
Assessment: Well-controlled diabetes Plan: -Continue current medications with plan A1c recheck in 3 months.  Current A1c 6.0. -If A1c continues to do well on recheck can consider reducing some diabetic medications.

## 2019-08-15 ENCOUNTER — Telehealth: Payer: Self-pay | Admitting: Family Medicine

## 2019-08-15 NOTE — Telephone Encounter (Signed)
[facility] reports the patient was dismissed from their care on 08/03/19

## 2019-08-15 NOTE — Telephone Encounter (Signed)
Will send PCP as an FYI.  Javan Gonzaga,CMA

## 2019-08-31 ENCOUNTER — Ambulatory Visit: Payer: Medicaid Other | Admitting: Registered Nurse

## 2019-09-26 ENCOUNTER — Encounter: Payer: Self-pay | Admitting: Family Medicine

## 2019-09-26 ENCOUNTER — Other Ambulatory Visit: Payer: Self-pay

## 2019-09-26 DIAGNOSIS — I10 Essential (primary) hypertension: Secondary | ICD-10-CM

## 2019-09-26 MED ORDER — LISINOPRIL 40 MG PO TABS: 40.0000 mg | ORAL_TABLET | Freq: Every day | ORAL | 3 refills | Status: DC

## 2019-09-26 MED ORDER — HYDROCHLOROTHIAZIDE 25 MG PO TABS: 25.0000 mg | ORAL_TABLET | Freq: Every day | ORAL | 1 refills | Status: DC

## 2019-09-26 NOTE — Telephone Encounter (Signed)
Patient calls nurse line regarding knee X-Ray. Patient states that she is still experiencing pain in both knees. Please advise next steps  To PCP  Veronda Prude, RN

## 2019-09-26 NOTE — Telephone Encounter (Signed)
Patient called and scheduled for office visit on Thursday 5/13.  Veronda Prude, RN

## 2019-09-26 NOTE — Telephone Encounter (Signed)
Will have patient schedule appointment to further discuss options for her knee pain.

## 2019-09-28 ENCOUNTER — Ambulatory Visit (INDEPENDENT_AMBULATORY_CARE_PROVIDER_SITE_OTHER): Payer: Medicaid Other | Admitting: Family Medicine

## 2019-09-28 ENCOUNTER — Other Ambulatory Visit: Payer: Self-pay

## 2019-09-28 ENCOUNTER — Encounter: Payer: Self-pay | Admitting: Family Medicine

## 2019-09-28 VITALS — BP 110/70 | HR 84 | Ht 69.0 in | Wt 241.0 lb

## 2019-09-28 DIAGNOSIS — M25562 Pain in left knee: Secondary | ICD-10-CM | POA: Diagnosis not present

## 2019-09-28 DIAGNOSIS — G8929 Other chronic pain: Secondary | ICD-10-CM

## 2019-09-28 DIAGNOSIS — Z1231 Encounter for screening mammogram for malignant neoplasm of breast: Secondary | ICD-10-CM | POA: Diagnosis not present

## 2019-09-28 DIAGNOSIS — M25561 Pain in right knee: Secondary | ICD-10-CM | POA: Diagnosis not present

## 2019-09-28 NOTE — Progress Notes (Signed)
    SUBJECTIVE:   CHIEF COMPLAINT / HPI:   Chronic knee pain Patient returns for follow-up of chronic knee pain.  At previous appointment patient was sent for x-rays which showed mild degenerative changes consistent with osteoarthritis.  Patient endorses ongoing knee pain which she has had for months.  No trauma preceded this knee pain and knee pain does not seem to be getting better or worse.  Patient has tried physical therapy and continues to practice physical therapy exercises at home.  PERTINENT  PMH / PSH: Past medical history significant for chronic pain, previously discharged from pain clinic for positive drug screen for cocaine.  OBJECTIVE:   BP 110/70   Pulse 84   Ht 5\' 9"  (1.753 m)   Wt 241 lb (109.3 kg)   SpO2 100%   BMI 35.59 kg/m    General: NAD, pleasant, able to participate in exam Respiratory: Normal effort, no respiratory distress MSK: Right and left knee with no swelling noted, strength 5/5 bilaterally in knee flexion and extension.  No pain with knee flexion and extension.  No pain with palpation of the antero-lateral and antero-medial joint spaces.  Patient does have some increased hypertonicity of the lower hamstrings bilaterally.  With some discomfort on passive stretching of the hamstrings   ASSESSMENT/PLAN:   Bilateral chronic knee pain Assessment: Chronic bilateral knee pain with x-ray evidence of mild degenerative changes consistent with osteoarthritis. Plan: -Discussed with patient the progression of treatment modalities for osteoarthritis of the knees including acetaminophen, NSAID/Voltaren, physical therapy.  Progressing to joint injections as osteoarthritis worsens, progressing to knee replacement if osteoarthritis becomes severe. -Patient plans to continue working on physical therapy and stretching exercises as well as continuing to use acetaminophen as needed -Provided patient with some stretching exercises for her hamstrings as she has some  hypertonicity and tightness in her hamstrings on physical exam   - Has referral for colonoscopy. -Provide referral for mammogram  , DO [facility]

## 2019-09-28 NOTE — Assessment & Plan Note (Signed)
Assessment: Chronic bilateral knee pain with x-ray evidence of mild degenerative changes consistent with osteoarthritis. Plan: -Discussed with patient the progression of treatment modalities for osteoarthritis of the knees including acetaminophen, NSAID/Voltaren, physical therapy.  Progressing to joint injections as osteoarthritis worsens, progressing to knee replacement if osteoarthritis becomes severe. -Patient plans to continue working on physical therapy and stretching exercises as well as continuing to use acetaminophen as needed -Provided patient with some stretching exercises for her hamstrings as she has some hypertonicity and tightness in her hamstrings on physical exam

## 2019-09-28 NOTE — Patient Instructions (Addendum)
It was great to see you!  Our plans for today:  -Today we discussed your chronic knee pain, including typical progression of osteoarthritis and various treatment modalities we can pursue for this.  At this point the level of osteoarthritis appears to be mild per your x-rays and so we recommend continue with physical therapy as well as Tylenol as needed for pain. -We can always consider joint injection in the future if your pain gets worse or referral for orthopedics to look at knee replacement if and when that becomes appropriate. -I have sent in a referral for your colonoscopy -I am sending a referral for your mammogram  Take care and seek immediate care sooner if you develop any concerns.   Dr. [facility]    Chronic Knee Pain, Adult Chronic knee pain is pain in one or both knees that lasts longer than 3 months. Symptoms of chronic knee pain may include swelling, stiffness, and discomfort. Age-related wear and tear (osteoarthritis) of the knee joint is the most common cause of chronic knee pain. Other possible causes include:  A long-term immune-related disease that causes inflammation of the knee (rheumatoid arthritis). This usually affects both knees.  Inflammatory arthritis, such as gout or pseudogout.  An injury to the knee that causes arthritis.  An injury to the knee that damages the ligaments. Ligaments are strong tissues that connect bones to each other.  Runner's knee or pain behind the kneecap. Treatment for chronic knee pain depends on the cause. The main treatments for chronic knee pain are physical therapy and weight loss. This condition may also be treated with medicines, injections, a knee sleeve or brace, and by using crutches. Rest, ice, compression (pressure), and elevation (RICE) therapy may also be recommended. Follow these instructions at home: If you have a knee sleeve or brace:   Wear it as told by your health care provider. Remove it only as  told by your health care provider.  Loosen it if your toes tingle, become numb, or turn cold and blue.  Keep it clean.  If the sleeve or brace is not waterproof: ? Do not let it get wet. ? Remove it if allowed by your health care provider, or cover it with a watertight covering when you take a bath or a shower. Managing pain, stiffness, and swelling      If directed, apply heat to the affected area as often as told by your health care provider. Use the heat source that your health care provider recommends, such as a moist heat pack or a heating pad. ? If you have a removable sleeve or brace, remove it as told by your health care provider. ? Place a towel between your skin and the heat source. ? Leave the heat on for 20-30 minutes. ? Remove the heat if your skin turns bright red. This is especially important if you are unable to feel pain, heat, or cold. You may have a greater risk of getting burned.  If directed, put ice on the affected area. ? If you have a removable sleeve or brace, remove it as told by your health care provider. ? Put ice in a plastic bag. ? Place a towel between your skin and the bag. ? Leave the ice on for 20 minutes, 2-3 times a day.  Move your toes often to reduce stiffness and swelling.  Raise (elevate) the injured area above the level of your heart while you are sitting or lying down. Activity  Avoid  activities where both feet leave the ground at the same time (high-impact activities). Examples are running, jumping rope, and doing jumping jacks.  Return to your normal activities as told by your health care provider. Ask your health care provider what activities are safe for you.  Follow the exercise plan that your health care provider designed for you. Your health care provider may suggest that you: ? Avoid activities that make knee pain worse. This may require you to change your exercise routines, sport participation, or job duties. ? Wear shoes with  cushioned soles. ? Avoid high-impact activities or sports that require running and sudden changes in direction. ? Do physical therapy as told by your health care provider. Physical therapy is planned to match your needs and abilities. It may include exercises for strength, flexibility, stability, and endurance. ? Do exercises that increase balance and strength, such as tai chi and yoga.  Do not use the injured limb to support your body weight until your health care provider says that you can. Use crutches, a cane, or a walker, as told by your health care provider. General instructions  Take over-the-counter and prescription medicines only as told by your health care provider.  Lose weight if you are overweight. Losing even a little weight can reduce knee pain. Ask your health care provider what your ideal weight is, and how to safely lose extra weight. A food expert (dietitian) may be able to help you plan your meals.  Do not use any products that contain nicotine or tobacco, such as cigarettes, e-cigarettes, and chewing tobacco. These can delay healing. If you need help quitting, ask your health care provider.  Keep all follow-up visits as told by your health care provider. This is important. Contact a health care provider if:  You have knee pain that is not getting better or gets worse.  You are unable to do your physical therapy exercises due to knee pain. Get help right away if:  Your knee swells and the swelling becomes worse.  You cannot move your knee.  You have severe knee pain. Summary  Knee pain that lasts more than 3 months is considered chronic knee pain.  The main treatments for chronic knee pain are physical therapy and weight loss. You may also need to take medicines, wear a knee sleeve or brace, use crutches, and apply ice or heat.  Losing even a little weight can reduce knee pain. Ask your health care provider what your ideal weight is, and how to safely lose extra  weight. A food expert (dietitian) may be able to help you plan your meals.  Work with a physical therapist to make a safe exercise program, as told by your health care provider. This information is not intended to replace advice given to you by your health care provider. Make sure you discuss any questions you have with your health care provider. Document Revised: 07/14/2018 Document Reviewed: 07/14/2018 Elsevier Patient Education  [place].

## 2019-10-04 ENCOUNTER — Telehealth: Payer: Self-pay | Admitting: Family Medicine

## 2019-10-04 NOTE — Telephone Encounter (Signed)
Will forward to MD to update patient on form status.  Otilia Kareem,CMA

## 2019-10-04 NOTE — Telephone Encounter (Signed)
Tanya Owens is calling to check on the status her paper work being completed for her home health order. She said Dr. Atha Starks informed her he had the paper work.   Please call Tanya Owens when it is completed.

## 2019-10-05 NOTE — Telephone Encounter (Signed)
Called patient and discussed her request for assistance with cooking and cleaning at home. Explained to patient that at this time I do not feel it would be good medical care to provide this. Requested patient schedule appointment to discuss ways we can improve her mobility and functionality and explained that we can re-evaluate her request in the future.

## 2019-10-17 ENCOUNTER — Other Ambulatory Visit: Payer: Self-pay | Admitting: *Deleted

## 2019-10-17 NOTE — Telephone Encounter (Signed)
Patient is requesting a refill of cream.  Rhythm Wigfall,CMA

## 2019-10-18 MED ORDER — CLOTRIMAZOLE-BETAMETHASONE 1-0.05 % EX CREA: 1.0000 "application " | TOPICAL_CREAM | Freq: Two times a day (BID) | CUTANEOUS | 0 refills | Status: DC

## 2019-10-18 NOTE — Telephone Encounter (Signed)
Patient calling for this refill. Please advise.

## 2019-10-25 ENCOUNTER — Ambulatory Visit
Admission: RE | Admit: 2019-10-25 | Discharge: 2019-10-25 | Disposition: A | Discharge status: Home / Self Care | Payer: Medicaid Other | Source: Ambulatory Visit | Attending: Family Medicine | Admitting: Family Medicine

## 2019-10-25 ENCOUNTER — Other Ambulatory Visit: Payer: Self-pay

## 2019-10-25 DIAGNOSIS — Z1231 Encounter for screening mammogram for malignant neoplasm of breast: Secondary | ICD-10-CM | POA: Diagnosis not present

## 2019-10-28 IMAGING — DX DG HAND COMPLETE 3+V*L*
3 series · 3 of 3 positions shown · non-contrast
Comparison: None.

CLINICAL DATA: Left hand swelling for the past week. No known
injury.

EXAM:
LEFT HAND - COMPLETE 3+ VIEW

[dg hand complete left (1 of 3)]
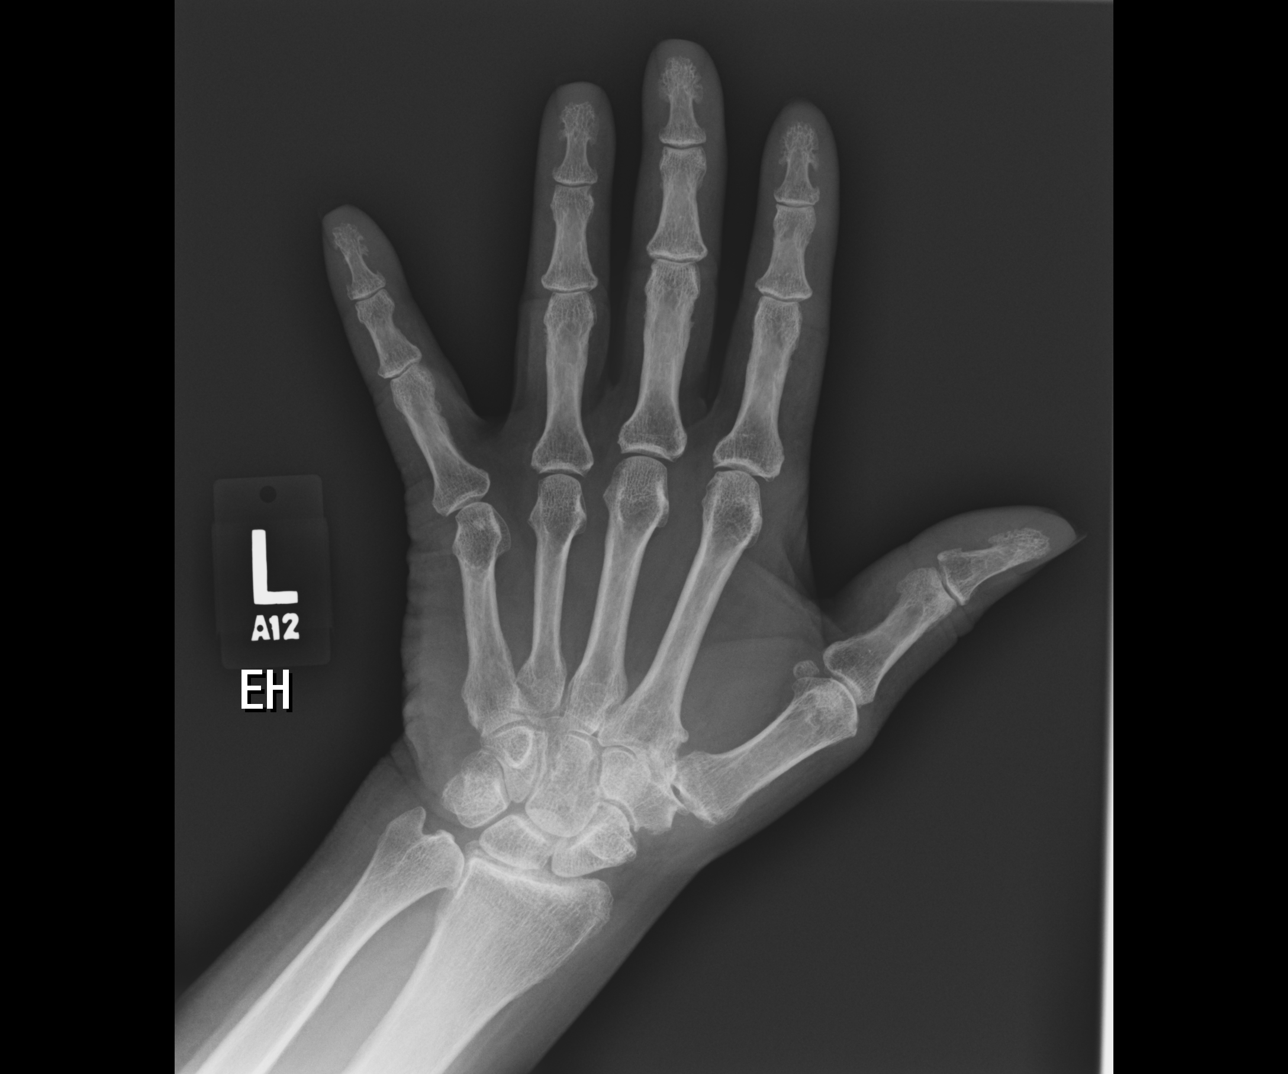

[dg hand complete left (2 of 3)]
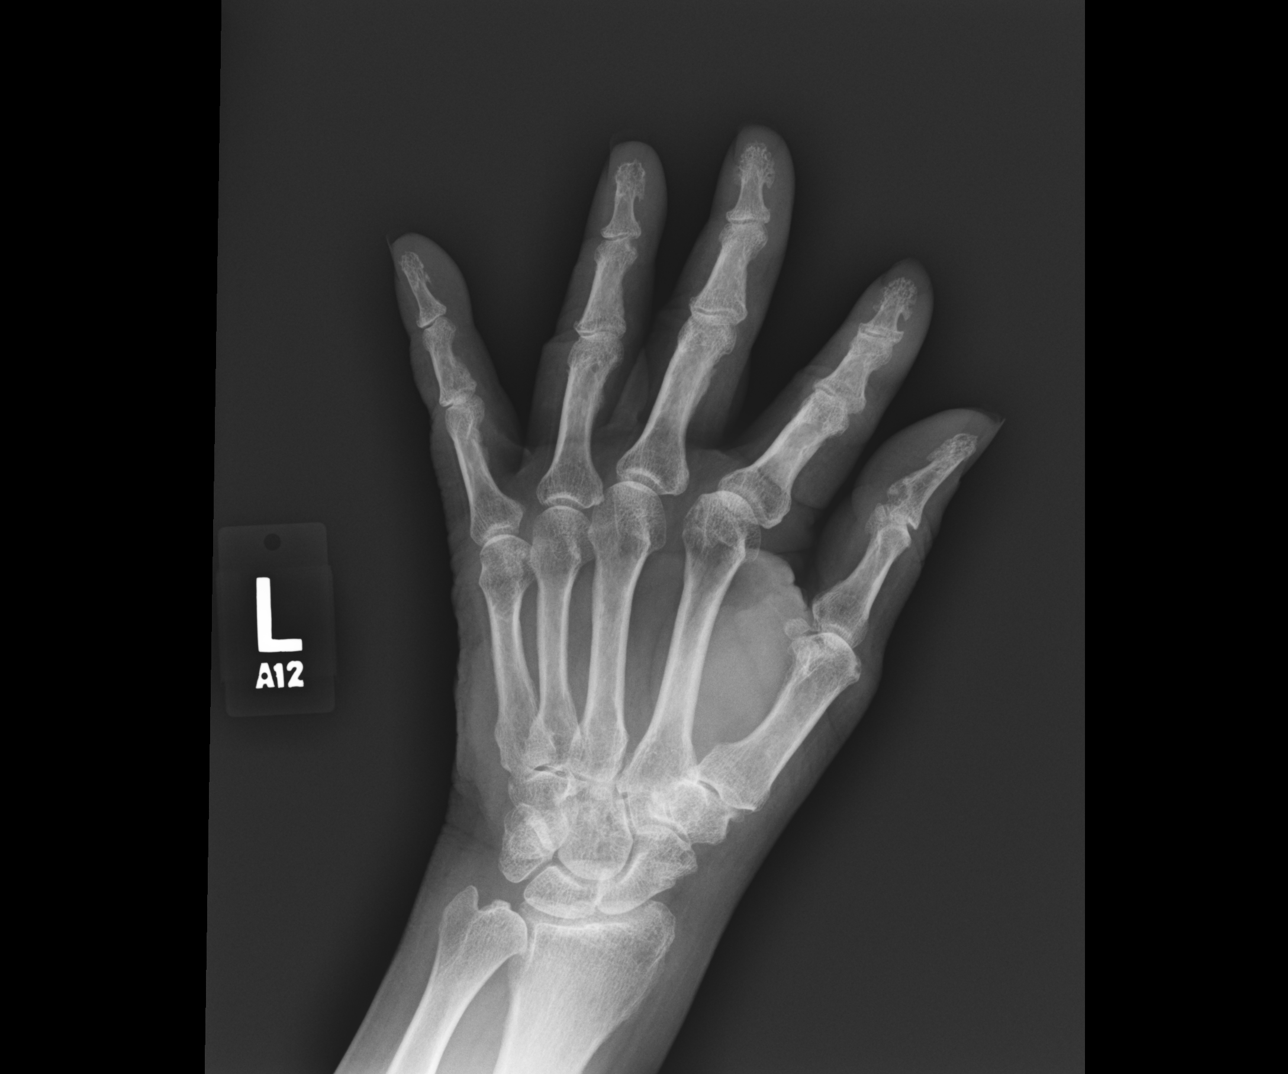

[dg hand complete left (3 of 3)]
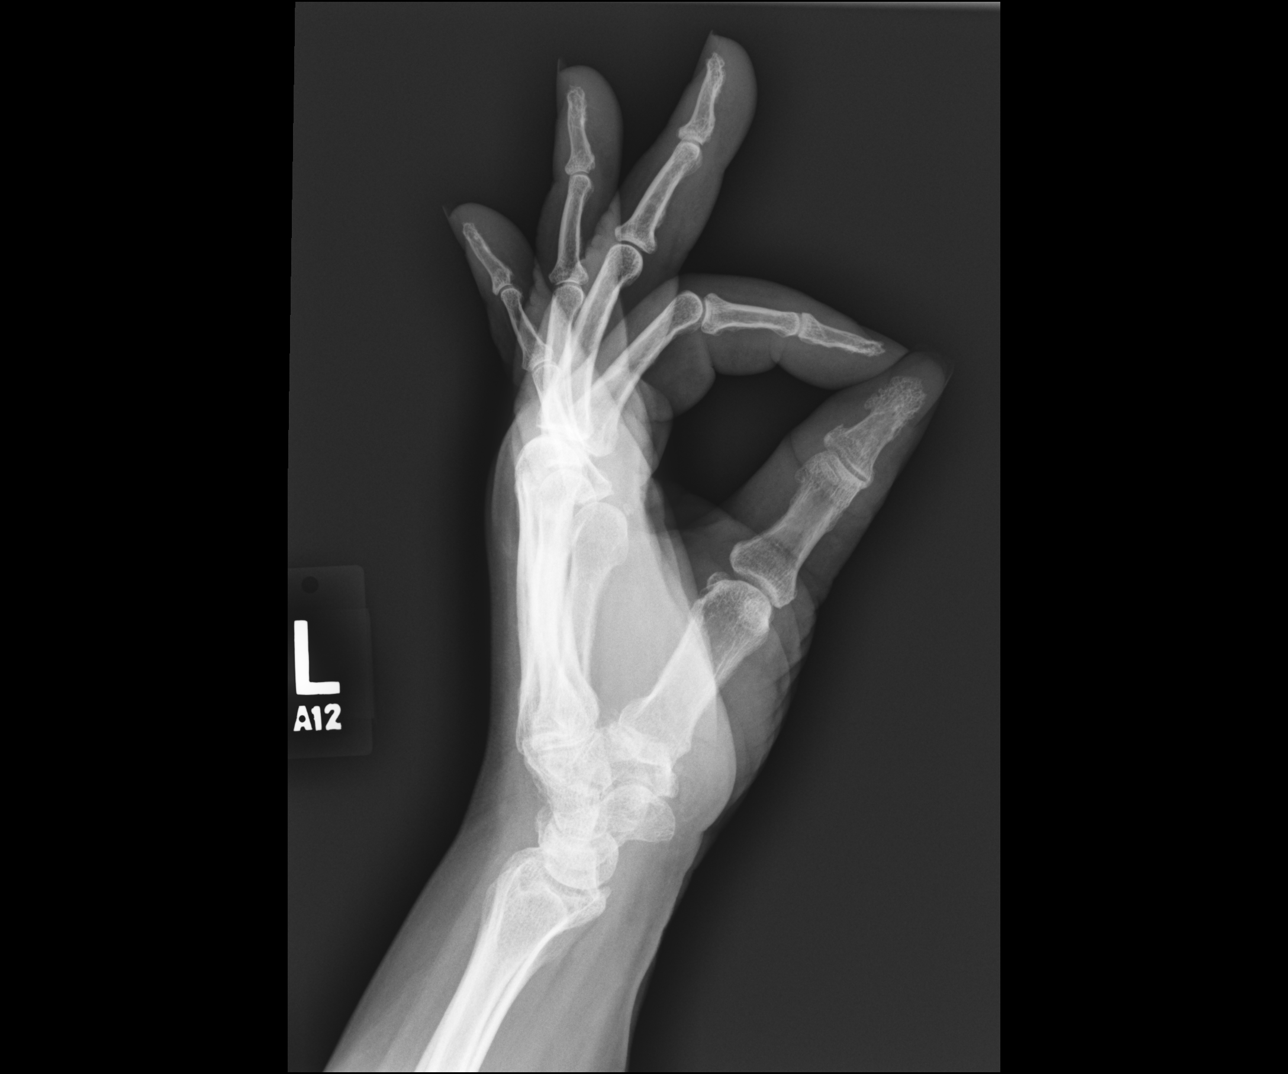

[3 of 3 positions shown; findings below may reference images not displayed]

FINDINGS: No fracture or dislocation. Mild degenerative change involving the
STT joint at the base of the thumb. Remaining joint spaces appear
preserved. No definite erosions. No evidence of chondrocalcinosis.
Regional soft tissues appear normal. No radiopaque foreign body.
IMPRESSION: 1. No acute findings.
2. Mild degenerative change involving the base of the thumb.

## 2019-10-28 IMAGING — DX DG ANKLE COMPLETE 3+V*R*
3 series · 3 of 3 positions shown · non-contrast
Comparison: No recent.

CLINICAL DATA: Ankle pain swelling.

EXAM:
RIGHT ANKLE - COMPLETE 3+ VIEW

[dg ankle complete right (1 of 3)]
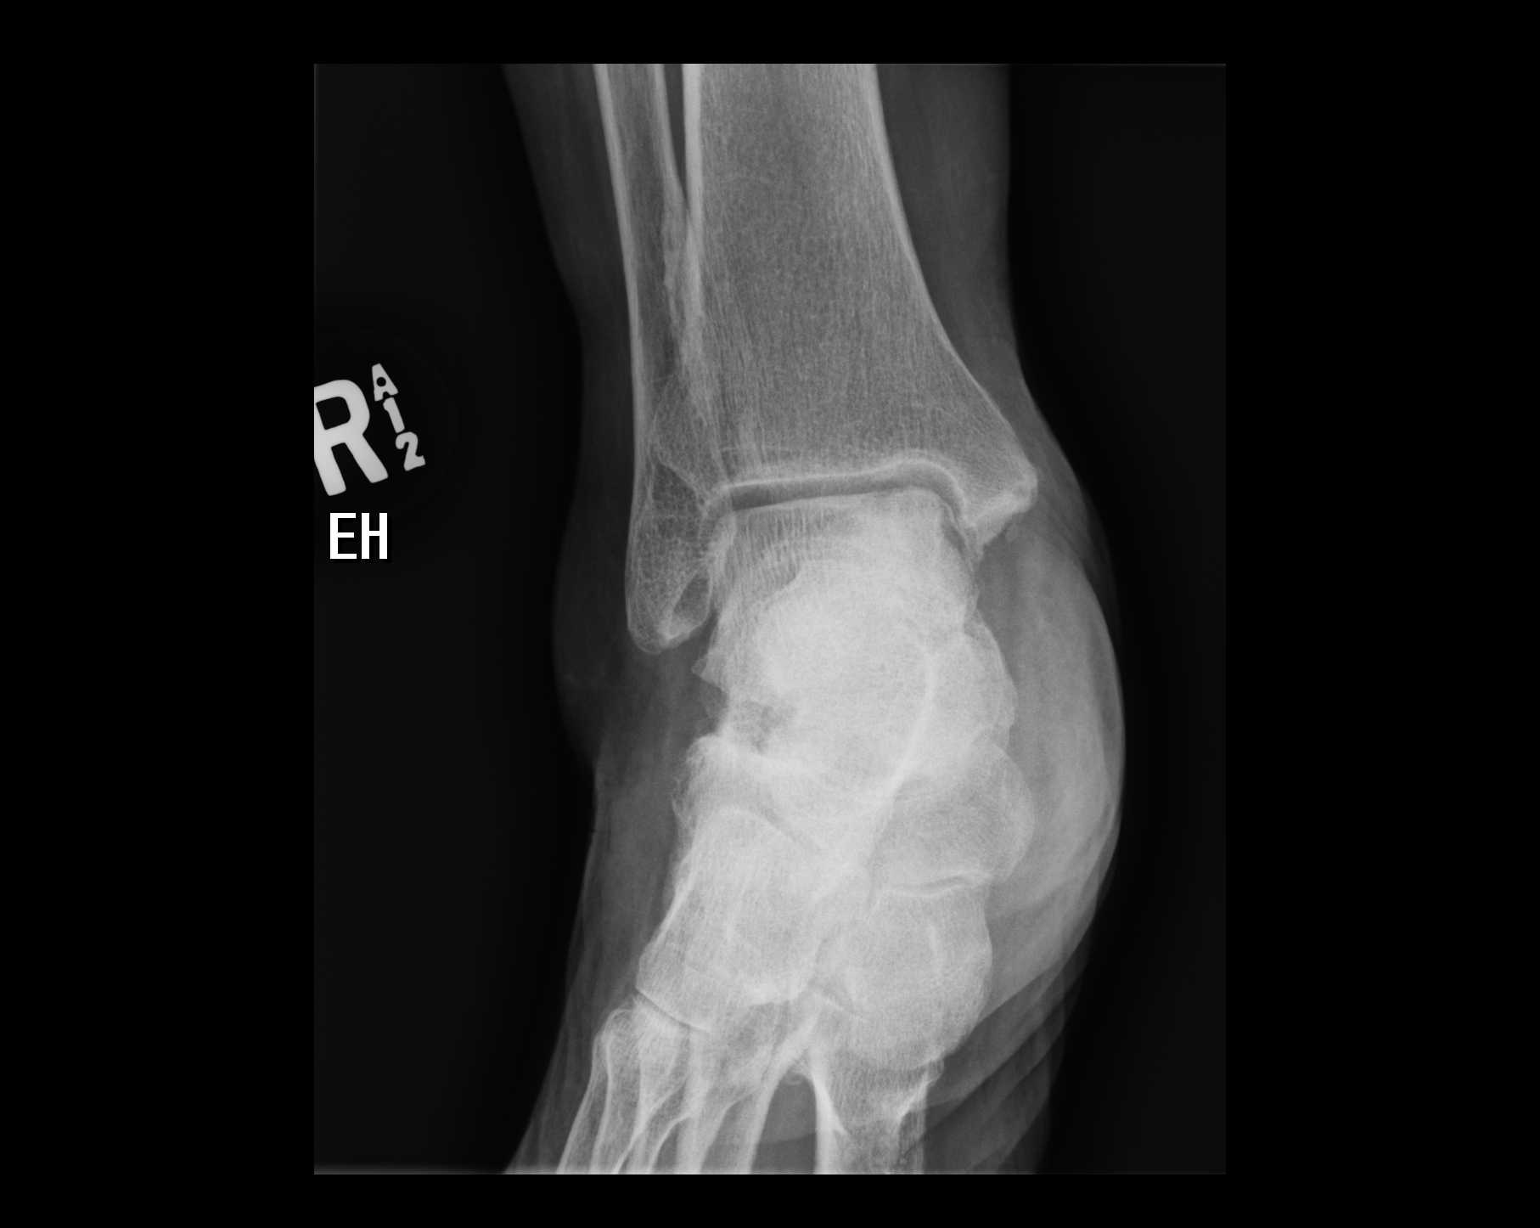

[dg ankle complete right (2 of 3)]
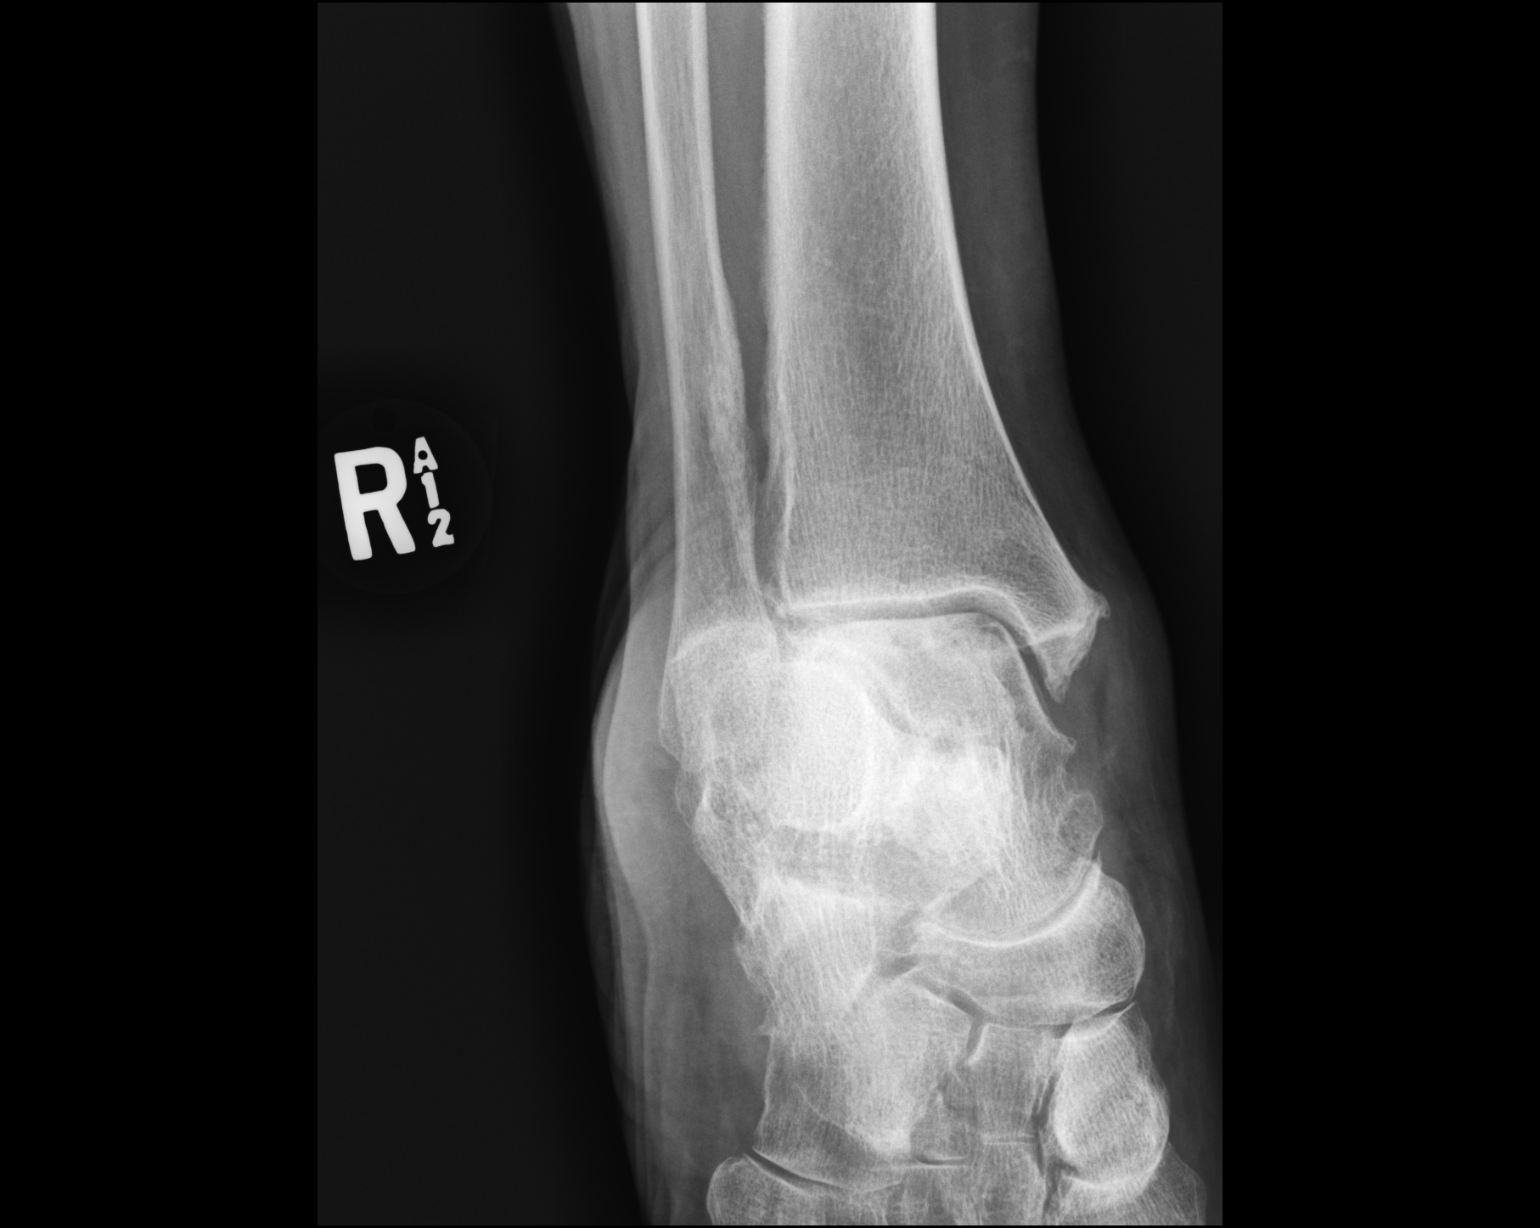

[dg ankle complete right (3 of 3)]
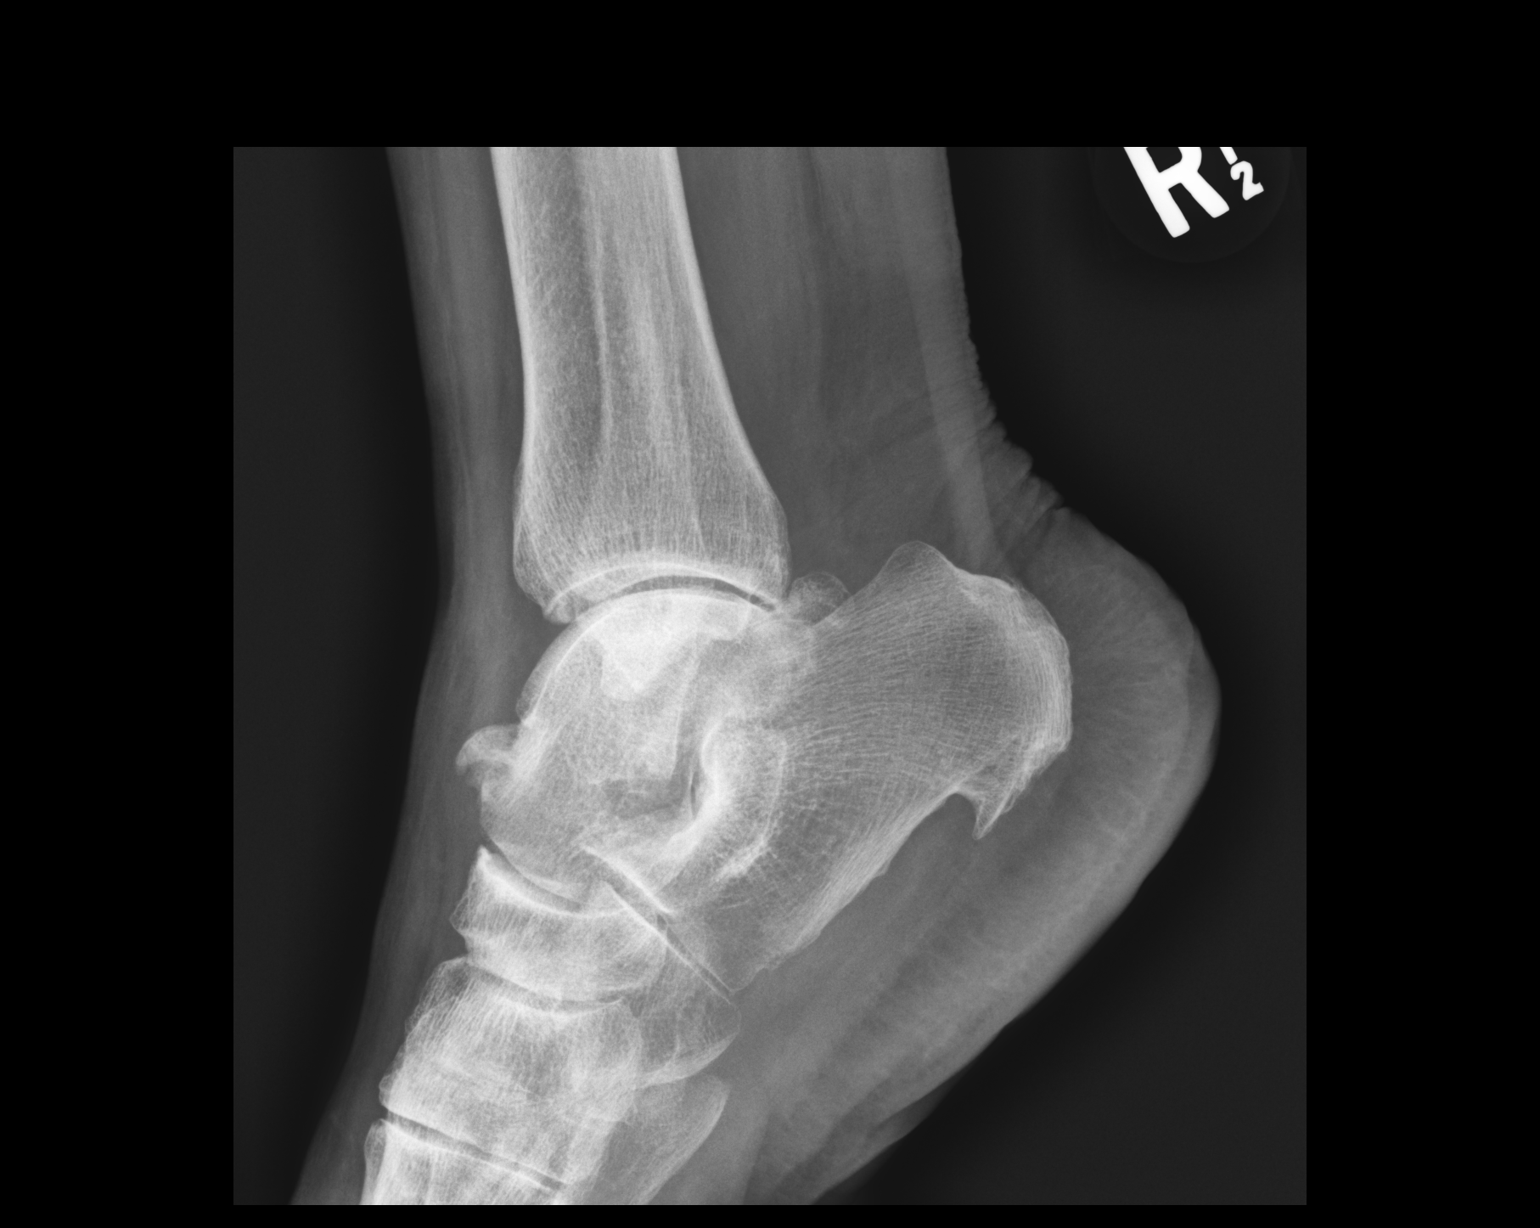

[3 of 3 positions shown; findings below may reference images not displayed]

FINDINGS: Diffuse soft tissue swelling. Diffuse degenerative change. Small
fracture fragment noted adjacent to the medial malleolus. Age is
undetermined. This may be old. No other focal bony abnormalities
identified.
IMPRESSION: 1. Tiny bony density noted adjacent to the medial malleolus. This is
consistent small fracture fragment. Age is undetermined. This may be
old.

2.  Diffuse soft tissue swelling.  Diffuse degenerative change.

## 2019-10-28 IMAGING — DX DG ANKLE COMPLETE 3+V*L*
3 series · 3 of 3 positions shown · non-contrast
Comparison: None.

CLINICAL DATA: Bilateral ankle pain and swelling for the past 2
weeks. No known injury.

EXAM:
LEFT ANKLE COMPLETE - 3+ VIEW

[dg ankle complete left (1 of 3)]
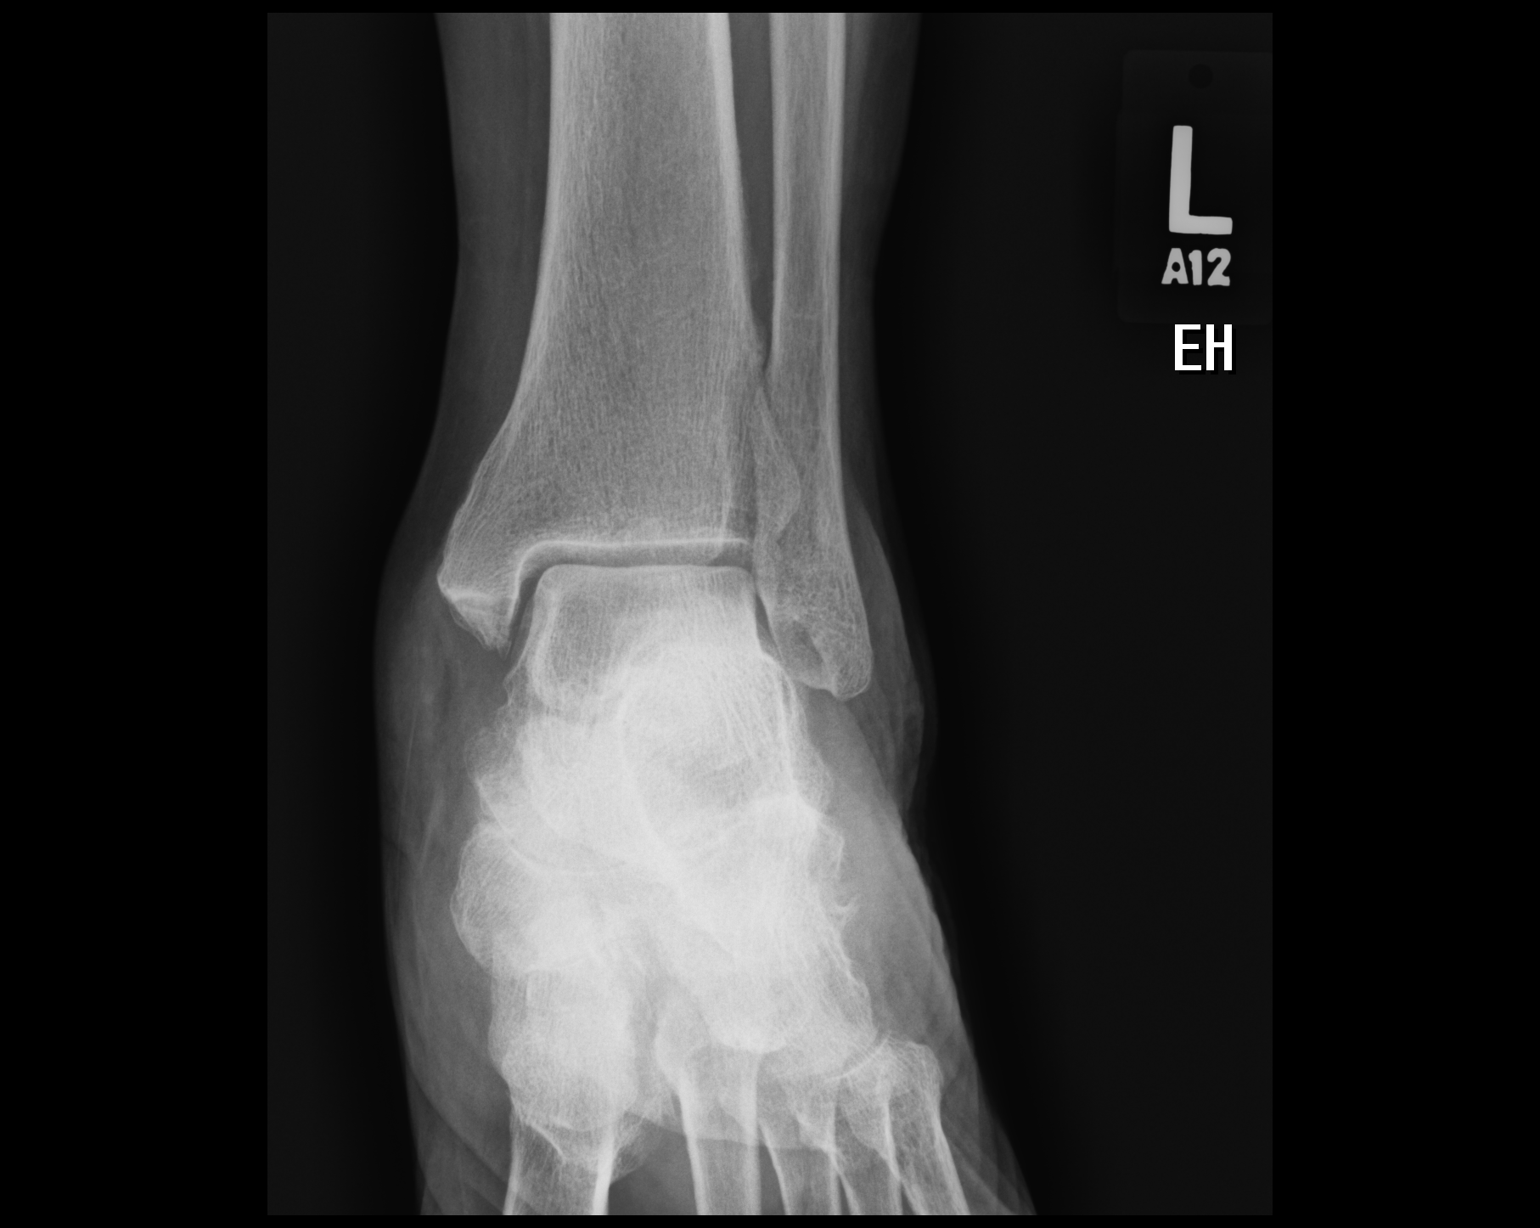

[dg ankle complete left (2 of 3)]
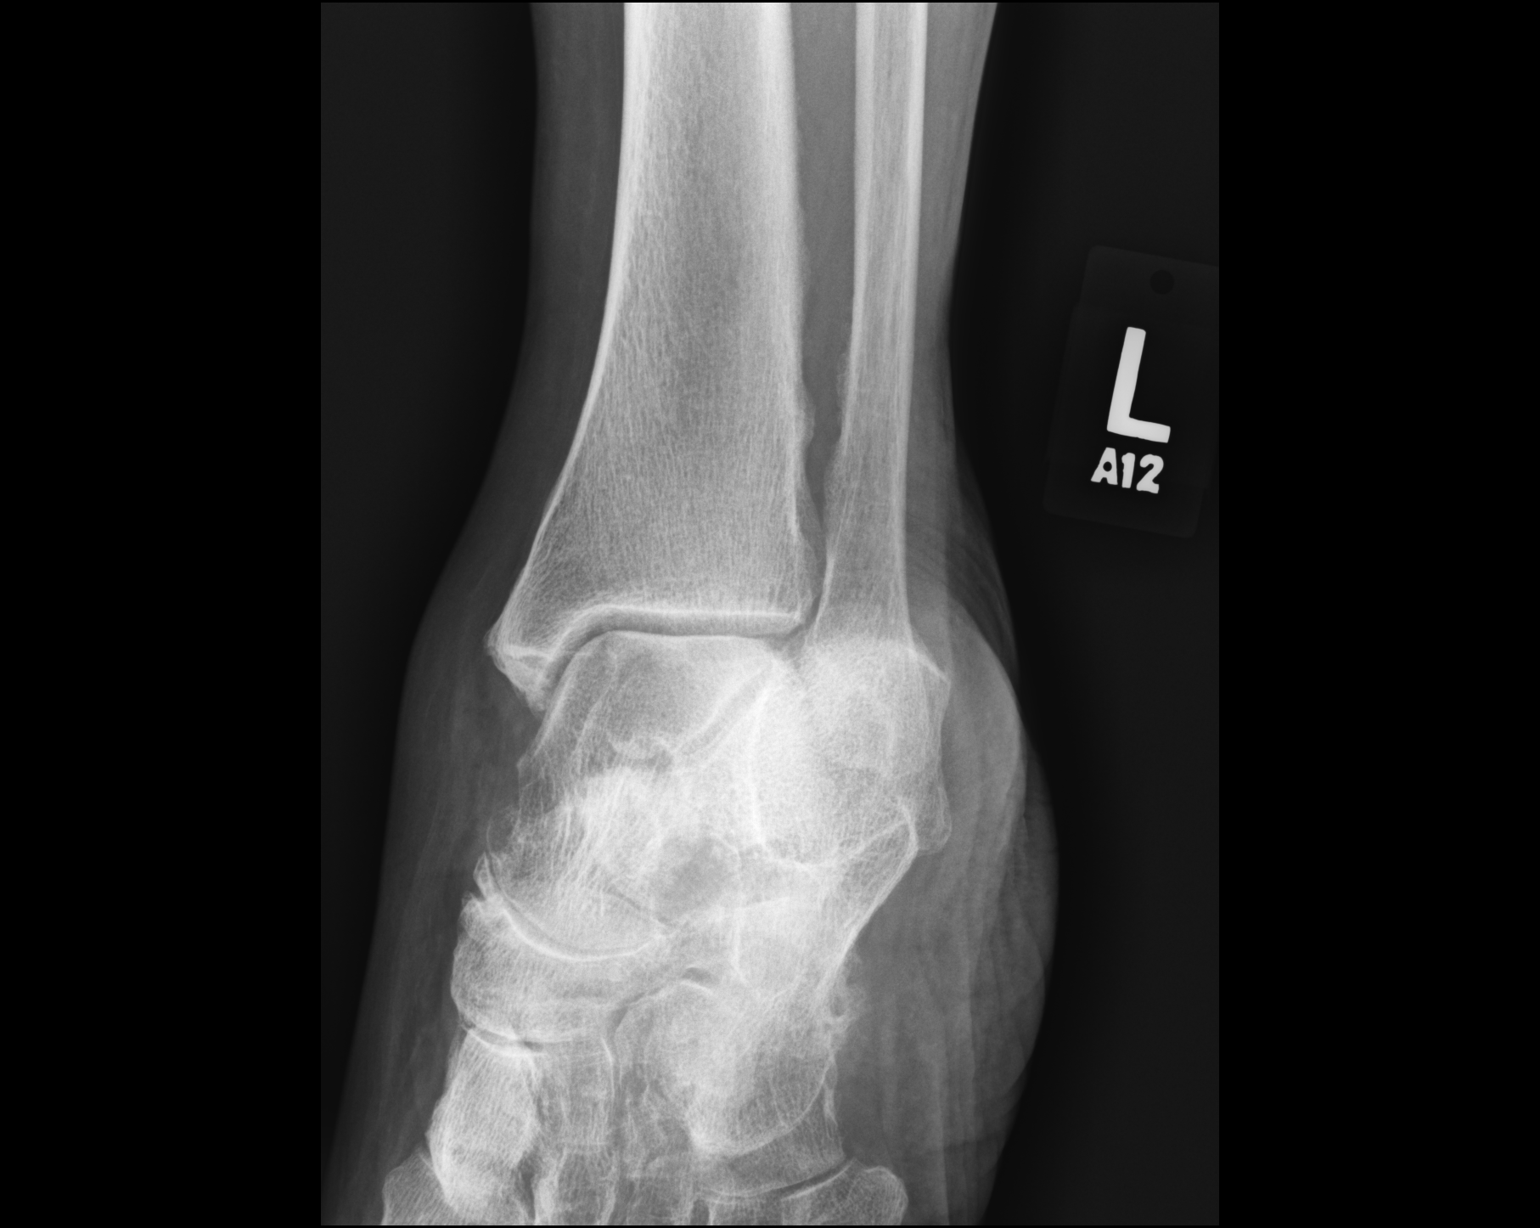

[dg ankle complete left (3 of 3)]
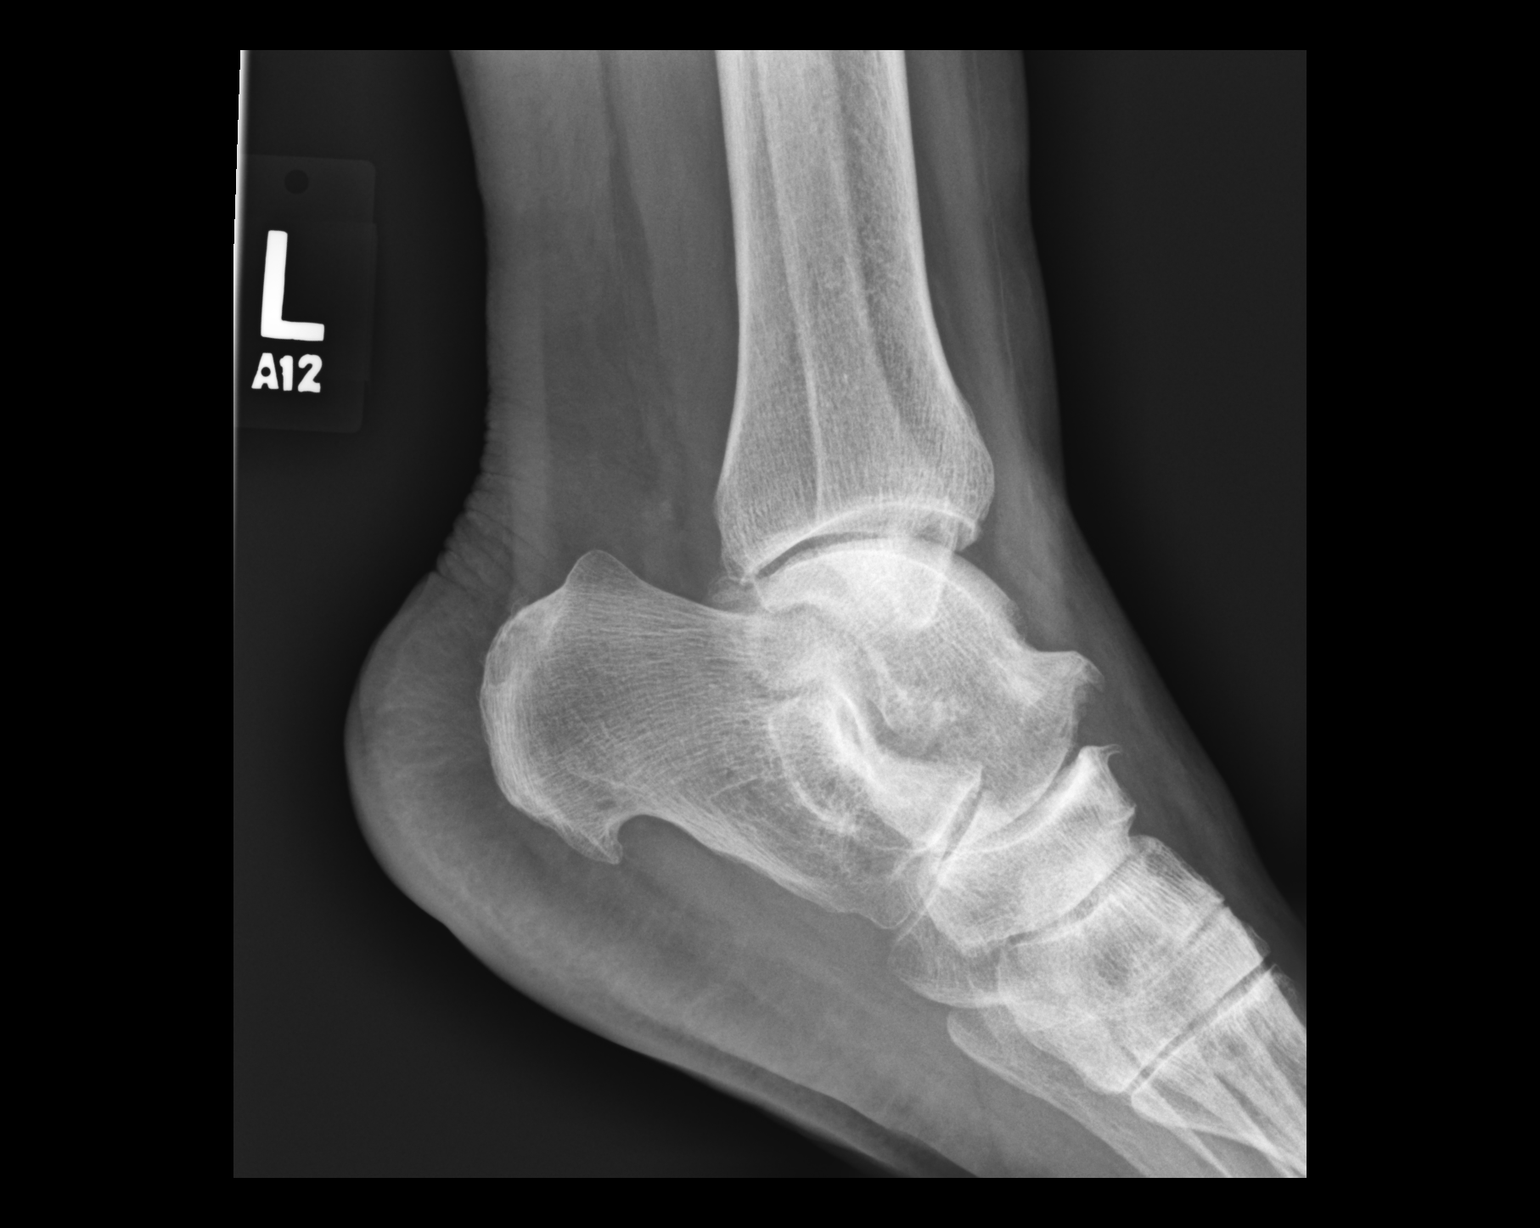

[3 of 3 positions shown; findings below may reference images not displayed]

FINDINGS: Mild diffuse soft tissue swelling about the ankle. No associated
fracture or dislocation. Mild degenerative change of several midfoot
articulations, most conspicuously involving the talonavicular joint.
The ankle mortise is preserved given obliquity. No definite ankle
joint effusion. Small plantar calcaneal spur.
IMPRESSION: 1. Nonspecific mild diffuse soft tissue swelling about the ankle
without associated fracture or dislocation.
2. Small plantar calcaneal spur.

## 2019-11-16 DIAGNOSIS — Z419 Encounter for procedure for purposes other than remedying health state, unspecified: Secondary | ICD-10-CM | POA: Diagnosis not present

## 2019-11-29 ENCOUNTER — Other Ambulatory Visit: Payer: Self-pay | Admitting: Family Medicine

## 2019-11-29 DIAGNOSIS — E119 Type 2 diabetes mellitus without complications: Secondary | ICD-10-CM

## 2019-12-17 DIAGNOSIS — Z419 Encounter for procedure for purposes other than remedying health state, unspecified: Secondary | ICD-10-CM | POA: Diagnosis not present

## 2019-12-18 NOTE — Patient Instructions (Signed)
It was great to see you!  Our plans for today:  -Today we checked your A1c, previously it was 6.7.  Today it is 6.3.  We do not need to make any changes to Metformin at this time. -We are checking some labs including a CBC and a CMP today. -I am providing a refill for your cream.  Use this on your rash for the next 2 weeks and make a follow-up appointment if this does not improve.  I recommend that you change your detergents to unscented detergents and the same with your soaps as this may be the cause of your new rash. -I would like for you to check your folic acid bottle when you get home and let me know the dose of this so we can make sure we get you refills.  If this is being prescribed by your rheumatologist you can simply call your pharmacy and ask them to request a refill for it.  We are checking some labs today, I will call you if they are abnormal will send you a MyChart message or a letter if they are normal.  If you do not hear about your labs in the next 2 weeks please let us know.  Take care and seek immediate care sooner if you develop any concerns.   Dr. [facility]

## 2019-12-18 NOTE — Progress Notes (Signed)
    SUBJECTIVE:   CHIEF COMPLAINT / HPI:   Type 2 diabetes Patient's previous A1c completed in March of this year had improved to 6.0. Medications include Metformin 1000 mg twice per day.  Patient denies complaints or issues at this time.  Seronegative arthropathy: Patient with a history of seronegative arthropathy, followed by rheumatology.  Presenting today for lab work including CBC and CMP prior to her appointment with her rheumatologist.  Patient continues on Enbrel and methotrexate.  Patient also on folate but states she does not know the dose of this.  She thinks she gets this from her rheumatologist but is not sure.  She states that she does have some this medication remaining.   PERTINENT  PMH / PSH: Type 2 diabetes and seronegative arthropathy as well as hypertension, hyperlipidemia.  OBJECTIVE:   BP 132/76   Pulse 84   Ht 5\' 9"  (1.753 m)   Wt 243 lb (110.2 kg)   SpO2 99%   BMI 35.88 kg/m     General: NAD, pleasant, able to participate in exam Cardiac: RRR, no murmurs. Respiratory: CTAB Skin: Isolated patch of small vesicles located in anterior shin of the right leg with some excoriations, no erythema.  Patient with similar patch on right lower abdomen of vesicles which are surrounded by excoriations from scratching and no signs of erythema.  Consistent with contact dermatitis. Neuro: alert, no obvious focal deficits Psych: Normal affect and mood  ASSESSMENT/PLAN:   Seronegative arthropathy of multiple sites Assessment: Patient with a history of seronegative arthropathy but follows with rheumatology.  Currently on Enbrel and methotrexate as well as folic acid. Plan: -We will check CBC with differential as well as CMP prior to patient's appointment with rheumatology -Patient plans to follow-up after determining the current dose of her folic acid so we can ensure that she has refills of these.  If patient is receiving this from her rheumatologist she plans to follow-up  with them for more refills as she currently does have enough for the next few days.  Type 2 diabetes mellitus without complication Assessment: Well-controlled diabetes with A1c currently of 6.3.  Currently on Metformin 1000 mg twice per day. Plan: -Continue current regimen of Metformin 1000 mg twice daily -Follow-up in 3 months for recheck of A1c.  Contact dermatitis Assessment: Patient with pruritic rash with some vesicles surrounded by excoriations without erythema located on the anterior shin as well as the right lower quadrant of the abdomen.  Patient also endorses some itching of the lower back without vesicles present.  Most likely etiology is contact dermatitis.  Patient also endorses changing detergents about a month ago, just prior to this initiating. Plan: -Provide patient refill of her clotrimazole-betamethasone with recommendation to follow-up in 14 days if this does not improve -Patient plans to change her detergent and soaps back to unscented as this started soon after changing her detergent.    , DO [facility]

## 2019-12-19 ENCOUNTER — Other Ambulatory Visit: Payer: Self-pay

## 2019-12-19 ENCOUNTER — Ambulatory Visit (INDEPENDENT_AMBULATORY_CARE_PROVIDER_SITE_OTHER): Payer: Medicaid Other | Admitting: Family Medicine

## 2019-12-19 ENCOUNTER — Encounter: Payer: Self-pay | Admitting: Family Medicine

## 2019-12-19 VITALS — BP 132/76 | HR 84 | Ht 69.0 in | Wt 243.0 lb

## 2019-12-19 DIAGNOSIS — E119 Type 2 diabetes mellitus without complications: Secondary | ICD-10-CM

## 2019-12-19 DIAGNOSIS — M1389 Other specified arthritis, multiple sites: Secondary | ICD-10-CM | POA: Diagnosis not present

## 2019-12-19 DIAGNOSIS — L249 Irritant contact dermatitis, unspecified cause: Secondary | ICD-10-CM

## 2019-12-19 DIAGNOSIS — M0609 Rheumatoid arthritis without rheumatoid factor, multiple sites: Secondary | ICD-10-CM

## 2019-12-19 DIAGNOSIS — L259 Unspecified contact dermatitis, unspecified cause: Secondary | ICD-10-CM | POA: Insufficient documentation

## 2019-12-19 LAB — POCT GLYCOSYLATED HEMOGLOBIN (HGB A1C): HbA1c, POC (prediabetic range): 6.3 % (ref 5.7–6.4)

## 2019-12-19 MED ORDER — CLOTRIMAZOLE-BETAMETHASONE 1-0.05 % EX CREA: 1.0000 "application " | TOPICAL_CREAM | Freq: Two times a day (BID) | CUTANEOUS | 0 refills | Status: DC

## 2019-12-19 NOTE — Assessment & Plan Note (Signed)
Assessment: Patient with a history of seronegative arthropathy but follows with rheumatology.  Currently on Enbrel and methotrexate as well as folic acid. Plan: -We will check CBC with differential as well as CMP prior to patient's appointment with rheumatology -Patient plans to follow-up after determining the current dose of her folic acid so we can ensure that she has refills of these.  If patient is receiving this from her rheumatologist she plans to follow-up with them for more refills as she currently does have enough for the next few days.

## 2019-12-19 NOTE — Assessment & Plan Note (Signed)
Assessment: Patient with pruritic rash with some vesicles surrounded by excoriations without erythema located on the anterior shin as well as the right lower quadrant of the abdomen.  Patient also endorses some itching of the lower back without vesicles present.  Most likely etiology is contact dermatitis.  Patient also endorses changing detergents about a month ago, just prior to this initiating. Plan: -Provide patient refill of her clotrimazole-betamethasone with recommendation to follow-up in 14 days if this does not improve -Patient plans to change her detergent and soaps back to unscented as this started soon after changing her detergent.

## 2019-12-19 NOTE — Assessment & Plan Note (Signed)
Assessment: Well-controlled diabetes with A1c currently of 6.3.  Currently on Metformin 1000 mg twice per day. Plan: -Continue current regimen of Metformin 1000 mg twice daily -Follow-up in 3 months for recheck of A1c.

## 2019-12-20 ENCOUNTER — Encounter: Payer: Self-pay | Admitting: Family Medicine

## 2019-12-20 LAB — CBC WITH DIFFERENTIAL/PLATELET
Basophils Absolute: 0.1 10*3/uL (ref 0.0–0.2)
Basos: 1 %
EOS (ABSOLUTE): 0.4 10*3/uL (ref 0.0–0.4)
Eos: 5 %
Hematocrit: 36.6 % (ref 34.0–46.6)
Hemoglobin: 11.8 g/dL (ref 11.1–15.9)
Immature Grans (Abs): 0 10*3/uL (ref 0.0–0.1)
Immature Granulocytes: 0 %
Lymphocytes Absolute: 3 10*3/uL (ref 0.7–3.1)
Lymphs: 41 %
MCH: 27.4 pg (ref 26.6–33.0)
MCHC: 32.2 g/dL (ref 31.5–35.7)
MCV: 85 fL (ref 79–97)
Monocytes Absolute: 0.5 10*3/uL (ref 0.1–0.9)
Monocytes: 7 %
Neutrophils Absolute: 3.4 10*3/uL (ref 1.4–7.0)
Neutrophils: 46 %
Platelets: 277 10*3/uL (ref 150–450)
RBC: 4.3 x10E6/uL (ref 3.77–5.28)
RDW: 13.9 % (ref 11.7–15.4)
WBC: 7.4 10*3/uL (ref 3.4–10.8)

## 2019-12-20 LAB — COMPREHENSIVE METABOLIC PANEL
ALT: 21 IU/L (ref 0–32)
AST: 23 IU/L (ref 0–40)
Albumin/Globulin Ratio: 1.4 (ref 1.2–2.2)
Albumin: 4.3 g/dL (ref 3.8–4.8)
Alkaline Phosphatase: 76 IU/L (ref 48–121)
BUN/Creatinine Ratio: 14 (ref 12–28)
BUN: 10 mg/dL (ref 8–27)
Bilirubin Total: 0.4 mg/dL (ref 0.0–1.2)
CO2: 23 mmol/L (ref 20–29)
Calcium: 9.2 mg/dL (ref 8.7–10.3)
Chloride: 102 mmol/L (ref 96–106)
Creatinine, Ser: 0.7 mg/dL (ref 0.57–1.00)
GFR calc Af Amer: 108 mL/min/{1.73_m2} (ref >59)
GFR calc non Af Amer: 94 mL/min/{1.73_m2} (ref >59)
Globulin, Total: 3 g/dL (ref 1.5–4.5)
Glucose: 112 mg/dL — ABNORMAL HIGH (ref 65–99)
Potassium: 3.9 mmol/L (ref 3.5–5.2)
Sodium: 139 mmol/L (ref 134–144)
Total Protein: 7.3 g/dL (ref 6.0–8.5)

## 2020-01-09 ENCOUNTER — Telehealth: Payer: Self-pay

## 2020-01-09 ENCOUNTER — Other Ambulatory Visit: Payer: Self-pay

## 2020-01-09 ENCOUNTER — Ambulatory Visit (INDEPENDENT_AMBULATORY_CARE_PROVIDER_SITE_OTHER): Payer: Medicaid Other | Admitting: Family Medicine

## 2020-01-09 VITALS — BP 126/78 | HR 77 | Wt 241.0 lb

## 2020-01-09 DIAGNOSIS — L299 Pruritus, unspecified: Secondary | ICD-10-CM | POA: Diagnosis not present

## 2020-01-09 DIAGNOSIS — B86 Scabies: Secondary | ICD-10-CM

## 2020-01-09 MED ORDER — IVERMECTIN 3 MG PO TABS: 21.0000 mg | ORAL_TABLET | Freq: Every day | ORAL | 0 refills | Status: DC

## 2020-01-09 MED ORDER — HYDROXYZINE HCL 10 MG PO TABS: 10.0000 mg | ORAL_TABLET | Freq: Three times a day (TID) | ORAL | 0 refills | Status: DC | PRN

## 2020-01-09 MED ORDER — IVERMECTIN 3 MG PO TABS: 21.0000 mg | ORAL_TABLET | ORAL | 0 refills | Status: AC

## 2020-01-09 NOTE — Telephone Encounter (Signed)
Opened in error

## 2020-01-09 NOTE — Telephone Encounter (Signed)
Tanya Owens from [employer] calls nurse line asking to speak with prescriber in regards to Ivermectin. Tanya Owens states the dose is "abnormally" high dose, and would like to speak with prescriber directly before filling. Please advise.    [phone]

## 2020-01-09 NOTE — Patient Instructions (Signed)
It was great seeing you today!  Please check-out at the front desk before leaving the clinic.   Visit Remembers: - Stop by the pharmacy to pick up your prescriptions  - Continue to work on your healthy eating habits and incorporating exercise into your daily life.  - wash the linen in your home and be sure to dry them in the dryer  - avoid sharing bed lining with others  - Medicine Changes: Take Atarax as needed for itching.  Take Ivermectin for the next 14 days.    Feel free to call with any questions or concerns at any time, at [phone].   Take care,  Dr. [facility]  Scabies, Adult  Scabies is a skin condition that happens when very small insects get under the skin (infestation). This causes a rash and severe itchiness. Scabies can spread from person to person (is contagious). If you get scabies, it is common for others in your household to get scabies too. With proper treatment, symptoms usually go away in 2-4 weeks. Scabies usually does not cause lasting problems. What are the causes? This condition is caused by tiny mites (Sarcoptes scabiei, or human itch mites) that can only be seen with a microscope. The mites get into the top layer of skin and lay eggs. Scabies can spread from person to person through:  Close contact with a person who has scabies.  Sharing or having contact with infested items, such as towels, bedding, or clothing. What increases the risk? The following factors may make you more likely to develop this condition:  Living in a nursing home or other extended care facility.  Having sexual contact with a partner who has scabies.  Caring for others who are at increased risk for scabies. What are the signs or symptoms? Symptoms of this condition include:  Severe itchiness. This is often worse at night.  A rash that includes tiny red bumps or blisters. The rash commonly occurs on the hands, wrists, elbows, armpits, chest,  waist, groin, or buttocks. The bumps may form a line (burrow) in some areas.  Skin irritation. This can include scaly patches or sores. How is this diagnosed? This condition may be diagnosed based on:  A physical exam of the skin.  A skin test. Your health care provider may take a sample of your affected skin (skin scraping) and have it examined under a microscope for signs of mites. How is this treated? This condition may be treated with:  Medicated cream or lotion that kills the mites. This is spread on the entire body and left on for several hours. Usually, one treatment with medicated cream or lotion is enough to kill all the mites. In severe cases, the treatment may need to be repeated.  Medicated cream that relieves itching.  Medicines taken by mouth (orally) that: ? Relieve itching. ? Reduce the swelling and redness. ? Kill the mites. This treatment may be done in severe cases. Follow these instructions at home: Medicines   Take or apply over-the-counter and prescription medicines as told by your health care provider.  Apply medicated cream or lotion as told by your health care provider.  Do not wash off the medicated cream or lotion until the necessary amount of time has passed. Skin care   Avoid scratching the affected areas of your skin.  Keep your fingernails closely trimmed to reduce injury from scratching.  Take cool baths or apply cool washcloths to your skin to help reduce itching.  General instructions  Clean all items that you recently had contact with, including bedding, clothing, and furniture. Do this on the same day that you start treatment. ? Dry clean items, or use hot water to wash items. Dry items on the hot dry cycle. ? Place items that cannot be washed into closed, airtight plastic bags for at least 3 days. The mites cannot live for more than 3 days away from human skin. ? Vacuum furniture and mattresses that you use.  Make sure that other people  who may have been infested are examined by a health care provider. These include members of your household and anyone who may have had contact with infested items.  Keep all follow-up visits as told by your health care provider. This is important. Contact a health care provider if:  You have itching that does not go away after 4 weeks of treatment.  You continue to develop new bumps or burrows.  You have redness, swelling, or pain in your rash area after treatment.  You have fluid, blood, or pus coming from your rash. Summary  Scabies is a skin condition that causes a rash and severe itchiness.  This condition is caused by tiny mites that get into the top layer of the skin and lay eggs.  Scabies can spread from person to person.  Follow treatments as recommended by your health care provider.  Clean all items that you recently had contact with. This information is not intended to replace advice given to you by your health care provider. Make sure you discuss any questions you have with your health care provider. Document Revised: 03/09/2018 Document Reviewed: 03/09/2018 Elsevier Patient Education  2020 [employer].

## 2020-01-09 NOTE — Progress Notes (Signed)
   SUBJECTIVE:   CHIEF COMPLAINT / HPI:   Chief Complaint  Patient presents with  . Rash     Tanya Owens is a 59 y.o. female here for rash follow-up  Rash Patient with persistent itchy rash for the past month.  States that she was given a cream that helped some but the rash persists.  She has changed her detergent and soap to sensitive/fragrance free formulas.  Intense itching occurs at night.  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  Denies recent bug bites.    PERTINENT  PMH / PSH: reviewed and updated as appropriate   OBJECTIVE:   BP 126/78   Pulse 77   Wt 241 lb (109.3 kg)   SpO2 99%   BMI 35.59 kg/m    GEN: pleasant nonill appearing female, in no acute distress  CV: regular rate and rhythm RESP: no increased work of breathing, clear to ascultation bilaterally  MSK: moves all extremities appropriately SKIN: warm, dry, diffuse clustered pimple-like erythematous rash prominent on the extremities and torso including the wrist, excoriations present    ASSESSMENT/PLAN:   Scabies Patient previously treated with clotrimazole -betamethasone for contact dermatitis until to follow-up in 14 days.  Patient returns to clinic with persistent pruritic rash with intense pruritus at night.  Exam concerning for scabies.  We will treat patient with ivermectin 0.2 mcg/kg for 2 doses (now and another in 14 days).  Follow-up if not improving -Advised patient to wash all linen and dry with heat  -Handout provided     Katha Cabal, DO PGY-2, [facility] 01/09/2020

## 2020-01-11 DIAGNOSIS — M0579 Rheumatoid arthritis with rheumatoid factor of multiple sites without organ or systems involvement: Secondary | ICD-10-CM | POA: Diagnosis not present

## 2020-01-11 DIAGNOSIS — Z79899 Other long term (current) drug therapy: Secondary | ICD-10-CM | POA: Diagnosis not present

## 2020-01-15 ENCOUNTER — Encounter: Payer: Self-pay | Admitting: Family Medicine

## 2020-01-15 DIAGNOSIS — L282 Other prurigo: Secondary | ICD-10-CM | POA: Insufficient documentation

## 2020-01-15 NOTE — Assessment & Plan Note (Signed)
Patient previously treated with clotrimazole -betamethasone for contact dermatitis until to follow-up in 14 days.  Patient returns to clinic with persistent pruritic rash with intense pruritus at night.  Exam concerning for scabies.  We will treat patient with ivermectin 0.2 mcg/kg for 2 doses (now and another in 14 days).  Follow-up if not improving -Advised patient to wash all linen and dry with heat  -Handout provided

## 2020-01-16 DIAGNOSIS — L299 Pruritus, unspecified: Secondary | ICD-10-CM | POA: Diagnosis not present

## 2020-01-16 DIAGNOSIS — L0390 Cellulitis, unspecified: Secondary | ICD-10-CM | POA: Diagnosis not present

## 2020-01-16 DIAGNOSIS — L259 Unspecified contact dermatitis, unspecified cause: Secondary | ICD-10-CM | POA: Diagnosis not present

## 2020-01-17 DIAGNOSIS — Z419 Encounter for procedure for purposes other than remedying health state, unspecified: Secondary | ICD-10-CM | POA: Diagnosis not present

## 2020-01-18 ENCOUNTER — Ambulatory Visit (INDEPENDENT_AMBULATORY_CARE_PROVIDER_SITE_OTHER): Payer: Medicaid Other | Admitting: Family Medicine

## 2020-01-18 DIAGNOSIS — Z5329 Procedure and treatment not carried out because of patient's decision for other reasons: Secondary | ICD-10-CM

## 2020-01-18 NOTE — Progress Notes (Signed)
Patient had a late cancellation for her appointment today 01/18/2020 for follow up for "itching all over body". She was rescheduled for 01/24/2020 at 8:30am.     Peggyann Shoals, DO [facility], PGY-3 01/18/2020 9:43 AM

## 2020-01-24 ENCOUNTER — Ambulatory Visit: Payer: Medicaid Other

## 2020-01-31 NOTE — Progress Notes (Signed)
    SUBJECTIVE:   CHIEF COMPLAINT / HPI:   Rash: pt has had rash/lesions on her right lower leg and right hip that was first documented at a visit in an august 3rd visit which was treated as contact dermatitis.  During a subsequent visit at the end of august it was treated as scabies with ivermectin. She states she went to an urgent care on 8/31 and was prescribed a steroid as well as some other medication she can't remember.  She feels like the lesions improved for a few days after taking medication from the urgent care but have since returned.  Patient also states she has itching in her breasts, stomach and back, but no lesions in those locations.  She has no lesions down the left leg or either arm.    PERTINENT  PMH / PSH: rheumatoid arthritis  OBJECTIVE:   BP 130/78   Pulse 75   Ht $R'5\' 9"'ry$  (1.753 m)   Wt 245 lb 3.2 oz (111.2 kg)   SpO2 99%   BMI 36.21 kg/m   General: alert, oriented.  Appears stated age Skin: pt has multiple large circular erythematous lesions along the right shin with scattered vesicles/pustules as well as the lateral dorsal aspect of her right foot. Largest lesion is on the anterolateral right hip.  See media tab  for pictures.   ASSESSMENT/PLAN:   Pruritic rash 2+ months of itching in the leg, back, and torso that turned into vesicular lesions that are mainly on the right leg and hip.  Has been treated at various times with clotrimasone, ivermectin, and oral steroids.  Pt is still having severe pruritus of these areas.  KOH was negative for hyphae.  At this point I am uncertain what the cause of this rash is. Patient is immunocompromised due to the medication she takes for rheumatoid arthritis. I considered the possibility of an L4 or L5 shingles outbreak due to the lesions, but the pruritus without lesions is also occurring in areas of different dermatomal distribution. Will get ESR and CRP.  Will send in referral to dermatology and schedule pt appointment with me  next week so I can do a thorough skin evaluation and biopsy and possible smear of vesicles.       Benay Pike, MD [place]

## 2020-02-01 ENCOUNTER — Ambulatory Visit (INDEPENDENT_AMBULATORY_CARE_PROVIDER_SITE_OTHER): Payer: Medicaid Other | Admitting: Family Medicine

## 2020-02-01 ENCOUNTER — Other Ambulatory Visit: Payer: Self-pay

## 2020-02-01 VITALS — BP 130/78 | HR 75 | Ht 69.0 in | Wt 245.2 lb

## 2020-02-01 DIAGNOSIS — R21 Rash and other nonspecific skin eruption: Secondary | ICD-10-CM

## 2020-02-01 DIAGNOSIS — L282 Other prurigo: Secondary | ICD-10-CM

## 2020-02-01 LAB — POCT SKIN KOH: Skin KOH, POC: NEGATIVE

## 2020-02-01 MED ORDER — TRIAMCINOLONE ACETONIDE 0.5 % EX CREA: 1.0000 "application " | TOPICAL_CREAM | Freq: Two times a day (BID) | CUTANEOUS | 0 refills | Status: DC

## 2020-02-01 NOTE — Patient Instructions (Addendum)
I have prescribed you a steroid cream to use twice a day for the next two weeks.  I have ordered some blood work as well. I have scheduled you with my clinic next week to perform a biopsy. I will also put in a referral to dermatology, but this may take a while to be seen by them.    Have a great day,   Frederic Jericho, MD

## 2020-02-02 LAB — C-REACTIVE PROTEIN: CRP: 4 mg/L (ref 0–10)

## 2020-02-02 LAB — SEDIMENTATION RATE: Sed Rate: 18 mm/hr (ref 0–40)

## 2020-02-02 NOTE — Assessment & Plan Note (Addendum)
2+ months of itching in the leg, back, and torso that turned into vesicular lesions that are mainly on the right leg and hip.  Has been treated at various times with clotrimasone, ivermectin, and oral steroids.  Pt is still having severe pruritus of these areas.  KOH was negative for hyphae.  At this point I am uncertain what the cause of this rash is. Patient is immunocompromised due to the medication she takes for rheumatoid arthritis. I considered the possibility of an L4 or L5 shingles outbreak due to the lesions, but the pruritus without lesions is also occurring in areas of different dermatomal distribution. Will get ESR and CRP.  Will send in referral to dermatology and schedule pt appointment with me next week so I can do a thorough skin evaluation and biopsy and possible smear of vesicles.

## 2020-02-07 NOTE — Progress Notes (Signed)
    SUBJECTIVE:   CHIEF COMPLAINT / HPI:   Biopsy of rash: Patient here for biopsy of her right lower extremity rash.  Patient had been applying the steroid cream that was given to her last time 4-5 times a day.  She was also rubbing the lotion into her entire leg instead of putting it on each lesion.  She feels like her rash has improved as well as her pruritus.  PERTINENT  PMH / PSH: Rheumatoid arthritis  OBJECTIVE:   BP 120/75   Pulse 77   Ht 5\' 9"  (1.753 m)   Wt 241 lb 12.8 oz (109.7 kg)   SpO2 99%   BMI 35.71 kg/m   General: Alert, oriented.  No acute distress. Skin: Improving lesions on the right leg.  Lesions now much smoother.  Still hyperpigmented.  No new lesions.  See media tab   ASSESSMENT/PLAN:   Pruritic rash Lesions responded to high potency steroid triamcinolone 0.5% cream.  Patient was applying this to skin more frequently than directed.  Obtain skin biopsy of singular right anterior shin lesion.  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  Will discontinue triamcinolone and switch to higher potency clobetasol cream.  Advised patient to only put on the lesions and not on normal skin.  Advised to have her put on twice a day.  Advised patient to come back in 2 weeks for recheck.  We will follow up biopsy results.   After informed written consent was obtained, using alcohol for cleansing and 1% Lidocaine with epinephrine for anesthetic, with sterile technique a 3 mm punch biopsy was used to obtain a biopsy specimen of the lesion. Hemostasis was obtained by pressure and wound was not sutured. Antibiotic dressing is applied, and wound care instructions provided. Be alert for any signs of cutaneous infection. The specimen is labeled and sent to pathology for evaluation. The procedure was well tolerated without complications.   , MD [facility]

## 2020-02-08 ENCOUNTER — Other Ambulatory Visit: Payer: Self-pay | Admitting: Family Medicine

## 2020-02-08 ENCOUNTER — Ambulatory Visit (INDEPENDENT_AMBULATORY_CARE_PROVIDER_SITE_OTHER): Payer: Medicaid Other | Admitting: Family Medicine

## 2020-02-08 ENCOUNTER — Encounter: Payer: Self-pay | Admitting: Family Medicine

## 2020-02-08 ENCOUNTER — Other Ambulatory Visit: Payer: Self-pay

## 2020-02-08 VITALS — BP 120/75 | HR 77 | Ht 69.0 in | Wt 241.8 lb

## 2020-02-08 DIAGNOSIS — L308 Other specified dermatitis: Secondary | ICD-10-CM | POA: Diagnosis not present

## 2020-02-08 DIAGNOSIS — L282 Other prurigo: Secondary | ICD-10-CM

## 2020-02-08 MED ORDER — CLOBETASOL PROPIONATE 0.05 % EX CREA: 1.0000 "application " | TOPICAL_CREAM | Freq: Two times a day (BID) | CUTANEOUS | 0 refills | Status: DC

## 2020-02-08 NOTE — Patient Instructions (Signed)
It was nice to see you again.    I will call you when I get the results of the biopsy.  In the meantime I would like you to switch to the high potency steroid that I am prescribing today.  I would like you to use that for the next 2 weeks and then follow back with [place] again to see how it is moving.  I would use his high potency steroid on the lesions on your leg and hip.  Try to avoid spreading it across your entire abdomen and instead put it only on the spots that look red and inflamed.  You can put emollients such as Vaseline or Eucerin cream on in between episodes of steroid cream to keep the area moist.  I will discuss this again with you in 2 weeks.  Have a great day,  Frederic Jericho, MD

## 2020-02-10 NOTE — Assessment & Plan Note (Signed)
Lesions responded to high potency steroid triamcinolone 0.5% cream.  Patient was applying this to skin more frequently than directed.  Obtain skin biopsy of singular right anterior shin lesion.  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  Will discontinue triamcinolone and switch to higher potency clobetasol cream.  Advised patient to only put on the lesions and not on normal skin.  Advised to have her put on twice a day.  Advised patient to come back in 2 weeks for recheck.  We will follow up biopsy results.

## 2020-02-16 DIAGNOSIS — Z419 Encounter for procedure for purposes other than remedying health state, unspecified: Secondary | ICD-10-CM | POA: Diagnosis not present

## 2020-02-26 NOTE — Progress Notes (Deleted)
    SUBJECTIVE:   CHIEF COMPLAINT / HPI:   Follow-up-pruritic rash  Patient is a 59 year old female that presents for follow-up after a pruritic rash 2 weeks ago.  At that time the patient had a biopsy which showed spongiotic dermatitis, suggestive of eczema as the cause of her lesions.  The provider saw her that time recommended continuing the steroid cream for flareups and use once a week for maintenance but also use daily emollient cream such as Eucerin or CeraVe.  Patient states***  PERTINENT  PMH / PSH: ***  OBJECTIVE:   There were no vitals taken for this visit.   General: NAD, pleasant, able to participate in exam Cardiac: RRR, no murmurs. Respiratory: CTAB, normal effort, No wheezes, rales or rhonchi Abdomen: Bowel sounds present, nontender, nondistended, no hepatosplenomegaly. Extremities: no edema or cyanosis. Skin: warm and dry, no rashes noted Neuro: alert, no obvious focal deficits Psych: Normal affect and mood  ASSESSMENT/PLAN:   No problem-specific Assessment & Plan notes found for this encounter.     Jackelyn Poling, DO [facility]    This note was prepared using Dragon voice recognition software and may include unintentional dictation errors due to the inherent limitations of voice recognition software.

## 2020-02-27 ENCOUNTER — Ambulatory Visit: Payer: Medicaid Other | Admitting: Family Medicine

## 2020-03-18 DIAGNOSIS — Z419 Encounter for procedure for purposes other than remedying health state, unspecified: Secondary | ICD-10-CM | POA: Diagnosis not present

## 2020-03-20 NOTE — Patient Instructions (Addendum)
It was great to see you!  Our plans for today:  -You were evaluated today for your rash.  I am sending in a refill of your clobetasol cream.  I want you to use this twice per day.  Try to keep it only on the lesions. -When you go upfront I would like for you to make a follow-up appointment with the dermatology clinic -I have sent in refills for your medications other than your Enbrel and methotrexate which came from your specialist.  I recommend that you contacted them for the refills. -I would like for you to keep an eye on your blood pressures at home, if you note blood pressures in the upper 130s or 140s or higher on a regular basis I would like for you to return for adjustment of your blood pressure medications. -Your A1c today is 6.3.     Take care and seek immediate care sooner if you develop any concerns.   Dr. [facility]

## 2020-03-20 NOTE — Progress Notes (Signed)
    SUBJECTIVE:   CHIEF COMPLAINT / HPI:   Rash follow-up Patient is a 59 year old female who presents today to discuss her ongoing rash.  Patient was previously evaluated for this and prescribed triamcinolone 0.5% cream.  A punch biopsy was also obtained during previous encounter which showed evidence of eczema.  Patient states that she had been using the cream about twice per week on the lesions which did do need to make some improvement from them but she has since ran out of the cream.  Diabetic Follow Up: Patient is a 59 y.o. female who present today for diabetic follow up.   Patient endorses no problems  Home medications include: Metformin 1000 twice daily Patient endorses taking these medications as prescribed.  Most recent A1Cs:  Lab Results  Component Value Date   HGBA1C 6.3 (A) 03/22/2020   HGBA1C 6.3 12/19/2019   HGBA1C 6.0 08/14/2019   Last Microalbumin, LDL, Creatinine: Lab Results  Component Value Date   MICROALBUR 80 09/23/2016   LDLCALC 81 04/19/2019   CREATININE 0.70 12/19/2019   Patient does not check blood glucose on a regular basis.  PERTINENT  PMH / PSH: History of diabetes  OBJECTIVE:   BP (!) 142/82   Pulse 88   Ht 5\' 9"  (1.753 m)   Wt 237 lb 3.2 oz (107.6 kg)   SpO2 99%   BMI 35.03 kg/m    142/80 on recheck.  Patient has checked her BP at home and states it has been around 127/70 on a regular basis.  General: NAD, pleasant, able to participate in exam Respiratory: No respiratory distress Extremities: no edema or cyanosis. Skin: Raised pruritic and slightly erythematous rash present on patient's right abdomen and left side.  Patient also has several 1 to 2 cm circular eczematous lesions present on her shin consistent with photos obtained during last appointment.  Patient does have some excoriations on her abdomen from scratching.     ASSESSMENT/PLAN:   Pruritic rash Assessment: Pruritic rash with biopsy evidence of eczema.  Patient  had.  Using her clobetasol cream but was using it twice per week rather than twice per day.  Patient endorsed some benefit from this cream but as she ran out of it her lesions quickly came back.  Physical exam significant for several lesions on her lower limbs as well as on her torso with evidence of excoriation.  Patient endorses these are mostly pruritic and not painful, consistent with her history of eczema. Plan: -Provided refill for the clobetasol cream with recommendation that she use it twice per day and try to keep it only on the lesions -Patient plans to make a follow-up appointment for her dermatology clinic should she not have further benefit from this -Follow-up as needed or if lesions do not improve in the next 4 weeks.  Type 2 diabetes mellitus without complication Assessment: Type 2 diabetes with well-controlled A1c only on Metformin 1000 mg twice daily.  A1c today consistent with previous A1c at 6.3.  Patient taking medications as prescribed. Plan: -Congratulated patient on keeping her A1c at a good controlled level -Continue Metformin 1000 mg twice per day -Follow-up A1c in about 6 months, if A1c is still well controlled at this time can discuss reducing Metformin dosage     , DO [facility]    This note was prepared using Dragon voice recognition software and may include unintentional dictation errors due to the inherent limitations of voice recognition software.

## 2020-03-22 ENCOUNTER — Ambulatory Visit (INDEPENDENT_AMBULATORY_CARE_PROVIDER_SITE_OTHER): Payer: Medicaid Other | Admitting: Family Medicine

## 2020-03-22 ENCOUNTER — Other Ambulatory Visit: Payer: Self-pay

## 2020-03-22 VITALS — BP 142/82 | HR 88 | Ht 69.0 in | Wt 237.2 lb

## 2020-03-22 DIAGNOSIS — E119 Type 2 diabetes mellitus without complications: Secondary | ICD-10-CM

## 2020-03-22 DIAGNOSIS — L299 Pruritus, unspecified: Secondary | ICD-10-CM | POA: Diagnosis not present

## 2020-03-22 DIAGNOSIS — I10 Essential (primary) hypertension: Secondary | ICD-10-CM | POA: Diagnosis not present

## 2020-03-22 DIAGNOSIS — B86 Scabies: Secondary | ICD-10-CM

## 2020-03-22 DIAGNOSIS — L282 Other prurigo: Secondary | ICD-10-CM

## 2020-03-22 DIAGNOSIS — L309 Dermatitis, unspecified: Secondary | ICD-10-CM | POA: Diagnosis not present

## 2020-03-22 LAB — POCT GLYCOSYLATED HEMOGLOBIN (HGB A1C): Hemoglobin A1C: 6.3 % — AB (ref 4.0–5.6)

## 2020-03-22 MED ORDER — ATORVASTATIN CALCIUM 40 MG PO TABS: ORAL_TABLET | ORAL | 1 refills | Status: DC

## 2020-03-22 MED ORDER — AMLODIPINE BESYLATE 10 MG PO TABS: 10.0000 mg | ORAL_TABLET | Freq: Every day | ORAL | 1 refills | Status: DC

## 2020-03-22 MED ORDER — LISINOPRIL 40 MG PO TABS: 40.0000 mg | ORAL_TABLET | Freq: Every day | ORAL | 3 refills | Status: DC

## 2020-03-22 MED ORDER — HYDROXYZINE HCL 10 MG PO TABS: 10.0000 mg | ORAL_TABLET | Freq: Three times a day (TID) | ORAL | 0 refills | Status: DC | PRN

## 2020-03-22 MED ORDER — HYDROCHLOROTHIAZIDE 25 MG PO TABS: 25.0000 mg | ORAL_TABLET | Freq: Every day | ORAL | 1 refills | Status: DC

## 2020-03-22 MED ORDER — CLOBETASOL PROPIONATE 0.05 % EX CREA: 1.0000 "application " | TOPICAL_CREAM | Freq: Two times a day (BID) | CUTANEOUS | 0 refills | Status: DC

## 2020-03-22 NOTE — Assessment & Plan Note (Signed)
Assessment: Pruritic rash with biopsy evidence of eczema.  Patient had.  Using her clobetasol cream but was using it twice per week rather than twice per day.  Patient endorsed some benefit from this cream but as she ran out of it her lesions quickly came back.  Physical exam significant for several lesions on her lower limbs as well as on her torso with evidence of excoriation.  Patient endorses these are mostly pruritic and not painful, consistent with her history of eczema. Plan: -Provided refill for the clobetasol cream with recommendation that she use it twice per day and try to keep it only on the lesions -Patient plans to make a follow-up appointment for her dermatology clinic should she not have further benefit from this -Follow-up as needed or if lesions do not improve in the next 4 weeks.

## 2020-03-22 NOTE — Assessment & Plan Note (Signed)
Assessment: Type 2 diabetes with well-controlled A1c only on Metformin 1000 mg twice daily.  A1c today consistent with previous A1c at 6.3.  Patient taking medications as prescribed. Plan: -Congratulated patient on keeping her A1c at a good controlled level -Continue Metformin 1000 mg twice per day -Follow-up A1c in about 6 months, if A1c is still well controlled at this time can discuss reducing Metformin dosage

## 2020-04-02 DIAGNOSIS — M0579 Rheumatoid arthritis with rheumatoid factor of multiple sites without organ or systems involvement: Secondary | ICD-10-CM | POA: Diagnosis not present

## 2020-04-02 DIAGNOSIS — Z79899 Other long term (current) drug therapy: Secondary | ICD-10-CM | POA: Diagnosis not present

## 2020-04-17 DIAGNOSIS — Z419 Encounter for procedure for purposes other than remedying health state, unspecified: Secondary | ICD-10-CM | POA: Diagnosis not present

## 2020-04-18 ENCOUNTER — Other Ambulatory Visit: Payer: Self-pay

## 2020-04-18 ENCOUNTER — Ambulatory Visit (INDEPENDENT_AMBULATORY_CARE_PROVIDER_SITE_OTHER): Payer: Medicaid Other | Admitting: Family Medicine

## 2020-04-18 VITALS — BP 125/80 | HR 94 | Ht 69.0 in | Wt 236.8 lb

## 2020-04-18 DIAGNOSIS — L309 Dermatitis, unspecified: Secondary | ICD-10-CM | POA: Diagnosis not present

## 2020-04-18 MED ORDER — TRIAMCINOLONE ACETONIDE 0.5 % EX OINT
1.0000 | TOPICAL_OINTMENT | Freq: Two times a day (BID) | CUTANEOUS | 0 refills | Status: DC
Start: 2020-04-18 — End: 2020-06-12

## 2020-04-18 NOTE — Progress Notes (Signed)
    SUBJECTIVE:   CHIEF COMPLAINT / HPI:   Tanya Owens is a 59 yo who presents to follow up on her eczema. She started using Clobetasol cream twice a day since her last visit which she says has reduced her itching by about 98%. Does still endorse some itching, particularly on her right lower leg. She denies using any lotion or Vaseline. Denies any changes in detergents or soaps (uses [place]). No longer has itching at night. Denies any other concerns.   PERTINENT  PMH / PSH:  Eczema   OBJECTIVE:   BP 125/80   Pulse 94   Ht 5\' 9"  (1.753 m)   Wt 236 lb 12.8 oz (107.4 kg)   SpO2 99%   BMI 34.97 kg/m    General: well appearing, NAD Respiratory: breathing comfortably on RA Skin: warm, dry. diffuse papular and hyperpigmented lichenified and excoriated areas consistent with eczema   ASSESSMENT/PLAN:   No problem-specific Assessment & Plan notes found for this encounter.   Eczema  Biopsy taken on 9/23 indicating eczematous reaction including a contact dermatitis or a nummular dermatitis - start using Vaseline daily for hydration after showering  - switch from clobetasol cream to triamcinolone ointment 0.5% BID   10/23, DO [facility]

## 2020-04-18 NOTE — Patient Instructions (Addendum)
It was great seeing you today! Today you came in for skin that has been itching. We are glad it is getting better!   Continue to use your current cream until it runs out. We are prescribing an ointment called Triamcinolone to be used twice a day when your cream runs out. Also be sure to apply Vaseline daily when you get out of the shower to help keep your skin moisturized.   Visit Reminders: - Stop by the pharmacy to pick up your prescriptions   Feel free to call with any questions or concerns at any time, at [phone].   Take care,  Dr. Cora Collum [facility]

## 2020-05-08 ENCOUNTER — Other Ambulatory Visit: Payer: Self-pay | Admitting: *Deleted

## 2020-05-08 ENCOUNTER — Other Ambulatory Visit: Payer: Self-pay

## 2020-05-08 NOTE — Patient Outreach (Signed)
Care Coordination - Case Manager  05/08/2020  Tanya Owens 1960/12/09 [id number]  Subjective:  Tanya Owens is an 59 y.o. year old female who is a primary patient of Jackelyn Poling, DO.  [name] was given information about Medicaid Managed Care team care coordination services today. [name] agreed to services and verbal consent obtained  Review of patient status, laboratory and other test data was performed as part of evaluation for provision of services.  SDOH: SDOH Screenings   Alcohol Screen: Not on file  Depression (PHQ2-9): Low Risk   . PHQ-2 Score: 0  Financial Resource Strain: Not on file  Food Insecurity: Not on file  Housing: Not on file  Physical Activity: Not on file  Social Connections: Not on file  Stress: Not on file  Tobacco Use: Medium Risk  . Smoking Tobacco Use: Former Smoker  . Smokeless Tobacco Use: Never Used  Transportation Needs: Not on file     Objective:    Allergies  Allergen Reactions  . Tylenol [Acetaminophen] Other (See Comments)    Stomach cramps     Medications:    Medications Reviewed Today    Reviewed by Heidi Dach, RN (Registered Nurse) on 05/08/20 at 1544  Med List Status: <None>  Medication Order Taking? Sig Documenting Provider Last Dose Status Informant  amLODipine (NORVASC) 10 MG tablet [id number] Yes Take 1 tablet (10 mg total) by mouth daily. Jackelyn Poling, DO Taking Active   atorvastatin (LIPITOR) 40 MG tablet [id number] Yes take 1 tablet by mouth once daily Jackelyn Poling, DO Taking Active   clobetasol cream (TEMOVATE) 0.05 % [id number] Yes Apply 1 application topically 2 (two) times daily. Jackelyn Poling, DO Taking Active   Etanercept 50 MG/ML SOCT [id number] Yes Inject into the skin. [provider] Taking Active   hydrochlorothiazide (HYDRODIURIL) 25 MG tablet [id number] Yes Take 1 tablet (25 mg total) by mouth daily. Jackelyn Poling, DO Taking Active   hydrOXYzine (ATARAX/VISTARIL) 10 MG  tablet [id number] Yes Take 1 tablet (10 mg total) by mouth 3 (three) times daily as needed. Jackelyn Poling, DO Taking Active   lisinopril (ZESTRIL) 40 MG tablet [id number] Yes Take 1 tablet (40 mg total) by mouth daily. Jackelyn Poling, DO Taking Active   metFORMIN (GLUCOPHAGE) 1000 MG tablet [id number] Yes TAKE 1 TABLET(1000 MG) BY MOUTH TWICE DAILY WITH A MEAL [name], [name], DO Taking Active   methotrexate (RHEUMATREX) 5 MG tablet [id number] Yes Take 6 tablets (30 mg total) by mouth once a week. Caution: Chemotherapy. Protect from light. Leland Her, DO Taking Active   oxyCODONE (OXY IR/ROXICODONE) 5 MG immediate release tablet [id number] No Take 1 tablet (5 mg total) by mouth 3 (three) times daily as needed for moderate pain.  Patient not taking: No sig reported   Jones Bales, NP Not Taking Active   RESTASIS 0.05 % ophthalmic emulsion [id number] No Place 1 drop into both eyes 2 (two) times daily. Both eyes  Patient not taking: No sig reported   Leland Her, DO Not Taking Active   triamcinolone ointment (KENALOG) 0.5 % [id number] Yes Apply 1 application topically 2 (two) times daily. Cora Collum, DO Taking Active           Assessment:   Patient Care Plan: Managing Eczema    Problem Identified: Eczema Flare-up     Goal: Effectively Manage Eczema   Start Date: 05/08/2020  Expected End Date: 06/12/2020  This Visit's Progress: On track  Priority: High  Note:   Current Barriers:  . Chronic Disease Management support and education needs related to managing eczema  Nurse Case Manager Clinical Goal(s):  Marland Kitchen Over the next 30 days, patient will meet with RN Care Manager to address eczema management . Over the next 30 days, patient will attend all scheduled medical appointments  Interventions:  . Inter-disciplinary care team collaboration (see longitudinal plan of care) . Evaluation of current treatment plan related to eczema and patient's adherence to plan as established by  provider. . Advised patient to use prescribed medicated cream only on affected area, take a warm shower/bath(not hot), cool compress when feeling itchy, take prescribed hydoxyzine to help with itching, apply Vaseline to skin after bathing . Reviewed medications with patient and discussed how to apply cream for eczema . Discussed plans with patient for ongoing care management follow up and provided patient with direct contact information for care management team  Patient Goals/Self-Care Activities Over the next 30 days, patient will:  -Patient verbalizes understanding of plan to manage eczema flares  -Self administers medications as prescribed  -Attends all scheduled provider appointments  -Calls pharmacy for medication refills  -Calls provider office for new concerns or questions  Follow Up Plan: Telephone follow up appointment with Managed Medicaid care management team member scheduled for:06/12/20 @ 2:30pm         Plan: RNCM will follow up with a telephone call on 06/12/20 @ 2:30pm Follow-up:  Patient agrees to Care Plan and Follow-up.

## 2020-05-08 NOTE — Patient Instructions (Signed)
Visit Information  Tanya Owens was given information about Medicaid Managed Care team care coordination services as a part of their [facility] Medicaid benefit. Tanya Owens verbally consented to engagement with the [facility] Managed Care team.   For questions related to your [facility] health plan, please call: [phone]  If you would like to schedule transportation through your [facility] plan, please call the following number at least 2 days in advance of your appointment: [phone]  Goals Addressed   None    Patient Care Plan: Managing Eczema    Problem Identified: Eczema Flare-up     Goal: Effectively Manage Eczema   Start Date: 05/08/2020  Expected End Date: 06/12/2020  This Visit's Progress: On track  Priority: High  Note:   Current Barriers:   Chronic Disease Management support and education needs related to managing eczema  Nurse Case Manager Clinical Goal(s):   Over the next 30 days, patient will meet with RN Care Manager to address eczema management  Over the next 30 days, patient will attend all scheduled medical appointments  Interventions:   Inter-disciplinary care team collaboration (see longitudinal plan of care)  Evaluation of current treatment plan related to eczema and patient's adherence to plan as established by provider.  Advised patient to use prescribed medicated cream only on affected area, take a warm shower/bath(not hot), cool compress when feeling itchy, take prescribed hydoxyzine to help with itching, apply Vaseline to skin after bathing  Reviewed medications with patient and discussed how to apply cream for eczema  Discussed plans with patient for ongoing care management follow up and provided patient with direct contact information for care management team  Patient Goals/Self-Care Activities Over the next 30 days, patient will:  -Patient verbalizes understanding of plan to manage eczema flares  -Self administers  medications as prescribed  -Attends all scheduled provider appointments  -Calls pharmacy for medication refills  -Calls provider office for new concerns or questions  Follow Up Plan: Telephone follow up appointment with Managed Medicaid care management team member scheduled for:06/12/20 @ 2:30pm        Please see education materials related to eczema provided by MyChart link.    Eczema Eczema is a broad term for a group of skin conditions that cause skin to become rough and inflamed. Each type of eczema has different triggers, symptoms, and treatments. Eczema of any type is usually itchy and symptoms range from mild to severe. Eczema and its symptoms are not spread from person to person (are not contagious). It can appear on different parts of the body at different times. Your eczema may not look the same as someone else's eczema. What are the types of eczema? Atopic dermatitis This is a long-term (chronic) skin disease that keeps coming back (recurring). Usual symptoms are dry skin and small, solid pimples that may swell and leak fluid (weep). Contact dermatitis  This happens when something irritates the skin and causes a rash. The irritation can come from substances that you are allergic to (allergens), such as poison ivy, chemicals, or medicines that were applied to your skin. Dyshidrotic eczema This is a form of eczema on the hands and feet. It shows up as very itchy, fluid-filled blisters. It can affect people of any age, but is more common before age 55. Hand eczema  This causes very itchy areas of skin on the palms and sides of the hands and fingers. This type of eczema is common in industrial jobs where you may be exposed to many  different types of irritants. Lichen simplex chronicus This type of eczema occurs when a person constantly scratches one area of the body. Repeated scratching of the area leads to thickened skin (lichenification). Lichen simplex chronicus can occur along  with other types of eczema. It is more common in adults, but may be seen in children as well. Nummular eczema This is a common type of eczema. It has no known cause. It typically causes a red, circular, crusty lesion (plaque) that may be itchy. Scratching may become a habit and can cause bleeding. Nummular eczema occurs most often in people of middle-age or older. It most often affects the hands. Seborrheic dermatitis This is a common skin disease that mainly affects the scalp. It may also affect any oily areas of the body, such as the face, sides of nose, eyebrows, ears, eyelids, and chest. It is marked by small scaling and redness of the skin (erythema). This can affect people of all ages. In infants, this condition is known as Location manager." Stasis dermatitis This is a common skin disease that usually appears on the legs and feet. It most often occurs in people who have a condition that prevents blood from being pumped through the veins in the legs (chronic venous insufficiency). Stasis dermatitis is a chronic condition that needs long-term management. How is eczema diagnosed? Your health care provider will examine your skin and review your medical history. He or she may also give you skin patch tests. These tests involve taking patches that contain possible allergens and placing them on your back. He or she will then check in a few days to see if an allergic reaction occurred. What are the common treatments? Treatment for eczema is based on the type of eczema you have. Hydrocortisone steroid medicine can relieve itching quickly and help reduce inflammation. This medicine may be prescribed or obtained over-the-counter, depending on the strength of the medicine that is needed. Follow these instructions at home:  Take over-the-counter and prescription medicines only as told by your health care provider.  Use creams or ointments to moisturize your skin. Do not use lotions.  Learn what triggers or  irritates your symptoms. Avoid these things.  Treat symptom flare-ups quickly.  Do not itch your skin. This can make your rash worse.  Keep all follow-up visits as told by your health care provider. This is important. Where to find more information  The American Academy of Dermatology: InfoExam.si  The National Eczema Association: www.nationaleczema.org Contact a health care provider if:  You have serious itching, even with treatment.  You regularly scratch your skin until it bleeds.  Your rash looks different than usual.  Your skin is painful, swollen, or more red than usual.  You have a fever. Summary  There are eight general types of eczema. Each type has different triggers.  Eczema of any type causes itching that may range from mild to severe.  Treatment varies based on the type of eczema you have. Hydrocortisone steroid medicine can help with itching and inflammation.  Protecting your skin is the best way to prevent eczema. Use moisturizers and lotions. Avoid triggers and irritants, and treat flare-ups quickly. This information is not intended to replace advice given to you by your health care provider. Make sure you discuss any questions you have with your health care provider. Document Revised: 04/16/2017 Document Reviewed: 09/17/2016 Elsevier Patient Education  2020 [employer].   Patient verbalizes understanding of instructions provided today.   Telephone follow up appointment with Managed  Medicaid care management team member scheduled for:06/12/20 @ 2:30pm  Heidi Dach, RN

## 2020-05-18 DIAGNOSIS — Z419 Encounter for procedure for purposes other than remedying health state, unspecified: Secondary | ICD-10-CM | POA: Diagnosis not present

## 2020-06-01 ENCOUNTER — Other Ambulatory Visit: Payer: Self-pay | Admitting: Family Medicine

## 2020-06-01 DIAGNOSIS — E119 Type 2 diabetes mellitus without complications: Secondary | ICD-10-CM

## 2020-06-08 ENCOUNTER — Other Ambulatory Visit: Payer: Self-pay

## 2020-06-08 ENCOUNTER — Ambulatory Visit
Admission: EM | Admit: 2020-06-08 | Discharge: 2020-06-08 | Disposition: A | Discharge status: Home / Self Care | Payer: Medicaid Other | Attending: Emergency Medicine | Admitting: Emergency Medicine

## 2020-06-08 ENCOUNTER — Encounter: Payer: Self-pay | Admitting: Emergency Medicine

## 2020-06-08 DIAGNOSIS — N390 Urinary tract infection, site not specified: Secondary | ICD-10-CM | POA: Diagnosis not present

## 2020-06-08 LAB — POCT URINALYSIS DIP (MANUAL ENTRY)
Bilirubin, UA: NEGATIVE
Glucose, UA: NEGATIVE mg/dL
Nitrite, UA: NEGATIVE
Protein Ur, POC: 100 mg/dL — AB
Spec Grav, UA: 1.02 (ref 1.010–1.025)
Urobilinogen, UA: 0.2 E.U./dL
pH, UA: 6 (ref 5.0–8.0)

## 2020-06-08 MED ORDER — CEPHALEXIN 500 MG PO CAPS: 500.0000 mg | ORAL_CAPSULE | Freq: Two times a day (BID) | ORAL | 0 refills | Status: AC

## 2020-06-08 MED ORDER — PHENAZOPYRIDINE HCL 200 MG PO TABS
200.0000 mg | ORAL_TABLET | Freq: Three times a day (TID) | ORAL | 0 refills | Status: DC
Start: 2020-06-08 — End: 2023-06-01

## 2020-06-08 NOTE — ED Triage Notes (Signed)
Pt presents today with c/o of urine frequency and pain x 6 days. Has been taking AZO with no relief.

## 2020-06-08 NOTE — ED Provider Notes (Signed)
[facility]    CSN: [id number] Arrival date & time: 06/08/20  1051      History   Chief Complaint No chief complaint on file. UTI  HPI Tanya Owens is a 60 y.o. female history of hypertension, DM type II, presenting today for evaluation of possible UTI.  Reports over the past 6 days she has had dysuria, urinary frequency and urgency.  Has had some lower abdominal pressure.  Denies any known fevers, but low-grade fever noted in clinic today.  Has been using over-the-counter Azo and cranberry and drinking plenty of fluids.  Denies history of UTIs.  Denies any vaginal discharge itching or irritation.  HPI  Past Medical History:  Diagnosis Date  . Arthritis 2000  . Diabetes mellitus without complication (HCC) 2005  . Hypercholesteremia 2005  . Hypertension 2005  . Sleep apnea 2010  . Sleep disorder     Patient Active Problem List   Diagnosis Date Noted  . Pruritic rash 01/15/2020  . Bilateral chronic knee pain 09/28/2019  . Other chronic pain 08/14/2019  . COVID-19 05/30/2019  . Seronegative arthropathy of multiple sites (HCC) 07/30/2017  . Encounter for medication management 04/19/2017  . Acromioclavicular joint arthritis 03/26/2017  . Frequent No-show for appointment 12/15/2016  . Cyclic citrullinated peptide (CCP) antibody positive 10/28/2016  . Obesity (BMI 30-39.9) 11/28/2014  . Type 2 diabetes mellitus without complication (HCC) 11/28/2014  . HTN (hypertension) 06/21/2013  . Mixed hyperlipidemia 06/21/2013    Past Surgical History:  Procedure Laterality Date  . CARDIAC CATHETERIZATION  10/18/2007   Normal coronaries, systemic hypertension    OB History   No obstetric history on file.      Home Medications    Prior to Admission medications   Medication Sig Start Date End Date Taking? Authorizing Provider  cephALEXin (KEFLEX) 500 MG capsule Take 1 capsule (500 mg total) by mouth 2 (two) times daily for 7 days. 06/08/20 06/15/20 Yes Jazell Rosenau,  Tomislav Micale C, PA-C  phenazopyridine (PYRIDIUM) 200 MG tablet Take 1 tablet (200 mg total) by mouth 3 (three) times daily. 06/08/20  Yes Karmina Zufall C, PA-C  amLODipine (NORVASC) 10 MG tablet Take 1 tablet (10 mg total) by mouth daily. 03/22/20   Jackelyn Poling, DO  atorvastatin (LIPITOR) 40 MG tablet take 1 tablet by mouth once daily 03/22/20   Jackelyn Poling, DO  clobetasol cream (TEMOVATE) 0.05 % Apply 1 application topically 2 (two) times daily. 03/22/20   Jackelyn Poling, DO  Etanercept 50 MG/ML SOCT Inject into the skin. 09/20/17   [provider]  hydrochlorothiazide (HYDRODIURIL) 25 MG tablet Take 1 tablet (25 mg total) by mouth daily. 03/22/20   Jackelyn Poling, DO  hydrOXYzine (ATARAX/VISTARIL) 10 MG tablet Take 1 tablet (10 mg total) by mouth 3 (three) times daily as needed. 03/22/20   Jackelyn Poling, DO  lisinopril (ZESTRIL) 40 MG tablet Take 1 tablet (40 mg total) by mouth daily. 03/22/20   Jackelyn Poling, DO  metFORMIN (GLUCOPHAGE) 1000 MG tablet TAKE 1 TABLET(1000 MG) BY MOUTH TWICE DAILY WITH A MEAL 06/04/20   [name], [name], DO  methotrexate (RHEUMATREX) 5 MG tablet Take 6 tablets (30 mg total) by mouth once a week. Caution: Chemotherapy. Protect from light. 12/13/17   Leland Her, DO  oxyCODONE (OXY IR/ROXICODONE) 5 MG immediate release tablet Take 1 tablet (5 mg total) by mouth 3 (three) times daily as needed for moderate pain. Patient not taking: No sig reported 07/31/19   Jones Bales, NP  RESTASIS  0.05 % ophthalmic emulsion Place 1 drop into both eyes 2 (two) times daily. Both eyes Patient not taking: No sig reported 04/20/18   Leland Her, DO  triamcinolone ointment (KENALOG) 0.5 % Apply 1 application topically 2 (two) times daily. 04/18/20   Cora Collum, DO    Family History Family History  Problem Relation Age of Onset  . Heart attack Mother   . Diabetes Mother   . Hypercholesterolemia Mother   . Hypertension Mother   . Diabetes Sister   . Hypertension Brother      Social History Social History   Tobacco Use  . Smoking status: Former Smoker    Quit date: 12/27/2012    Years since quitting: 7.4  . Smokeless tobacco: Never Used  Vaping Use  . Vaping Use: Never used  Substance Use Topics  . Alcohol use: No  . Drug use: No     Allergies   Tylenol [acetaminophen]   Review of Systems Review of Systems  Constitutional: Negative for fever.  Respiratory: Negative for shortness of breath.   Cardiovascular: Negative for chest pain.  Gastrointestinal: Negative for abdominal pain, diarrhea, nausea and vomiting.  Genitourinary: Positive for dysuria. Negative for flank pain, genital sores, hematuria, menstrual problem, vaginal bleeding, vaginal discharge and vaginal pain.  Musculoskeletal: Negative for back pain.  Skin: Negative for rash.  Neurological: Negative for dizziness, light-headedness and headaches.     Physical Exam Triage Vital Signs ED Triage Vitals  Enc Vitals Group     BP      Pulse      Resp      Temp      Temp src      SpO2      Weight      Height      Head Circumference      Peak Flow      Pain Score      Pain Loc      Pain Edu?      Excl. in GC?    No data found.  Updated Vital Signs BP (!) 151/93 (BP Location: Left Arm)   Pulse (!) 107   Temp 99.6 F (37.6 C) (Oral)   Resp 16   SpO2 98%   Visual Acuity Right Eye Distance:   Left Eye Distance:   Bilateral Distance:    Right Eye Near:   Left Eye Near:    Bilateral Near:     Physical Exam Vitals and nursing note reviewed.  Constitutional:      Appearance: She is well-developed and well-nourished.     Comments: No acute distress  HENT:     Head: Normocephalic and atraumatic.     Nose: Nose normal.  Eyes:     Conjunctiva/sclera: Conjunctivae normal.  Cardiovascular:     Rate and Rhythm: Normal rate and regular rhythm.  Pulmonary:     Effort: Pulmonary effort is normal. No respiratory distress.     Comments: Breathing comfortably at rest,  CTABL, no wheezing, rales or other adventitious sounds auscultated  Abdominal:     General: There is no distension.     Comments: Soft, nondistended, suprapubic tenderness, negative rebound  Musculoskeletal:        General: Normal range of motion.     Cervical back: Neck supple.  Skin:    General: Skin is warm and dry.  Neurological:     Mental Status: She is alert and oriented to person, place, and time.  Psychiatric:  Mood and Affect: Mood and affect normal.      UC Treatments / Results  Labs (all labs ordered are listed, but only abnormal results are displayed) Labs Reviewed  POCT URINALYSIS DIP (MANUAL ENTRY) - Abnormal; Notable for the following components:      Result Value   Clarity, UA cloudy (*)    Ketones, POC UA trace (5) (*)    Blood, UA large (*)    Protein Ur, POC =100 (*)    Leukocytes, UA Large (3+) (*)    All other components within normal limits  URINE CULTURE    EKG   Radiology No results found.  Procedures Procedures (including critical care time)  Medications Ordered in UC Medications - No data to display  Initial Impression / Assessment and Plan / UC Course  I have reviewed the triage vital signs and the nursing notes.  Pertinent labs & imaging results that were available during my care of the patient were reviewed by me and considered in my medical decision making (see chart for details).     UA consistent with UTI, urine culture pending.  Treating with Keflex twice daily x1 week, does have low-grade fever today, push fluids.  Pyridium for dysuria.  Monitor for gradual resolution of symptoms. Discussed strict return precautions. Patient verbalized understanding and is agreeable with plan.  Final Clinical Impressions(s) / UC Diagnoses   Final diagnoses:  Lower urinary tract infection, acute     Discharge Instructions     Urine showed evidence of infection. We are treating you with Keflex twice daily for 1 week. Be sure to  take full course. Stay hydrated- urine should be pale yellow to clear.  May use Pyridium to help with painful urination.  Please return or follow up with your primary provider if symptoms not improving with treatment. Please return sooner if you have worsening of symptoms or develop fever, nausea, vomiting, abdominal pain, back pain, lightheadedness, dizziness.    ED Prescriptions    Medication Sig Dispense Auth. Provider   cephALEXin (KEFLEX) 500 MG capsule Take 1 capsule (500 mg total) by mouth 2 (two) times daily for 7 days. 14 capsule Akera Snowberger C, PA-C   phenazopyridine (PYRIDIUM) 200 MG tablet Take 1 tablet (200 mg total) by mouth 3 (three) times daily. 6 tablet Janetta Vandoren, [place] C, PA-C     PDMP not reviewed this encounter.   Lew Dawes, [place] 06/08/20 1123

## 2020-06-08 NOTE — Discharge Instructions (Addendum)
Urine showed evidence of infection. We are treating you with Keflex twice daily for 1 week. Be sure to take full course. Stay hydrated- urine should be pale yellow to clear.  May use Pyridium to help with painful urination.  Please return or follow up with your primary provider if symptoms not improving with treatment. Please return sooner if you have worsening of symptoms or develop fever, nausea, vomiting, abdominal pain, back pain, lightheadedness, dizziness.

## 2020-06-10 LAB — URINE CULTURE: Culture: 70000 — AB

## 2020-06-12 ENCOUNTER — Other Ambulatory Visit: Payer: Self-pay | Admitting: *Deleted

## 2020-06-12 ENCOUNTER — Other Ambulatory Visit: Payer: Self-pay

## 2020-06-12 ENCOUNTER — Other Ambulatory Visit: Payer: Self-pay | Admitting: Family Medicine

## 2020-06-12 MED ORDER — TRIAMCINOLONE ACETONIDE 0.5 % EX OINT: 1.0000 "application " | TOPICAL_OINTMENT | Freq: Two times a day (BID) | CUTANEOUS | 0 refills | Status: DC

## 2020-06-12 NOTE — Patient Instructions (Signed)
Visit Information  Ms. Mcneice was given information about Medicaid Managed Care team care coordination services as a part of their [facility] Medicaid benefit. [name] verbally consented to engagement with the [facility] Managed Care team.   For questions related to your [facility] health plan, please call: [phone]  If you would like to schedule transportation through your [facility] plan, please call the following number at least 2 days in advance of your appointment: [phone]  Ms. Tidmore - following are the goals we discussed in your visit today:  Goals Addressed   None     Please see education materials related to eczema provided by MyChart link.    Eczema Eczema refers to a group of skin conditions that cause skin to become rough and inflamed. Each type of eczema has different triggers, symptoms, and treatments. Eczema of any type is usually itchy. Symptoms range from mild to severe. Eczema is not spread from person to person (is not contagious). It can appear on different parts of the body at different times. One person's eczema may look different from another person's eczema. What are the causes? The exact cause of this condition is not known. However, exposure to certain environmental factors, irritants, and allergens can make the condition worse. What are the signs or symptoms? Symptoms of this condition depend on the type of eczema you have. The types include:  Contact dermatitis. There are two kinds: ? Irritant contact dermatitis. This happens when something irritates the skin and causes a rash. ? Allergic contact dermatitis. This happens when your skin comes in contact with something you are allergic to (allergens). This can include poison ivy, chemicals, or medicines that were applied to your skin.  Atopic dermatitis. This is a long-term (chronic) skin disease that keeps coming back (recurring). It is the most common type of eczema. Usual  symptoms are a red rash and itchy, dry, scaly skin. It usually starts showing signs in infancy and can last through adulthood.  Dyshidrotic eczema. This is a form of eczema on the hands and feet. It shows up as very itchy, fluid-filled blisters. It can affect people of any age but is more common before age 41.  Hand eczema. This causes very itchy areas of skin on the palms and sides of the hands and fingers. This type of eczema is common in industrial jobs where you may be exposed to different types of irritants.  Lichen simplex chronicus. This type of eczema occurs when a person constantly scratches one area of the body. Repeated scratching of the area leads to thickened skin (lichenification). This condition can accompany other types of eczema. It is more common in adults but may also be seen in children.  Nummular eczema. This is a common type of eczema that most often affects the lower legs and the backs of the hands. It typically causes an itchy, red, circular, crusty lesion (plaque). Scratching may become a habit and can cause bleeding. Nummular eczema occurs most often in middle-aged or older people.  Seborrheic dermatitis. This is a common skin disease that mainly affects the scalp. It may also affect other oily areas of the body, such as the face, sides of the nose, eyebrows, ears, eyelids, and chest. It is marked by small scaling and redness of the skin (erythema). This can affect people of all ages. In infants, this condition is called cradle cap.  Stasis dermatitis. This is a common skin disease that can cause itching, scaling, and hyperpigmentation, usually on the  legs and feet. It occurs most often in people who have a condition that prevents blood from being pumped through the veins in the legs (chronic venous insufficiency). Stasis dermatitis is a chronic condition that needs long-term management.   How is this diagnosed? This condition may be diagnosed based on:  A physical exam of  your skin.  Your medical history.  Skin patch tests. These tests involve using patches that contain possible allergens and placing them on your back. Your health care provider will check in a few days to see if an allergic reaction occurred. How is this treated? Treatment for eczema is based on the type of eczema you have. You may be given hydrocortisone steroid medicine or antihistamines. These can relieve itching quickly and help reduce inflammation. These may be prescribed or purchased over the counter, depending on the strength that is needed. Follow these instructions at home:  Take or apply over-the-counter and prescription medicines only as told by your health care provider.  Use creams or ointments to moisturize your skin. Do not use lotions.  Learn what triggers or irritates your symptoms so you can avoid these things.  Treat symptom flare-ups quickly.  Do not scratch your skin. This can make your rash worse.  Keep all follow-up visits. This is important. Where to find more information  American Academy of Dermatology: MemberVerification.ca  National Eczema Association: nationaleczema.org  The Society for Pediatric Dermatology: pedsderm.net Contact a health care provider if:  You have severe itching, even with treatment.  You scratch your skin regularly until it bleeds.  Your rash looks different than usual.  Your skin is painful, swollen, or more red than usual.  You have a fever. Summary  Eczema refers to a group of skin conditions that cause skin to become rough and inflamed. Each type has different triggers.  Eczema of any type causes itching that may range from mild to severe.  Treatment varies based on the type of eczema you have. Hydrocortisone steroid medicine or antihistamines can help with itching and inflammation.  Protecting your skin is the best way to prevent eczema. Use creams or ointments to moisturize your skin. Avoid triggers and irritants. Treat flare-ups  quickly. This information is not intended to replace advice given to you by your health care provider. Make sure you discuss any questions you have with your health care provider. Document Revised: 02/12/2020 Document Reviewed: 02/12/2020 Elsevier Patient Education  2021 [place].   Patient verbalizes understanding of instructions provided today.   The patient has been provided with contact information for the Managed Medicaid care management team and has been advised to call with any health related questions or concerns.   Melissa Montane, RN  Following is a copy of your plan of care:  Patient Care Plan: Managing Eczema    Problem Identified: Eczema Flare-up     Goal: Effectively Manage Eczema   Start Date: 05/08/2020  Expected End Date: 06/12/2020  This Visit's Progress: On track  Recent Progress: On track  Priority: High  Note:   Current Barriers:  . Chronic Disease Management support and education needs related to managing eczema  Nurse Case Manager Clinical Goal(s):  Marland Kitchen Over the next 30 days, patient will meet with RN Care Manager to address eczema management-Update-Patient reports doing "ok" the eczema comes and goes, the prescribed cream helps . Over the next 30 days, patient will attend all scheduled medical appointments-Met  Interventions:  . Inter-disciplinary care team collaboration (see longitudinal plan of care) .  Evaluation of current treatment plan related to eczema and patient's adherence to plan as established by provider. . Advised patient to use prescribed medicated cream only on affected area, take a warm shower/bath(not hot), cool compress when feeling itchy, take prescribed hydoxyzine to help with itching, apply Vaseline to skin after bathing . Reviewed medications with patient and discussed how to apply cream for eczema . Discussed plans with patient for ongoing care management follow up and provided patient with direct contact information for care  management team . Explained to patient, to call pharmacy to request refill for cream  Patient Goals/Self-Care Activities Over the next 30 days, patient will:  -Patient verbalizes understanding of plan to manage eczema flares  -Self administers medications as prescribed  -Attends all scheduled provider appointments  -Calls pharmacy for medication refills  -Calls provider office for new concerns or questions  Follow Up Plan: The patient has been provided with contact information for the Managed Medicaid care management team and has been advised to call with any health related questions or concerns. No further follow up required

## 2020-06-12 NOTE — Patient Outreach (Signed)
Medicaid Managed Care   Nurse Care Manager Note  06/12/2020 Name:  Tanya Owens MRN:  [id number] DOB:  01/23/1961  Tanya Owens is an 60 y.o. year old female who is a primary patient of Tanya Del, DO.  [facility] Managed Care Coordination team was consulted for assistance with:    eczema  [name] was given information about Medicaid Managed Care Coordination team services today. Tanya Owens agreed to services and verbal consent obtained.  Engaged with patient by telephone for follow up visit in response to provider referral for case management and/or care coordination services.   Assessments/Interventions:  Review of past medical history, allergies, medications, health status, including review of consultants reports, laboratory and other test data, was performed as part of comprehensive evaluation and provision of chronic care management services.  SDOH (Social Determinants of Health) assessments and interventions performed:   Care Plan  Allergies  Allergen Reactions  . Tylenol [Acetaminophen] Other (See Comments)    Stomach cramps     Medications Reviewed Today    Reviewed by Melissa Montane, RN (Registered Nurse) on 06/12/20 at 1438  Med List Status: <None>  Medication Order Taking? Sig Documenting Provider Last Dose Status Informant  amLODipine (NORVASC) 10 MG tablet [id number] Yes Take 1 tablet (10 mg total) by mouth daily. Tanya Del, DO Taking Active   atorvastatin (LIPITOR) 40 MG tablet [id number] Yes take 1 tablet by mouth once daily Tanya Del, DO Taking Active   cephALEXin (KEFLEX) 500 MG capsule [id number] Yes Take 1 capsule (500 mg total) by mouth 2 (two) times daily for 7 days. [name], [place] C, PA-C Taking Active   clobetasol cream (TEMOVATE) 0.05 % [id number] No Apply 1 application topically 2 (two) times daily.  Patient not taking: Reported on 06/12/2020   Tanya Del, DO Not Taking Active   Etanercept 50 MG/ML SOCT [id number] Yes Inject  into the skin. [provider] Taking Active   hydrochlorothiazide (HYDRODIURIL) 25 MG tablet [id number] Yes Take 1 tablet (25 mg total) by mouth daily. Tanya Del, DO Taking Active   hydrOXYzine (ATARAX/VISTARIL) 10 MG tablet [id number] Yes Take 1 tablet (10 mg total) by mouth 3 (three) times daily as needed. Tanya Del, DO Taking Active   lisinopril (ZESTRIL) 40 MG tablet [id number] Yes Take 1 tablet (40 mg total) by mouth daily. Tanya Del, DO Taking Active   metFORMIN (GLUCOPHAGE) 1000 MG tablet [id number] Yes TAKE 1 TABLET(1000 MG) BY MOUTH TWICE DAILY WITH A MEAL Tanya, [name], DO Taking Active   methotrexate (RHEUMATREX) 5 MG tablet [id number] Yes Take 6 tablets (30 mg total) by mouth once a week. Caution: Chemotherapy. Protect from light. Tanya Lope, DO Taking Active   oxyCODONE (OXY IR/ROXICODONE) 5 MG immediate release tablet [id number] No Take 1 tablet (5 mg total) by mouth 3 (three) times daily as needed for moderate pain.  Patient not taking: No sig reported   Tanya Hugger, NP Not Taking Active   phenazopyridine (PYRIDIUM) 200 MG tablet [id number] Yes Take 1 tablet (200 mg total) by mouth 3 (three) times daily. [name], [name], PA-C Taking Active   RESTASIS 0.05 % ophthalmic emulsion [id number] No Place 1 drop into both eyes 2 (two) times daily. Both eyes  Patient not taking: No sig reported   Tanya Lope, DO Not Taking Active   triamcinolone ointment (KENALOG) 0.5 % [id number] Yes Apply 1 application topically 2 (two) times daily. Tanya Key, DO Taking Active  Patient Active Problem List   Diagnosis Date Noted  . Pruritic rash 01/15/2020  . Bilateral chronic knee pain 09/28/2019  . Other chronic pain 08/14/2019  . COVID-19 05/30/2019  . Seronegative arthropathy of multiple sites ([place]) 07/30/2017  . Encounter for medication management 04/19/2017  . Acromioclavicular joint arthritis 03/26/2017  . Frequent No-show for appointment 12/15/2016  .  Cyclic citrullinated peptide (CCP) antibody positive 10/28/2016  . Obesity (BMI 30-39.9) 11/28/2014  . Type 2 diabetes mellitus without complication ([place]) 44/81/8563  . HTN (hypertension) 06/21/2013  . Mixed hyperlipidemia 06/21/2013    Conditions to be addressed/monitored:  eczema  Care Plan : Managing Eczema  Updates made by Melissa Montane, RN since 06/12/2020 12:00 AM    Problem: Eczema Flare-up     Goal: Effectively Manage Eczema   Start Date: 05/08/2020  Expected End Date: 06/12/2020  This Visit's Progress: On track  Recent Progress: On track  Priority: High  Note:   Current Barriers:  . Chronic Disease Management support and education needs related to managing eczema  Nurse Case Manager Clinical Goal(s):  Marland Kitchen Over the next 30 days, patient will meet with RN Care Manager to address eczema management-Update-Patient reports doing "ok" the eczema comes and goes, the prescribed cream helps . Over the next 30 days, patient will attend all scheduled medical appointments-Met  Interventions:  . Inter-disciplinary care team collaboration (see longitudinal plan of care) . Evaluation of current treatment plan related to eczema and patient's adherence to plan as established by provider. . Advised patient to use prescribed medicated cream only on affected area, take a warm shower/bath(not hot), cool compress when feeling itchy, take prescribed hydoxyzine to help with itching, apply Vaseline to skin after bathing . Reviewed medications with patient and discussed how to apply cream for eczema . Discussed plans with patient for ongoing care management follow up and provided patient with direct contact information for care management team . Explained to patient, to call pharmacy to request refill for cream  Patient Goals/Self-Care Activities Over the next 30 days, patient will:  -Patient verbalizes understanding of plan to manage eczema flares  -Self administers medications as prescribed   -Attends all scheduled provider appointments  -Calls pharmacy for medication refills  -Calls provider office for new concerns or questions  Follow Up Plan: The patient has been provided with contact information for the Managed Medicaid care management team and has been advised to call with any health related questions or concerns. No further follow up required        Follow Up:  Patient agrees to Care Plan and Follow-up.  Plan: The patient has been provided with contact information for the Managed Medicaid care management team and has been advised to call with any health related questions or concerns.  Date/time of next scheduled RN care management/care coordination outreach:  No follow up at this time  Lurena Joiner RN, [place] RN Care Coordinator

## 2020-06-18 DIAGNOSIS — Z419 Encounter for procedure for purposes other than remedying health state, unspecified: Secondary | ICD-10-CM | POA: Diagnosis not present

## 2020-07-09 DIAGNOSIS — Z79899 Other long term (current) drug therapy: Secondary | ICD-10-CM | POA: Diagnosis not present

## 2020-07-09 DIAGNOSIS — M0579 Rheumatoid arthritis with rheumatoid factor of multiple sites without organ or systems involvement: Secondary | ICD-10-CM | POA: Diagnosis not present

## 2020-07-16 DIAGNOSIS — Z419 Encounter for procedure for purposes other than remedying health state, unspecified: Secondary | ICD-10-CM | POA: Diagnosis not present

## 2020-08-12 ENCOUNTER — Ambulatory Visit (INDEPENDENT_AMBULATORY_CARE_PROVIDER_SITE_OTHER): Payer: Medicaid Other | Admitting: Family Medicine

## 2020-08-12 ENCOUNTER — Other Ambulatory Visit: Payer: Self-pay

## 2020-08-12 ENCOUNTER — Encounter: Payer: Self-pay | Admitting: Family Medicine

## 2020-08-12 VITALS — BP 108/76 | HR 82 | Ht 69.0 in | Wt 235.8 lb

## 2020-08-12 DIAGNOSIS — L304 Erythema intertrigo: Secondary | ICD-10-CM | POA: Diagnosis not present

## 2020-08-12 DIAGNOSIS — L282 Other prurigo: Secondary | ICD-10-CM

## 2020-08-12 DIAGNOSIS — L309 Dermatitis, unspecified: Secondary | ICD-10-CM

## 2020-08-12 MED ORDER — TRIAMCINOLONE ACETONIDE 0.5 % EX OINT: 1.0000 "application " | TOPICAL_OINTMENT | Freq: Two times a day (BID) | CUTANEOUS | 0 refills | Status: DC

## 2020-08-12 MED ORDER — KETOCONAZOLE 2 % EX CREA: 1.0000 "application " | TOPICAL_CREAM | Freq: Two times a day (BID) | CUTANEOUS | 0 refills | Status: DC

## 2020-08-12 NOTE — Assessment & Plan Note (Signed)
Assessment: 61 year old female presenting with rash under bilateral breast and bikini region.  Been going on for several weeks.  She has previously used a steroid cream, triamcinolone with some mild relief.  However the rash quickly came back.  She has been using some cornstarch to help keep it dry if these regions often hold some moisture.  This rash is quite pruritic.  On physical exam it is mildly erythematous without drainage.  Overall differential can include eczema as the patient does have a history of biopsy-proven eczema on her lower leg, however with the region of this rash I suspect intertrigo as a likely cause. Plan: -We will send in ketoconazole cream -We will refill patient's triamcinolone -Patient will use both the ketoconazole and triamcinolone to see if it relieves the rash, if it does she will discontinue the triamcinolone and continued ketoconazole a bit longer to see if the rash resumes. -If the rash does not improve in the next 2 weeks she will follow-up -Patient will follow up in about a month for A1c.

## 2020-08-12 NOTE — Patient Instructions (Signed)
It was great to see you! Thank you for allowing me to participate in your care!  I recommend that you always bring your medications to each appointment as this makes it easy to ensure we are on the correct medications and helps [place] not miss when refills are needed.  Our plans for today:  -I believe the rash under your breast and in your bikini region is intertrigo.  We are going to treat this with a steroid and a antifungal cream called ketoconazole.  About the use ketoconazole twice per day.  You can use the steroid cream once per day I recommend spacing out a few hours from acute consequence of both resolved well.  I would like for you do not use the steroid cream more than 7 to 10 days in a row. -If you do not get benefit from this in the next 2 weeks I would like you make a follow-up appointment, if this removes the rash then you can stop the steroid cream and see if the rash stays away. -I would like to see you back in about 1 month to make sure you are doing well and we can check your A1c at that time.  Take care and seek immediate care sooner if you develop any concerns.   Dr. Jackelyn Poling, DO [facility]

## 2020-08-12 NOTE — Assessment & Plan Note (Signed)
Rash on the right inner thigh and lower leg is improved.  This was biopsy-proven eczema.  Will refill patient's triamcinolone to use as needed only dose but for her to also use on the rash that is under her chest and in her bikini region which is likely intertrigo, for more information see under patient's problem "intertrigo". Plan: -Refilled patient's triamcinolone -Follow-up as needed, patient understands that she should only use this triamcinolone as needed to avoid skin damage from prolonged steroid usage.

## 2020-08-12 NOTE — Progress Notes (Signed)
    SUBJECTIVE:   CHIEF COMPLAINT / HPI:   Rash on leg: Patient is a 60 year old female that presents today to discuss a rash on her leg. She was previously evaluated with biopsy evidence of eczema and has been using clobetasol cream.  She was last evaluated for this in November and was noted to only using the clobetasol cream twice per week rather than twice per day.  Today she states she has been using triamcinolone and did get some benefit from it but that the rash quickly came back.  It is present on her leg, under her chest, and in her bikini region.  Rash under breast and bikini region: This is been going on for several weeks.  She previously used the steroid cream and got some benefit but it quickly came back.  She has been using cornstarch to help dry it up.  This is only present in the regions of her bikini and under her breast where extra moisture can be present.  She says it is quite pruritic.  PERTINENT  PMH / PSH: None relevant  OBJECTIVE:   BP 108/76   Pulse 82   Ht 5\' 9"  (1.753 m)   Wt 235 lb 12.8 oz (107 kg)   SpO2 98%   BMI 34.82 kg/m    General: NAD, pleasant, able to participate in exam Respiratory: No respiratory distress Skin: Eczematous rash on the right inner thigh and lower leg.  Patient with similar rash present under the breast bilaterally and in the bikini region which is mildly erythematous without drainage. Neuro: alert, no obvious focal deficits Psych: Normal affect and mood  ASSESSMENT/PLAN:   Intertrigo Assessment: 60 year old female presenting with rash under bilateral breast and bikini region.  Been going on for several weeks.  She has previously used a steroid cream, triamcinolone with some mild relief.  However the rash quickly came back.  She has been using some cornstarch to help keep it dry if these regions often hold some moisture.  This rash is quite pruritic.  On physical exam it is mildly erythematous without drainage.  Overall differential  can include eczema as the patient does have a history of biopsy-proven eczema on her lower leg, however with the region of this rash I suspect intertrigo as a likely cause. Plan: -We will send in ketoconazole cream -We will refill patient's triamcinolone -Patient will use both the ketoconazole and triamcinolone to see if it relieves the rash, if it does she will discontinue the triamcinolone and continued ketoconazole a bit longer to see if the rash resumes. -If the rash does not improve in the next 2 weeks she will follow-up -Patient will follow up in about a month for A1c.  Pruritic rash Rash on the right inner thigh and lower leg is improved.  This was biopsy-proven eczema.  Will refill patient's triamcinolone to use as needed only dose but for her to also use on the rash that is under her chest and in her bikini region which is likely intertrigo, for more information see under patient's problem "intertrigo". Plan: -Refilled patient's triamcinolone -Follow-up as needed, patient understands that she should only use this triamcinolone as needed to avoid skin damage from prolonged steroid usage.     75, DO [facility]    This note was prepared using Dragon voice recognition software and may include unintentional dictation errors due to the inherent limitations of voice recognition software.

## 2020-08-16 DIAGNOSIS — Z419 Encounter for procedure for purposes other than remedying health state, unspecified: Secondary | ICD-10-CM | POA: Diagnosis not present

## 2020-09-13 ENCOUNTER — Other Ambulatory Visit: Payer: Self-pay | Admitting: Family Medicine

## 2020-09-13 DIAGNOSIS — I10 Essential (primary) hypertension: Secondary | ICD-10-CM

## 2020-09-13 DIAGNOSIS — E119 Type 2 diabetes mellitus without complications: Secondary | ICD-10-CM

## 2020-09-15 DIAGNOSIS — Z419 Encounter for procedure for purposes other than remedying health state, unspecified: Secondary | ICD-10-CM | POA: Diagnosis not present

## 2020-09-17 ENCOUNTER — Other Ambulatory Visit: Payer: Self-pay

## 2020-10-01 ENCOUNTER — Other Ambulatory Visit: Payer: Self-pay | Admitting: Family Medicine

## 2020-10-16 DIAGNOSIS — Z419 Encounter for procedure for purposes other than remedying health state, unspecified: Secondary | ICD-10-CM | POA: Diagnosis not present

## 2020-10-29 DIAGNOSIS — M0579 Rheumatoid arthritis with rheumatoid factor of multiple sites without organ or systems involvement: Secondary | ICD-10-CM | POA: Diagnosis not present

## 2020-10-29 DIAGNOSIS — Z79899 Other long term (current) drug therapy: Secondary | ICD-10-CM | POA: Diagnosis not present

## 2020-11-15 DIAGNOSIS — Z419 Encounter for procedure for purposes other than remedying health state, unspecified: Secondary | ICD-10-CM | POA: Diagnosis not present

## 2020-12-11 ENCOUNTER — Other Ambulatory Visit: Payer: Self-pay | Admitting: Family Medicine

## 2020-12-11 DIAGNOSIS — E119 Type 2 diabetes mellitus without complications: Secondary | ICD-10-CM

## 2020-12-16 DIAGNOSIS — Z419 Encounter for procedure for purposes other than remedying health state, unspecified: Secondary | ICD-10-CM | POA: Diagnosis not present

## 2021-01-03 ENCOUNTER — Ambulatory Visit (INDEPENDENT_AMBULATORY_CARE_PROVIDER_SITE_OTHER): Payer: Medicaid Other | Admitting: Family Medicine

## 2021-01-03 ENCOUNTER — Other Ambulatory Visit: Payer: Self-pay

## 2021-01-03 ENCOUNTER — Encounter: Payer: Self-pay | Admitting: Family Medicine

## 2021-01-03 VITALS — BP 144/82 | HR 77 | Ht 69.0 in | Wt 247.0 lb

## 2021-01-03 DIAGNOSIS — M2142 Flat foot [pes planus] (acquired), left foot: Secondary | ICD-10-CM

## 2021-01-03 DIAGNOSIS — E782 Mixed hyperlipidemia: Secondary | ICD-10-CM | POA: Diagnosis not present

## 2021-01-03 DIAGNOSIS — B86 Scabies: Secondary | ICD-10-CM | POA: Diagnosis not present

## 2021-01-03 DIAGNOSIS — M0609 Rheumatoid arthritis without rheumatoid factor, multiple sites: Secondary | ICD-10-CM | POA: Diagnosis not present

## 2021-01-03 DIAGNOSIS — L299 Pruritus, unspecified: Secondary | ICD-10-CM | POA: Diagnosis not present

## 2021-01-03 DIAGNOSIS — M2141 Flat foot [pes planus] (acquired), right foot: Secondary | ICD-10-CM | POA: Diagnosis not present

## 2021-01-03 DIAGNOSIS — E119 Type 2 diabetes mellitus without complications: Secondary | ICD-10-CM

## 2021-01-03 LAB — POCT GLYCOSYLATED HEMOGLOBIN (HGB A1C): HbA1c, POC (controlled diabetic range): 6.5 % (ref 0.0–7.0)

## 2021-01-03 MED ORDER — HYDROXYZINE HCL 10 MG PO TABS: 10.0000 mg | ORAL_TABLET | Freq: Three times a day (TID) | ORAL | 0 refills | Status: DC | PRN

## 2021-01-03 MED ORDER — METHOTREXATE SODIUM 5 MG PO TABS: 30.0000 mg | ORAL_TABLET | ORAL | 0 refills | Status: DC

## 2021-01-03 NOTE — Patient Instructions (Signed)
It was great seeing you today!  Stop by the pharmacy to pick up some foot arch support bands while you are waiting to be seen by the foot doctor.  Take 1300 mg (2 -650) tablets of Tylenol arthritis twice a day for pain as needed.   Regarding lab work today:  Due to recent changes in healthcare laws, you may see the results of your imaging and laboratory studies on MyChart before your provider has had a chance to review them.  I understand that in some cases there may be results that are confusing or concerning to you. Not all laboratory results come back in the same time frame and you may be waiting for multiple results in order to interpret others.  Please give [place] 72 hours in order for your provider to thoroughly review all the results before contacting the office for clarification of your results. If everything is normal, you will get a letter in the mail or a message in My Chart. Please give [place] a call if you do not hear from [place] after 2 weeks.  Please bring all of your medications with you to each visit.   Feel free to call with any questions or concerns at any time, at [phone].   Take care,  Dr. Katherina [facility]

## 2021-01-03 NOTE — Progress Notes (Signed)
   SUBJECTIVE:   CHIEF COMPLAINT / HPI:   Chief Complaint  Patient presents with   Foot Pain     Tanya Owens is a 60 y.o. female here for bilateral foot pain and DM follow up.  Patient is flat footed. States pain worse with movement. She is taking pain medication without relief.  Foot pain is worse with standing. Occasionally has burning sensation in her feet. Reports her blood sugars have not been elevated. She takes her diabetes medication as prescribed.      PERTINENT  PMH / PSH: reviewed and updated as appropriate   OBJECTIVE:   BP (!) 144/82   Pulse 77   Ht 5\' 9"  (1.753 m)   Wt 247 lb (112 kg)   SpO2 98%   BMI 36.48 kg/m    GEN: pleasant, in no acute distress  CV: regular rate, well perfused  RESP: no increased work of breathing MSK: no edema,  Bilateral feet: Inspection: No erythema, edema, ecchymosis, pes planus deformity bilaterally, transverse and medial arches flat Palpation: No tenderness of the lateral talocalcaneal joint ROM: Full active and passive range of motion Strength: 5/5 in all directions No ligamentous laxity No pain at the base of the fifth metatarsal Able to ambulate  without pain  -Neurovascularly intact, no instability noted SKIN: warm, dry    ASSESSMENT/PLAN:   Type 2 diabetes mellitus without complication Well controlled. Continue metformin. A1c at goal. Encouraged continued diet rich in vegetables and complex carbs.  Heart healthy carb modified diet. Counseled on need to continue exercising.  - repeat a1c in 6 months    Pes planus OTC pain medication as needed for pain. Pt had improvement with manual manipulation of arches. Arch support bands recommended. Referral to podiatry placed.   Mixed hyperlipidemia Lipid today. Previous LDL 81.      , DO PGY-3, [facility] 01/03/2021

## 2021-01-04 LAB — LIPID PANEL
Chol/HDL Ratio: 3.9 ratio (ref 0.0–4.4)
Cholesterol, Total: 189 mg/dL (ref 100–199)
HDL: 49 mg/dL (ref >39)
LDL Chol Calc (NIH): 118 mg/dL — ABNORMAL HIGH (ref 0–99)
Triglycerides: 121 mg/dL (ref 0–149)
VLDL Cholesterol Cal: 22 mg/dL (ref 5–40)

## 2021-01-06 DIAGNOSIS — M2140 Flat foot [pes planus] (acquired), unspecified foot: Secondary | ICD-10-CM | POA: Insufficient documentation

## 2021-01-06 NOTE — Assessment & Plan Note (Signed)
OTC pain medication as needed for pain. Pt had improvement with manual manipulation of arches. Arch support bands recommended. Referral to podiatry placed.

## 2021-01-06 NOTE — Assessment & Plan Note (Addendum)
Well controlled. Continue metformin. A1c at goal. Encouraged continued diet rich in vegetables and complex carbs.  Heart healthy carb modified diet. Counseled on need to continue exercising.  - repeat a1c in 6 months

## 2021-01-06 NOTE — Assessment & Plan Note (Signed)
Lipid today. Previous LDL 81.

## 2021-01-13 ENCOUNTER — Ambulatory Visit (INDEPENDENT_AMBULATORY_CARE_PROVIDER_SITE_OTHER): Payer: Medicaid Other | Admitting: Family Medicine

## 2021-01-13 ENCOUNTER — Other Ambulatory Visit: Payer: Self-pay

## 2021-01-13 ENCOUNTER — Encounter: Payer: Self-pay | Admitting: Family Medicine

## 2021-01-13 DIAGNOSIS — M722 Plantar fascial fibromatosis: Secondary | ICD-10-CM

## 2021-01-13 DIAGNOSIS — M0609 Rheumatoid arthritis without rheumatoid factor, multiple sites: Secondary | ICD-10-CM

## 2021-01-13 DIAGNOSIS — E1140 Type 2 diabetes mellitus with diabetic neuropathy, unspecified: Secondary | ICD-10-CM

## 2021-01-13 MED ORDER — GABAPENTIN 100 MG PO CAPS: 100.0000 mg | ORAL_CAPSULE | Freq: Three times a day (TID) | ORAL | 3 refills | Status: DC

## 2021-01-13 NOTE — Patient Instructions (Signed)
I think you have several reasons for the foot pain. I think you have some diabetic neuropathy (nerve damage).  I am starting you on gabapentin to help with that pain. I also think you have plantar fasciitis.  Remember the stretching in the morning (tennis ball or rolling pin).  I also put in a referral to the feet specialists -podiatrists.

## 2021-01-15 ENCOUNTER — Encounter: Payer: Self-pay | Admitting: Family Medicine

## 2021-01-15 NOTE — Progress Notes (Signed)
    SUBJECTIVE:   CHIEF COMPLAINT / HPI:   C/O bilateral foot pain on bottoms of feet.  Also some ankle swelling.  Known DM - recent A1C is 6.5.  Also on embryl for seronegative arthropathy.  Has some baseline foot numbness and ankle discomfort.  This new, severe foot pain is present x 3-4 weeks.   Wt is also up from March.  Feels this is more likely fat weight than fluid weight.  Admits to poor diet and less exercise, especially with the foot pain.   OBJECTIVE:   BP 138/76   Pulse 88   Ht 5\' 9"  (1.753 m)   Wt 247 lb 6.4 oz (112.2 kg)   SpO2 97%   BMI 36.53 kg/m   Good pulses bilaterally,  No callous or skin breakdown.  Exquisite pain on plantar surface, mid arch.  Has both longitudinal and metatarsal arch breakdown and ankles roll in with ambulation.  ASSESSMENT/PLAN:   Plantar fasciitis, bilateral Pain is likely multifactorial.  Likely has some baseline diabetic neuropathy and mechanical foot problems with loss of arch.  Her main acute problem seems to be plantar fasciitis.  Discussed ice and massage.  Refer to podiatry.  Type 2 diabetes mellitus with diabetic neuropathy, unspecified (HCC) Because I suspect neuropathy is playing a role, also started on gabapentin.  Seronegative arthropathy of multiple sites While this may contribute to her foot pain, no change in meds.       , MD [facility]

## 2021-01-15 NOTE — Assessment & Plan Note (Signed)
Because I suspect neuropathy is playing a role, also started on gabapentin.

## 2021-01-15 NOTE — Assessment & Plan Note (Signed)
Pain is likely multifactorial.  Likely has some baseline diabetic neuropathy and mechanical foot problems with loss of arch.  Her main acute problem seems to be plantar fasciitis.  Discussed ice and massage.  Refer to podiatry.

## 2021-01-15 NOTE — Assessment & Plan Note (Signed)
While this may contribute to her foot pain, no change in meds.

## 2021-01-16 DIAGNOSIS — Z419 Encounter for procedure for purposes other than remedying health state, unspecified: Secondary | ICD-10-CM | POA: Diagnosis not present

## 2021-02-05 ENCOUNTER — Ambulatory Visit
Admission: EM | Admit: 2021-02-05 | Discharge: 2021-02-05 | Disposition: A | Discharge status: Home / Self Care | Payer: Medicaid Other | Attending: Physician Assistant | Admitting: Physician Assistant

## 2021-02-05 ENCOUNTER — Encounter: Payer: Self-pay | Admitting: Physician Assistant

## 2021-02-05 ENCOUNTER — Other Ambulatory Visit: Payer: Self-pay

## 2021-02-05 DIAGNOSIS — R601 Generalized edema: Secondary | ICD-10-CM | POA: Diagnosis not present

## 2021-02-05 DIAGNOSIS — I10 Essential (primary) hypertension: Secondary | ICD-10-CM | POA: Diagnosis not present

## 2021-02-05 DIAGNOSIS — E1140 Type 2 diabetes mellitus with diabetic neuropathy, unspecified: Secondary | ICD-10-CM

## 2021-02-05 MED ORDER — HYDROCHLOROTHIAZIDE 25 MG PO TABS: 25.0000 mg | ORAL_TABLET | Freq: Every day | ORAL | 1 refills | Status: DC

## 2021-02-05 MED ORDER — HYDROCHLOROTHIAZIDE 25 MG PO TABS: 37.5000 mg | ORAL_TABLET | Freq: Every day | ORAL | 0 refills | Status: DC

## 2021-02-05 NOTE — ED Provider Notes (Signed)
[facility]    CSN: [id number] Arrival date & time: 02/05/21  6546      History   Chief Complaint Chief Complaint  Patient presents with   Foot Swelling    HPI Tanya Owens is a 61 y.o. female.   Patient here today for evaluation of swelling in both of her lower legs. This has been an ongoing problem but seems to be worsening with time. She states she has seen her PCP for same and is already taking a "fluid pill". She is not sure of the name of this medication. She states she is out of this medication but reports she took her last dose this morning. She reports she has difficulty getting in to see her PCP. She does have an appointment scheduled for 9/28. She does not report any chest pain but states she has been more winded walking up a flight of stairs. She states that swelling in her legs will improve with elevation but swelling returns within minutes of her feet being dependent. She reports she bought a pair of compression stockings but must have purchased the wrong size as she could not get them onto her feet because they were too small.     Past Medical History:  Diagnosis Date   Arthritis 2000   Diabetes mellitus without complication (HCC) 2005   Hypercholesteremia 2005   Hypertension 2005   Sleep apnea 2010   Sleep disorder     Patient Active Problem List   Diagnosis Date Noted   Plantar fasciitis, bilateral 01/13/2021   Pes planus 01/06/2021   Intertrigo 08/12/2020   Pruritic rash 01/15/2020   Bilateral chronic knee pain 09/28/2019   Other chronic pain 08/14/2019   COVID-19 05/30/2019   Seronegative arthropathy of multiple sites (HCC) 07/30/2017   Encounter for medication management 04/19/2017   Acromioclavicular joint arthritis 03/26/2017   Frequent No-show for appointment 12/15/2016   Cyclic citrullinated peptide (CCP) antibody positive 10/28/2016   Obesity (BMI 30-39.9) 11/28/2014   Type 2 diabetes mellitus with diabetic neuropathy,  unspecified (HCC) 11/28/2014   HTN (hypertension) 06/21/2013   Mixed hyperlipidemia 06/21/2013    Past Surgical History:  Procedure Laterality Date   CARDIAC CATHETERIZATION  10/18/2007   Normal coronaries, systemic hypertension    OB History   No obstetric history on file.      Home Medications    Prior to Admission medications   Medication Sig Start Date End Date Taking? Authorizing Provider  hydrochlorothiazide (HYDRODIURIL) 25 MG tablet Take 1.5 tablets (37.5 mg total) by mouth daily for 7 days. 02/05/21 02/12/21 Yes Tomi Bamberger, PA-C  amLODipine (NORVASC) 10 MG tablet TAKE 1 TABLET(10 MG) BY MOUTH DAILY 09/13/20   Jackelyn Poling, DO  atorvastatin (LIPITOR) 40 MG tablet TAKE 1 TABLET BY MOUTH EVERY DAY 09/13/20   Jackelyn Poling, DO  Etanercept 50 MG/ML SOCT Inject into the skin. 09/20/17   [provider]  gabapentin (NEURONTIN) 100 MG capsule Take 1 capsule (100 mg total) by mouth 3 (three) times daily. 01/13/21   Moses Manners, MD  ketoconazole (NIZORAL) 2 % cream APPLY TOPICALLY TO THE AFFECTED AREA TWICE DAILY 10/01/20   Jackelyn Poling, DO  lisinopril (ZESTRIL) 40 MG tablet Take 1 tablet (40 mg total) by mouth daily. 03/22/20   Jackelyn Poling, DO  metFORMIN (GLUCOPHAGE) 1000 MG tablet TAKE 1 TABLET(1000 MG) BY MOUTH TWICE DAILY WITH A MEAL 12/11/20   Jackelyn Poling, DO  oxyCODONE (OXY IR/ROXICODONE) 5 MG immediate release tablet Take  1 tablet (5 mg total) by mouth 3 (three) times daily as needed for moderate pain. Patient not taking: No sig reported 07/31/19   Jones Bales, NP  phenazopyridine (PYRIDIUM) 200 MG tablet Take 1 tablet (200 mg total) by mouth 3 (three) times daily. 06/08/20   [name], Hallie C, PA-C  RESTASIS 0.05 % ophthalmic emulsion Place 1 drop into both eyes 2 (two) times daily. Both eyes Patient not taking: No sig reported 04/20/18   Leland Her, DO  triamcinolone ointment (KENALOG) 0.5 % APPLY TOPICALLY TO THE AFFECTED AREA TWICE DAILY 10/01/20    Jackelyn Poling, DO    Family History Family History  Problem Relation Age of Onset   Heart attack Mother    Diabetes Mother    Hypercholesterolemia Mother    Hypertension Mother    Diabetes Sister    Hypertension Brother     Social History Social History   Tobacco Use   Smoking status: Former    Types: Cigarettes    Quit date: 12/27/2012    Years since quitting: 8.1   Smokeless tobacco: Never  Vaping Use   Vaping Use: Never used  Substance Use Topics   Alcohol use: No   Drug use: No     Allergies   Tylenol [acetaminophen]   Review of Systems Review of Systems  Constitutional:  Negative for chills and fever.  Eyes:  Negative for discharge and redness.  Respiratory:  Positive for shortness of breath. Negative for wheezing.   Musculoskeletal:  Negative for joint swelling.    Physical Exam Triage Vital Signs ED Triage Vitals [02/05/21 0954]  Enc Vitals Group     BP (!) 157/91     Pulse Rate 78     Resp 18     Temp 98.5 F (36.9 C)     Temp Source Oral     SpO2 97 %     Weight      Height      Head Circumference      Peak Flow      Pain Score      Pain Loc      Pain Edu?      Excl. in GC?    No data found.  Updated Vital Signs BP (!) 157/91 (BP Location: Left Arm)   Pulse 78   Temp 98.5 F (36.9 C) (Oral)   Resp 18   SpO2 97%   Physical Exam Vitals and nursing note reviewed.  Constitutional:      General: She is not in acute distress.    Appearance: Normal appearance. She is not ill-appearing.  HENT:     Head: Normocephalic and atraumatic.  Eyes:     Conjunctiva/sclera: Conjunctivae normal.  Cardiovascular:     Rate and Rhythm: Normal rate and regular rhythm.     Heart sounds: Normal heart sounds. No murmur heard. Pulmonary:     Effort: Pulmonary effort is normal. No respiratory distress.     Breath sounds: Normal breath sounds. No wheezing, rhonchi or rales.  Musculoskeletal:     Right lower leg: 3+ Edema present.     Left lower leg:  2+ Edema present.  Skin:    General: Skin is warm and dry.  Neurological:     Mental Status: She is alert.  Psychiatric:        Mood and Affect: Mood normal.        Behavior: Behavior normal.        Thought Content: Thought content  normal.     UC Treatments / Results  Labs (all labs ordered are listed, but only abnormal results are displayed) Labs Reviewed - No data to display  EKG   Radiology No results found.  Procedures Procedures (including critical care time)  Medications Ordered in UC Medications - No data to display  Initial Impression / Assessment and Plan / UC Course  I have reviewed the triage vital signs and the nursing notes.  Pertinent labs & imaging results that were available during my care of the patient were reviewed by me and considered in my medical decision making (see chart for details).   Patient is a known diabetic who reports that her edema is related to her diabetes per her PCP. Will trial increased dose of HCTZ as her blood pressure is a little elevated today as well. Recommended follow up with her PCP in one week as scheduled or sooner with any side effects from medication or further concerns as I suspect she will need further workup to address edema, specifically if it persists without improvement. I did encourage patient to purchase compression stockings and wear daily as well.   Final Clinical Impressions(s) / UC Diagnoses   Final diagnoses:  Type 2 diabetes mellitus with diabetic neuropathy, without long-term current use of insulin (HCC)  Primary hypertension  Generalized edema  Essential hypertension     Discharge Instructions      Try increased dose of hydrochlorothiazide. Start wearing compression stockings daily. Elevate feet whenever possible. Recommend follow up with PCP as scheduled 9/28 or sooner with any further concerns.      ED Prescriptions     Medication Sig Dispense Auth. Provider   hydrochlorothiazide (HYDRODIURIL)  25 MG tablet Take 1.5 tablets (37.5 mg total) by mouth daily for 7 days. 11 tablet Erma Pinto F, PA-C   hydrochlorothiazide (HYDRODIURIL) 25 MG tablet  (Status: Discontinued) Take 1 tablet (25 mg total) by mouth daily. 90 tablet Tomi Bamberger, PA-C      PDMP not reviewed this encounter.   Tomi Bamberger, PA-C 02/05/21 1022

## 2021-02-05 NOTE — Discharge Instructions (Addendum)
Try increased dose of hydrochlorothiazide. Start wearing compression stockings daily. Elevate feet whenever possible. Recommend follow up with PCP as scheduled 9/28 or sooner with any further concerns.

## 2021-02-05 NOTE — ED Triage Notes (Signed)
Progressive bilateral ankle swelling starting in august and worsening until now. Says she takes "fluid pills." Denies any ankle injuries

## 2021-02-12 ENCOUNTER — Ambulatory Visit (INDEPENDENT_AMBULATORY_CARE_PROVIDER_SITE_OTHER): Payer: Medicaid Other | Admitting: Family Medicine

## 2021-02-12 ENCOUNTER — Other Ambulatory Visit: Payer: Self-pay

## 2021-02-12 VITALS — BP 148/86 | HR 80 | Ht 69.0 in | Wt 251.6 lb

## 2021-02-12 DIAGNOSIS — R0600 Dyspnea, unspecified: Secondary | ICD-10-CM | POA: Diagnosis not present

## 2021-02-12 DIAGNOSIS — M2141 Flat foot [pes planus] (acquired), right foot: Secondary | ICD-10-CM

## 2021-02-12 DIAGNOSIS — R0609 Other forms of dyspnea: Secondary | ICD-10-CM

## 2021-02-12 DIAGNOSIS — M2142 Flat foot [pes planus] (acquired), left foot: Secondary | ICD-10-CM | POA: Diagnosis not present

## 2021-02-12 DIAGNOSIS — I10 Essential (primary) hypertension: Secondary | ICD-10-CM

## 2021-02-12 DIAGNOSIS — Z23 Encounter for immunization: Secondary | ICD-10-CM

## 2021-02-12 MED ORDER — HYDROCHLOROTHIAZIDE 25 MG PO TABS: 25.0000 mg | ORAL_TABLET | Freq: Every day | ORAL | 0 refills | Status: DC

## 2021-02-12 NOTE — Progress Notes (Signed)
   SUBJECTIVE:   CHIEF COMPLAINT / HPI:   Chief Complaint  Patient presents with   Foot Swelling     Tanya Owens is a 60 y.o. female here for to discuss foot swelling.  Reports she went to the urgent care and was given some medication and this has helped her foot swelling.  This has been going on for "a while."  States her sister gave her TED hose which is also helped her foot swelling.  She is also have occasional foot pain.  Has an appointment with the podiatrist next month.  She notes increased dyspnea walking upstairs a flight of stairs.  Reports that she does not need to stop but she just has become more short of breath when walking upstairs.  Has been seen by cardiologist in the past but not recently.  Would like to be seen by cardiology again.  PERTINENT  PMH / PSH: reviewed and updated as appropriate   OBJECTIVE:   BP (!) 148/86   Pulse 80   Ht 5\' 9"  (1.753 m)   Wt 251 lb 9.6 oz (114.1 kg)   SpO2 100%   BMI 37.15 kg/m    GEN: pleasant well appearing female, in no acute distress  CV: regular rate and rhythm, no murmurs appreciated, no JVP RESP: no increased work of breathing, clear to ascultation bilaterally, no rales, rhonchi, wheezing MSK: Pes planus bilaterally, minimal bilateral ankle nonpitting edema, no calf tenderness, no venous stasis dermatitis SKIN: warm, dry, no rash on visible skin    ASSESSMENT/PLAN:   Pes planus Has an upcoming appointment with podiatry.  Continue over-the-counter pain medication as needed.  She perceived the outpouching of her ankles related to her pes planus was increased foot swelling.  Discussed pes planus and showed patient pictures today.  She will follow-up with podiatry.  DOE (dyspnea on exertion) Patient had echo in 2015.  Results not available for review.  It appears no history of heart failure.  She was seen by Dr. 2016 several years ago and had a negative stress test.  Referral placed for patient to be be  reevaluated due to increased dyspnea.  On chart review, it appears patient is a smoker.  May be related to underlying COPD.  After cardiology evaluation, recommend PFTs and smoking cessation counseling at follow-up.  HTN (hypertension) Stop the 37.5 mg HCTZ started by the UC. Continue 25 mg HCTZ. Discontinue amlodipine due to concern for LE edema. Continue Lisinopril 40 mg. Obtain BMP today. Follow up in 1-2 weeks.      Nicki Guadalajara, DO PGY-3, [facility] 02/12/2021

## 2021-02-12 NOTE — Patient Instructions (Signed)
It was great seeing you today!  Please check-out at the front desk before leaving the clinic. I'd like to see you back in 2 weeks for a blood pressure check.   Visit Remembers: - Stop by the pharmacy to pick up your prescriptions  - Continue to work on your healthy eating habits and incorporating exercise into your daily life.  - Your goal is to have an BP < 120/80 - Medicine Changes: Stop the increased dose (1 1/2 tablets) of hydrochlorothiazide  - A referral to cardiology was placed be call us in 2 weeks if you have not been contacted about an appointment.  Regarding lab work today:  Due to recent changes in healthcare laws, you may see the results of your imaging and laboratory studies on MyChart before your provider has had a chance to review them.  I understand that in some cases there may be results that are confusing or concerning to you. Not all laboratory results come back in the same time frame and you may be waiting for multiple results in order to interpret others.  Please give [place] 72 hours in order for your provider to thoroughly review all the results before contacting the office for clarification of your results. If everything is normal, you will get a letter in the mail or a message in My Chart. Please give [place] a call if you do not hear from [place] after 2 weeks.  Please bring all of your medications with you to each visit.    If you haven't already, sign up for My Chart to have easy access to your labs results, and communication with your primary care physician.  Feel free to call with any questions or concerns at any time, at [phone].   Take care,  Dr. Katherina Right [facility]

## 2021-02-13 ENCOUNTER — Encounter: Payer: Self-pay | Admitting: Family Medicine

## 2021-02-13 DIAGNOSIS — R0609 Other forms of dyspnea: Secondary | ICD-10-CM | POA: Insufficient documentation

## 2021-02-13 DIAGNOSIS — R0600 Dyspnea, unspecified: Secondary | ICD-10-CM | POA: Insufficient documentation

## 2021-02-13 LAB — BASIC METABOLIC PANEL
BUN/Creatinine Ratio: 11 — ABNORMAL LOW (ref 12–28)
BUN: 8 mg/dL (ref 8–27)
CO2: 23 mmol/L (ref 20–29)
Calcium: 9.7 mg/dL (ref 8.7–10.3)
Chloride: 98 mmol/L (ref 96–106)
Creatinine, Ser: 0.7 mg/dL (ref 0.57–1.00)
Glucose: 110 mg/dL — ABNORMAL HIGH (ref 70–99)
Potassium: 3.8 mmol/L (ref 3.5–5.2)
Sodium: 137 mmol/L (ref 134–144)
eGFR: 98 mL/min/{1.73_m2} (ref >59)

## 2021-02-13 NOTE — Assessment & Plan Note (Signed)
Stop the 37.5 mg HCTZ started by the UC. Continue 25 mg HCTZ. Discontinue amlodipine due to concern for LE edema. Continue Lisinopril 40 mg. Obtain BMP today. Follow up in 1-2 weeks.

## 2021-02-13 NOTE — Assessment & Plan Note (Signed)
Has an upcoming appointment with podiatry.  Continue over-the-counter pain medication as needed.  She perceived the outpouching of her ankles related to her pes planus was increased foot swelling.  Discussed pes planus and showed patient pictures today.  She will follow-up with podiatry.

## 2021-02-13 NOTE — Assessment & Plan Note (Addendum)
Patient had echo in 2015.  Results not available for review.  It appears no history of heart failure.  She was seen by Dr. Nicki Guadalajara several years ago and had a negative stress test.  Referral placed for patient to be be reevaluated due to increased dyspnea.  On chart review, it appears patient is a smoker.  May be related to underlying COPD.  After cardiology evaluation, recommend PFTs and smoking cessation counseling at follow-up.

## 2021-02-15 DIAGNOSIS — Z419 Encounter for procedure for purposes other than remedying health state, unspecified: Secondary | ICD-10-CM | POA: Diagnosis not present

## 2021-03-11 ENCOUNTER — Other Ambulatory Visit: Payer: Self-pay | Admitting: Family Medicine

## 2021-03-11 DIAGNOSIS — E119 Type 2 diabetes mellitus without complications: Secondary | ICD-10-CM

## 2021-03-11 DIAGNOSIS — I10 Essential (primary) hypertension: Secondary | ICD-10-CM

## 2021-03-12 DIAGNOSIS — M0579 Rheumatoid arthritis with rheumatoid factor of multiple sites without organ or systems involvement: Secondary | ICD-10-CM | POA: Diagnosis not present

## 2021-03-12 DIAGNOSIS — M2550 Pain in unspecified joint: Secondary | ICD-10-CM | POA: Diagnosis not present

## 2021-03-12 DIAGNOSIS — Z79899 Other long term (current) drug therapy: Secondary | ICD-10-CM | POA: Diagnosis not present

## 2021-03-18 DIAGNOSIS — Z419 Encounter for procedure for purposes other than remedying health state, unspecified: Secondary | ICD-10-CM | POA: Diagnosis not present

## 2021-03-26 ENCOUNTER — Ambulatory Visit: Payer: Medicaid Other | Admitting: Internal Medicine

## 2021-03-26 NOTE — Progress Notes (Deleted)
Cardiology Office Note:    Date:  03/26/2021   ID:  Tanya Owens, DOB Oct 04, 1960, MRN [id number]  PCP:  Jackelyn Poling, DO   [facility] Providers Cardiologist:  None { Click to update primary MD,subspecialty MD or APP then REFRESH:1}    Referring MD: Westley Chandler, MD   No chief complaint on file. ***  History of Present Illness:    Tanya Owens is a 60 y.o. female with a hx of ***  Past Medical History:  Diagnosis Date   Arthritis 2000   Diabetes mellitus without complication (HCC) 2005   Hypercholesteremia 2005   Hypertension 2005   Sleep apnea 2010   Sleep disorder     Past Surgical History:  Procedure Laterality Date   CARDIAC CATHETERIZATION  10/18/2007   Normal coronaries, systemic hypertension    Current Medications: No outpatient medications have been marked as taking for the 03/26/21 encounter (Appointment) with Maisie Fus, MD.     Allergies:   Tylenol [acetaminophen]   Social History   Socioeconomic History   Marital status: Single    Spouse name: Not on file   Number of children: Not on file   Years of education: Not on file   Highest education level: Not on file  Occupational History   Not on file  Tobacco Use   Smoking status: Former    Types: Cigarettes    Quit date: 12/27/2012    Years since quitting: 8.2   Smokeless tobacco: Never  Vaping Use   Vaping Use: Never used  Substance and Sexual Activity   Alcohol use: No   Drug use: No   Sexual activity: Yes    Birth control/protection: Post-menopausal  Other Topics Concern   Not on file  Social History Narrative   Not on file   Social Determinants of Health   Financial Resource Strain: Not on file  Food Insecurity: Not on file  Transportation Needs: Not on file  Physical Activity: Not on file  Stress: Not on file  Social Connections: Not on file     Family History: The patient's ***family history includes Diabetes in her mother and sister; Heart attack in her  mother; Hypercholesterolemia in her mother; Hypertension in her brother and mother.  ROS:   Please see the history of present illness.    *** All other systems reviewed and are negative.  EKGs/Labs/Other Studies Reviewed:    The following studies were reviewed today: ***  EKG:  EKG is *** ordered today.  The ekg ordered today demonstrates ***  Recent Labs: 02/12/2021: BUN 8; Creatinine, Ser 0.70; Potassium 3.8; Sodium 137  Recent Lipid Panel    Component Value Date/Time   CHOL 189 01/03/2021 1215   TRIG 121 01/03/2021 1215   HDL 49 01/03/2021 1215   CHOLHDL 3.9 01/03/2021 1215   CHOLHDL 6.3 10/18/2007 0539   VLDL 44 (H) 10/18/2007 0539   LDLCALC 118 (H) 01/03/2021 1215   LDLDIRECT 129 (H) 07/02/2017 1424   LDLDIRECT 97 02/25/2016 1434     Risk Assessment/Calculations:   {Does this patient have ATRIAL FIBRILLATION?:[id number]}       Physical Exam:    VS:  There were no vitals taken for this visit.    Wt Readings from Last 3 Encounters:  02/12/21 251 lb 9.6 oz (114.1 kg)  01/13/21 247 lb 6.4 oz (112.2 kg)  01/03/21 247 lb (112 kg)     GEN: *** Well nourished, well developed in no acute distress HEENT: Normal  NECK: No JVD; No carotid bruits LYMPHATICS: No lymphadenopathy CARDIAC: ***RRR, no murmurs, rubs, gallops RESPIRATORY:  Clear to auscultation without rales, wheezing or rhonchi  ABDOMEN: Soft, non-tender, non-distended MUSCULOSKELETAL:  No edema; No deformity  SKIN: Warm and dry NEUROLOGIC:  Alert and oriented x 3 PSYCHIATRIC:  Normal affect   ASSESSMENT:    No diagnosis found. PLAN:    In order of problems listed above:  ***      {Are you ordering a CV Procedure (e.g. stress test, cath, DCCV, TEE, etc)?   Press F2        :[id number]}    Medication Adjustments/Labs and Tests Ordered: Current medicines are reviewed at length with the patient today.  Concerns regarding medicines are outlined above.  No orders of the defined types were placed in  this encounter.  No orders of the defined types were placed in this encounter.   There are no Patient Instructions on file for this visit.   Signed, Maisie Fus, MD  03/26/2021 1:53 PM    [facility]

## 2021-04-15 ENCOUNTER — Ambulatory Visit (INDEPENDENT_AMBULATORY_CARE_PROVIDER_SITE_OTHER): Payer: Medicaid Other | Admitting: Cardiovascular Disease

## 2021-04-15 ENCOUNTER — Other Ambulatory Visit: Payer: Self-pay

## 2021-04-15 VITALS — BP 136/84 | HR 80 | Ht 69.0 in | Wt 251.0 lb

## 2021-04-15 DIAGNOSIS — R0602 Shortness of breath: Secondary | ICD-10-CM | POA: Diagnosis not present

## 2021-04-15 DIAGNOSIS — R29818 Other symptoms and signs involving the nervous system: Secondary | ICD-10-CM

## 2021-04-15 DIAGNOSIS — E7800 Pure hypercholesterolemia, unspecified: Secondary | ICD-10-CM | POA: Diagnosis not present

## 2021-04-15 DIAGNOSIS — E119 Type 2 diabetes mellitus without complications: Secondary | ICD-10-CM

## 2021-04-15 DIAGNOSIS — E785 Hyperlipidemia, unspecified: Secondary | ICD-10-CM

## 2021-04-15 DIAGNOSIS — I10 Essential (primary) hypertension: Secondary | ICD-10-CM

## 2021-04-15 DIAGNOSIS — E1142 Type 2 diabetes mellitus with diabetic polyneuropathy: Secondary | ICD-10-CM

## 2021-04-15 DIAGNOSIS — M0609 Rheumatoid arthritis without rheumatoid factor, multiple sites: Secondary | ICD-10-CM | POA: Diagnosis not present

## 2021-04-15 MED ORDER — ATORVASTATIN CALCIUM 80 MG PO TABS: 80.0000 mg | ORAL_TABLET | Freq: Every day | ORAL | 3 refills | Status: DC

## 2021-04-15 NOTE — Patient Instructions (Signed)
Medication Instructions:  INCREASE the Atorvastatin to 80 mg once daily  *If you need a refill on your cardiac medications before your next appointment, please call your pharmacy*   Lab Work: Your provider would like for you to return in 3 months to have the following labs drawn: fasting Lipid. You do not need an appointment for the lab. Once in our office lobby there is a podium where you can sign in and ring the doorbell to alert [place] that you are here. The lab is open from 8:00 am to 4:30 pm; closed for lunch from 12:45pm-1:45pm.  If you have labs (blood work) drawn today and your tests are completely normal, you will receive your results only by: MyChart Message (if you have MyChart) OR A paper copy in the mail If you have any lab test that is abnormal or we need to change your treatment, we will call you to review the results.   Testing/Procedures: Your physician has requested that you have an echocardiogram. Echocardiography is a painless test that uses sound waves to create images of your heart. It provides your doctor with information about the size and shape of your heart and how well your heart's chambers and valves are working. You may receive an ultrasound enhancing agent through an IV if needed to better visualize your heart during the echo.This procedure takes approximately one hour. There are no restrictions for this procedure. This will take place at the [address], [address].     Follow-Up: At [facility], you and your health needs are our priority.  As part of our continuing mission to provide you with exceptional heart care, we have created designated Provider Care Teams.  These Care Teams include your primary Cardiologist (physician) and Advanced Practice Providers (APPs -  Physician Assistants and Nurse Practitioners) who all work together to provide you with the care you need, when you need it.  We recommend signing up for the patient portal called "MyChart".   Sign up information is provided on this After Visit Summary.  MyChart is used to connect with patients for Virtual Visits (Telemedicine).  Patients are able to view lab/test results, encounter notes, upcoming appointments, etc.  Non-urgent messages can be sent to your provider as well.   To learn more about what you can do with MyChart, go to [email or web address].    Your next appointment:   4 month(s)  The format for your next appointment:   In Person  Provider:   Thurmon Fair, MD

## 2021-04-15 NOTE — Progress Notes (Signed)
Cardiology Office Note:    Date:  04/20/2021   ID:  KADE RICKELS, DOB [birth year], MRN [id number]  PCP:  Jackelyn Poling, DO   [facility] Providers Cardiologist:  Thurmon Fair, MD     Referring MD: Westley Chandler, MD   Chief Complaint  Patient presents with   Edema   Shortness of Breath       Tanya Owens is a 60 y.o. female who is being seen today for the evaluation of edema and shortness of breath at the request of Westley Chandler, MD.   History of Present Illness:    Tanya Owens is a 60 y.o. 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  She becomes short of breath climbing 1 flight of stairs, but not with self-care activities (NYHA functional class II).  She complains of swelling of the lower extremities that is consistently worse at the end of the day and resolves after overnight supine position.  The swelling has been present now for about 1 month.  She has gained about 15 pounds in a few months.  She denies palpitations, dizziness, syncope, orthopnea, PND, wheezing, cough, hemoptysis, claudication, focal neurological complaints other than neuropathic symptoms in the extremities.  She believes that she snores, but she does not have a lot of daytime hypersomnolence.  She had a cardiac catheterization in 2009 with normal coronary arteries.  She underwent a pharmacological nuclear stress test in 2016 with normal perfusion pattern.  She underwent an echocardiogram in 2015 with a normal left ventricular systolic and diastolic function but with mild left atrial dilation and mild mitral insufficiency.  She is not sure when she went through menopause since she had an endometrial ablation and never really had hot flashes.  She is not an active smoker,  quit 9 years ago.  Glycemic control is good with a hemoglobin A1c of 6.5% a few months ago (metformin monotherapy).  A few months ago she was started on atorvastatin for hypercholesterolemia (LDL 118, HDL 49) and has not yet had repeat lipid profile.  She believes her mother had a myocardial infarction, but not at an early age.  Past Medical History:  Diagnosis Date   Arthritis 2000   Diabetes mellitus without complication (HCC) 2005   Hypercholesteremia 2005   Hypertension 2005   Sleep apnea 2010   Sleep disorder     Past Surgical History:  Procedure Laterality Date   CARDIAC CATHETERIZATION  10/18/2007   Normal coronaries, systemic hypertension    Current Medications: Current Meds  Medication Sig   amLODipine (NORVASC) 10 MG tablet TAKE 1 TABLET(10 MG) BY MOUTH DAILY   Etanercept 50 MG/ML SOCT Inject into the skin.   gabapentin (NEURONTIN) 100 MG capsule Take 1 capsule (100 mg total) by mouth 3 (three) times daily.   hydrochlorothiazide (HYDRODIURIL) 25 MG tablet Take 1 tablet (25 mg total) by mouth daily.   ketoconazole (NIZORAL) 2 % cream APPLY TOPICALLY TO THE AFFECTED AREA TWICE DAILY   lisinopril (ZESTRIL) 40 MG tablet Take 1 tablet (40 mg total) by mouth daily.   metFORMIN (GLUCOPHAGE) 1000 MG tablet TAKE 1 TABLET(1000 MG) BY MOUTH TWICE DAILY WITH A MEAL   phenazopyridine (PYRIDIUM) 200 MG tablet Take 1 tablet (200 mg total) by mouth 3 (three) times daily.   triamcinolone ointment (KENALOG) 0.5 % APPLY TOPICALLY TO  THE AFFECTED AREA TWICE DAILY   [DISCONTINUED] atorvastatin (LIPITOR) 40 MG tablet TAKE 1 TABLET BY MOUTH EVERY DAY     Allergies:   Tylenol [acetaminophen]   Social History   Socioeconomic History   Marital status: Single    Spouse name: Not on file   Number of children: Not on file   Years of education: Not on file   Highest education level: Not on file  Occupational History   Not on file  Tobacco Use   Smoking status: Former    Types: Cigarettes     Quit date: 12/27/2012    Years since quitting: 8.3   Smokeless tobacco: Never  Vaping Use   Vaping Use: Never used  Substance and Sexual Activity   Alcohol use: No   Drug use: No   Sexual activity: Yes    Birth control/protection: Post-menopausal  Other Topics Concern   Not on file  Social History Narrative   Not on file   Social Determinants of Health   Financial Resource Strain: Not on file  Food Insecurity: Not on file  Transportation Needs: Not on file  Physical Activity: Not on file  Stress: Not on file  Social Connections: Not on file     Family History: The patient's family history includes Diabetes in her mother and sister; Heart attack in her mother; Hypercholesterolemia in her mother; Hypertension in her brother and mother.  ROS:   Please see the history of present illness.     All other systems reviewed and are negative.  EKGs/Labs/Other Studies Reviewed:    The following studies were reviewed today: Echocardiogram 2015 - Left ventricle: The cavity size was normal. Systolic    function was normal. The estimated ejection fraction was    in the range of 60% to 65%. Wall motion was normal; there    were no regional wall motion abnormalities. Left    ventricular diastolic function parameters were normal.  - Mitral valve: Mild regurgitation.  - Left atrium: The atrium was mildly dilated.  Lexiscan Myoview 2016 Normal stress nuclear study. LV Wall Motion:  NL LV Function; NL Wall Motion; LVEF 53%.  EKG:  EKG is ordered today.  The ekg ordered today demonstrates normal sinus rhythm, poor R wave progression across the anterior precordium with slight ST segment depression and T wave inversion in leads V5 and V6.  The ECG is very similar to a tracing from 2016.  Recent Labs: 02/12/2021: BUN 8; Creatinine, Ser 0.70; Potassium 3.8; Sodium 137  Recent Lipid Panel    Component Value Date/Time   CHOL 189 01/03/2021 1215   TRIG 121 01/03/2021 1215   HDL 49 01/03/2021  1215   CHOLHDL 3.9 01/03/2021 1215   CHOLHDL 6.3 10/18/2007 0539   VLDL 44 (H) 10/18/2007 0539   LDLCALC 118 (H) 01/03/2021 1215   LDLDIRECT 129 (H) 07/02/2017 1424   LDLDIRECT 97 02/25/2016 1434     Risk Assessment/Calculations:           Physical Exam:    VS:  BP 136/84   Pulse 80   Ht 5\' 9"  (1.753 m)   Wt 251 lb (113.9 kg)   SpO2 99%   BMI 37.07 kg/m     Wt Readings from Last 3 Encounters:  04/15/21 251 lb (113.9 kg)  02/12/21 251 lb 9.6 oz (114.1 kg)  01/13/21 247 lb 6.4 oz (112.2 kg)     GEN: Obese, well nourished, well developed in no acute distress HEENT: Normal NECK: No  JVD; No carotid bruits LYMPHATICS: No lymphadenopathy CARDIAC: RRR, no murmurs, rubs, gallops RESPIRATORY:  Clear to auscultation without rales, wheezing or rhonchi  ABDOMEN: Soft, non-tender, non-distended MUSCULOSKELETAL:  No edema; No deformity  SKIN: Warm and dry NEUROLOGIC:  Alert and oriented x 3 PSYCHIATRIC:  Normal affect   ASSESSMENT:    1. Shortness of breath   2. Severe obesity (BMI 35.0-39.9) with comorbidity (HCC)   3. Essential hypertension   4. Type 2 diabetes mellitus with diabetic polyneuropathy, without long-term current use of insulin (HCC)   5. Pure hypercholesterolemia   6. Suspected sleep apnea   7. Rheumatoid arthritis, seronegative, multiple sites (HCC)    PLAN:    In order of problems listed above:  Exertional dyspnea: Symptoms are suspicious for heart failure (presumably diastolic, but need to repeat echocardiographic assessment).  She does not have angina.  She has a history of normal coronary arteries by angiography, but this was 13 years ago.  She also had a normal nuclear stress test just 6 years ago.  If the echo shows decreased LV function or regional wall motion abnormalities, may consider proceeding with either invasive or CT coronary angiography.  Alternatively, her symptoms may be due to untreated obstructive sleep apnea, pulmonary hypertension and  right heart failure. Severe obesity: At risk for developing sleep apnea and a cause of her multiple metabolic abnormalities including diabetes mellitus.  Likely could "cure" her diabetes by losing substantial weight. HTN: Borderline blood pressure control.  Ideally would get her blood pressure less than 130/80.  She is already on a maximum dose of lisinopril and amlodipine, in addition to a moderate dose of thiazide diuretic.  Reinforced the need for sodium restriction.  Consider adding carvedilol, especially if LVEF is low.  Alternatively, could add spironolactone, especially if we have to add a loop diuretic for treatment of heart failure.. DM: If heart failure is confirmed, would benefit from adding [place] or [place]ard[place].  Currently glycemic control is good.  She has neuropathy, but no evidence of chronic kidney disease. HLP: Target LDL cholesterol less than 244, prefer less than 70.  On the current dose of atorvastatin it is quite likely she will achieve even that more stringent goal.  Recheck lipid profile since she has been on the atorvastatin now for over 2 months. Suspected OSA: I do not think she ever underwent the planned sleep study years ago. RA: Important to avoid nonsteroidal anti-inflammatory drugs since these would worsen edema and in danger kidney function.  Depending on findings on echocardiography, may need to discuss finding an alternative to TNF alpha inhibition therapy with her rheumatologist.  At this point, she does not appear to have severe or acute heart failure.           Medication Adjustments/Labs and Tests Ordered: Current medicines are reviewed at length with the patient today.  Concerns regarding medicines are outlined above.  Orders Placed This Encounter  Procedures   Lipid panel   EKG 12-Lead   ECHOCARDIOGRAM COMPLETE   Meds ordered this encounter  Medications   atorvastatin (LIPITOR) 80 MG tablet    Sig: Take 1 tablet (80 mg total) by mouth daily.     Dispense:  90 tablet    Refill:  3    Patient Instructions  Medication Instructions:  INCREASE the Atorvastatin to 80 mg once daily  *If you need a refill on your cardiac medications before your next appointment, please call your pharmacy*   Lab Work: Your provider would like  for you to return in 3 months to have the following labs drawn: fasting Lipid. You do not need an appointment for the lab. Once in our office lobby there is a podium where you can sign in and ring the doorbell to alert [place] that you are here. The lab is open from 8:00 am to 4:30 pm; closed for lunch from 12:45pm-1:45pm.  If you have labs (blood work) drawn today and your tests are completely normal, you will receive your results only by: MyChart Message (if you have MyChart) OR A paper copy in the mail If you have any lab test that is abnormal or we need to change your treatment, we will call you to review the results.   Testing/Procedures: Your physician has requested that you have an echocardiogram. Echocardiography is a painless test that uses sound waves to create images of your heart. It provides your doctor with information about the size and shape of your heart and how well your heart's chambers and valves are working. You may receive an ultrasound enhancing agent through an IV if needed to better visualize your heart during the echo.This procedure takes approximately one hour. There are no restrictions for this procedure. This will take place at the [address].     Follow-Up: At [facility], you and your health needs are our priority.  As part of our continuing mission to provide you with exceptional heart care, we have created designated Provider Care Teams.  These Care Teams include your primary Cardiologist (physician) and Advanced Practice Providers (APPs -  Physician Assistants and Nurse Practitioners) who all work together to provide you with the care you need, when you need it.  We  recommend signing up for the patient portal called "MyChart".  Sign up information is provided on this After Visit Summary.  MyChart is used to connect with patients for Virtual Visits (Telemedicine).  Patients are able to view lab/test results, encounter notes, upcoming appointments, etc.  Non-urgent messages can be sent to your provider as well.   To learn more about what you can do with MyChart, go to [email or web address].    Your next appointment:   4 month(s)  The format for your next appointment:   In Person  Provider:   Thurmon Fair, MD    Signed, Thurmon Fair, MD  04/20/2021 10:02 AM    [facility]

## 2021-04-17 DIAGNOSIS — Z419 Encounter for procedure for purposes other than remedying health state, unspecified: Secondary | ICD-10-CM | POA: Diagnosis not present

## 2021-04-20 ENCOUNTER — Encounter: Payer: Self-pay | Admitting: Cardiovascular Disease

## 2021-04-28 NOTE — Progress Notes (Deleted)
    SUBJECTIVE:   CHIEF COMPLAINT / HPI:   "Feet/ankles swelling": 60 year old female present for the above.  Home medications include amlodipine 10 mg daily.  She has an echo scheduled for 05/06/2021.  She states***.  PERTINENT  PMH / PSH: HTN  OBJECTIVE:   There were no vitals taken for this visit. ***  General: NAD, pleasant, able to participate in exam Cardiac: RRR, no murmurs. Respiratory: CTAB, normal effort, No wheezes, rales or rhonchi Extremities: *** Skin: warm and dry, no rashes noted Neuro: alert, no obvious focal deficits Psych: Normal affect and mood  ASSESSMENT/PLAN:   No problem-specific Assessment & Plan notes found for this encounter.     Jackelyn Poling, DO [facility]    {    This will disappear when note is signed, click to select method of visit    :1}

## 2021-04-29 ENCOUNTER — Ambulatory Visit: Payer: Medicaid Other | Admitting: Family Medicine

## 2021-05-01 NOTE — Progress Notes (Signed)
SUBJECTIVE:   CHIEF COMPLAINT / HPI:   "Feet/ankles pain": 60 year old female present for the above.  Home medications include amlodipine 10 mg daily.  She has an echo scheduled for 05/06/2021.  She states she missed her foot and ankle appt. She states her feet hurt, primarily on the top. The left hurts more. Has been hurting since October. No injuries or trauma. She recently got some sketchers shoes that were comfortable and it seemed to make things better. She got those about 3 months ago. She still has pain which affects her at various times throughout, including at rest. She thinks it's mostly associated with activities or a bit after.  She states she is not having pain in her foot right now but it does come and go at times.  She states she does have an echo coming up next week per her cardiologist to evaluate if there is any signs of heart failure.  Denies any shortness of breath.  Diabetic Follow Up: Patient is a 60 y.o. female who present today for diabetic follow up.   Patient endorses no problems  Home medications include: Metformin 1000 mg twice daily Patient endorses taking these medications as prescribed.  Most recent A1Cs:  Lab Results  Component Value Date   HGBA1C 6.6 05/02/2021   HGBA1C 6.5 01/03/2021   HGBA1C 6.3 (A) 03/22/2020   Last Microalbumin, LDL, Creatinine: Lab Results  Component Value Date   MICROALBUR 80 09/23/2016   LDLCALC 118 (H) 01/03/2021   CREATININE 0.70 02/12/2021   Patient does not check blood glucose on a regular basis.  Patient is not up to date on diabetic eye. Patient is not up to date on diabetic foot exam.   PERTINENT  PMH / PSH: HTN  OBJECTIVE:   BP 135/81    Pulse 81    Wt 251 lb 3.2 oz (113.9 kg)    SpO2 100%    BMI 37.10 kg/m    Diabetic foot exam was performed.  No deformities or other abnormal visual findings.  Posterior tibialis and dorsalis pulse intact bilaterally.  Intact to touch and monofilament testing  bilaterally.    General: NAD, pleasant, able to participate in exam Respiratory: No respiratory distress, clear to auscultation bilaterally Heart: RRR, no murmurs Extremities: No TTP on dorsum or plantar aspect of the feet.  Normal range of motion.  Trace edema bilaterally in lower extremities.  Patient overpronates while both standing and ambulating and demonstrates pes planus.  Skin: warm and dry, no rashes noted Neuro: alert, no obvious focal deficits Psych: Normal affect and mood  ASSESSMENT/PLAN:   Type 2 diabetes mellitus with diabetic neuropathy, unspecified (HCC) A1c today of 6.6.  Home medications of metformin 1000 mg twice daily.  Diabetic foot exam performed today.  Recommended diabetic eye exam.  Foot and ankle pain, bilateral: Has been going on for a few months.  Mildly improved after buying some new shoes.  Seems bilateral but she has slightly worse pain in the left.  No pain with palpation today.  Normal ankle range of motion's.  No history of trauma.  She does demonstrate pes planus and overpronates when both standing and ambulating.  I suspect her discomfort is due to pes planus and she would benefit from arch support.  She did previously have a referral for foot and ankle specialist and requested their number to follow back up with them.  I think this is reasonable and recommend she follow-up with me about a week after seeing  them.  I discussed with her that we can have talks about arch support at our next appointment.  I do not expect her discomfort is due to diabetic neuropathy as her A1c has been controlled.    Lower extremity edema, trace: She does have trace amount of edema bilaterally and I expect this is due to her amlodipine.  Her lungs are clear to auscultation today.  She does have an echo set up next week per her cardiologist.  This will help evaluate if her trace edema is due to heart pump function versus amlodipine or another cause.  This is not her main complaint  today and so will defer this to follow-up appointment as we have no concerning findings.  Discussed return cautions if she develops any shortness of breath or chest pain   Tanya Poling, DO [facility]

## 2021-05-02 ENCOUNTER — Ambulatory Visit (INDEPENDENT_AMBULATORY_CARE_PROVIDER_SITE_OTHER): Payer: Medicaid Other | Admitting: Family Medicine

## 2021-05-02 ENCOUNTER — Other Ambulatory Visit: Payer: Self-pay

## 2021-05-02 VITALS — BP 135/81 | HR 81 | Wt 251.2 lb

## 2021-05-02 DIAGNOSIS — E1140 Type 2 diabetes mellitus with diabetic neuropathy, unspecified: Secondary | ICD-10-CM | POA: Diagnosis not present

## 2021-05-02 LAB — POCT GLYCOSYLATED HEMOGLOBIN (HGB A1C): HbA1c, POC (controlled diabetic range): 6.6 % (ref 0.0–7.0)

## 2021-05-02 NOTE — Assessment & Plan Note (Signed)
A1c today of 6.6.  Home medications of metformin 1000 mg twice daily.  Diabetic foot exam performed today.  Recommended diabetic eye exam.

## 2021-05-02 NOTE — Patient Instructions (Addendum)
Referral faxed to [facility] [place] location.  They will review and call patient for an appt. Tanya Owens   [address] [place], [place] [address]   Phone: [phone] Fax: [phone]   Your A1c today is excellent.  I do not expect this is the cause of your foot discomfort.  I do think the cause of your foot discomfort is poor arch support.  I would like for you to reach out to foot and ankle specialist as above.  If they are not able to help you we can consider having an appointment with myself to discuss arch support.  If you develop any shortness of breath or chest pain I would like for you to be seen sooner, otherwise feel free to return with me after seeing the foot and ankle specialist.  I do recommend that she get both colonoscopy and a diabetic eye exam-both of these are recommended health maintenance items for you.

## 2021-05-06 ENCOUNTER — Ambulatory Visit (HOSPITAL_COMMUNITY): Payer: Medicaid Other | Attending: Cardiology

## 2021-05-06 ENCOUNTER — Other Ambulatory Visit: Payer: Self-pay

## 2021-05-06 DIAGNOSIS — R0602 Shortness of breath: Secondary | ICD-10-CM | POA: Insufficient documentation

## 2021-05-06 LAB — ECHOCARDIOGRAM COMPLETE
Area-P 1/2: 3.99 cm2
S' Lateral: 3.6 cm

## 2021-05-13 ENCOUNTER — Other Ambulatory Visit: Payer: Self-pay | Admitting: *Deleted

## 2021-05-13 DIAGNOSIS — R29818 Other symptoms and signs involving the nervous system: Secondary | ICD-10-CM

## 2021-05-18 DIAGNOSIS — Z419 Encounter for procedure for purposes other than remedying health state, unspecified: Secondary | ICD-10-CM | POA: Diagnosis not present

## 2021-05-23 ENCOUNTER — Other Ambulatory Visit: Payer: Self-pay | Admitting: Family Medicine

## 2021-05-23 DIAGNOSIS — E1140 Type 2 diabetes mellitus with diabetic neuropathy, unspecified: Secondary | ICD-10-CM

## 2021-06-11 ENCOUNTER — Other Ambulatory Visit: Payer: Self-pay | Admitting: Family Medicine

## 2021-06-18 DIAGNOSIS — Z419 Encounter for procedure for purposes other than remedying health state, unspecified: Secondary | ICD-10-CM | POA: Diagnosis not present

## 2021-07-16 DIAGNOSIS — Z419 Encounter for procedure for purposes other than remedying health state, unspecified: Secondary | ICD-10-CM | POA: Diagnosis not present

## 2021-07-20 NOTE — Progress Notes (Signed)
? ? ?  SUBJECTIVE:  ? ?CHIEF COMPLAINT / HPI:  ? ?Left Ankle pain: ?Has been hurting for 3 weeks, the left seems to hurt worse than the right. She denies any trauma or injuries, she changed to a new pair of shoes but it didn't improve her pain. She has also tried thicker socks without relief. ? ?Stomach pain: ?Hurts on the right side of the stomach about twice per week. No association with foods. Says it feels like a tightening for 30-40 mins when it comes on. No association with bowel movements. No fevers lately. No vomiting or diarrhea.  She previously was on laxatives but discontinued these recently.  States that she normally has bowel movements about 2 or 3 times per week and that she has a history of constipation. ? ?PERTINENT  PMH / PSH: Type 2 diabetes ? ?OBJECTIVE:  ? ?BP 137/87   Pulse 96   Wt 222 lb (100.7 kg)   SpO2 100%   BMI 32.78 kg/m?   ? ?General: NAD, pleasant, able to participate in exam ?Respiratory: No respiratory distress ?MSK: Left ankle with some swelling around the ankle joint which is not warm to the touch and not erythematous.  She has a moderate discomfort with palpation on the medial and lateral aspect of the ankle joint as well as some minor discomfort when moving the ankle joint in all directions.  She has good range of motion with no restriction.  She has normal sensation on the plantar aspect of her left foot. ?Psych: Normal affect and mood ? ?ASSESSMENT/PLAN:  ? ? ?Left ankle pain: ?Suspect due to overpronation.  She denies trauma or injury.  She has changed shoes to see if this would improve her symptoms but she is wearing flip-flops which do not give much support.  On physical exam she has some minor swelling to the left ankle but is not erythematous and not warm to palpation, she has good range of motion in all directions.  She has minimal discomfort when palpating the ankle joint.  She denies any fevers.  On observing her ambulate she has significant overpronation as well as  outward deviation of the forefoot when ambulating.  I discussed the case with Dr. Jennette Kettle who recommended that a referral for sports medicine may provide her some benefit.  Ultimately I think orthotics may help her.  We will place that referral. ? ?Abdominal discomfort: ?Suspect due to constipation.  It is in her right upper quadrant but she has no findings suggestive of gallbladder etiology and has a negative pseudo-Murphy sign on physical exam.  She has no blood in her stools, she recently did discontinue a laxative medicine and is constipated at baseline.  I discussed that I recommend starting MiraLAX.  I discussed red flag symptoms and reasons to go to the ED.  We will start MiraLAX and titrate up for soft bowel movement daily.  If this does not improve the pain or symptoms she is going to follow-up.  If it does she does not have to follow-up for this. ?  ? ? ?Jackelyn Poling, DO ?[facility]  ?

## 2021-07-21 ENCOUNTER — Other Ambulatory Visit: Payer: Self-pay

## 2021-07-21 ENCOUNTER — Ambulatory Visit (INDEPENDENT_AMBULATORY_CARE_PROVIDER_SITE_OTHER): Payer: Medicaid Other | Admitting: Family Medicine

## 2021-07-21 ENCOUNTER — Ambulatory Visit (INDEPENDENT_AMBULATORY_CARE_PROVIDER_SITE_OTHER): Payer: Medicaid Other

## 2021-07-21 VITALS — BP 137/87 | HR 96 | Wt 222.0 lb

## 2021-07-21 DIAGNOSIS — R109 Unspecified abdominal pain: Secondary | ICD-10-CM

## 2021-07-21 DIAGNOSIS — M25572 Pain in left ankle and joints of left foot: Secondary | ICD-10-CM

## 2021-07-21 DIAGNOSIS — Z23 Encounter for immunization: Secondary | ICD-10-CM | POA: Diagnosis not present

## 2021-07-21 MED ORDER — POLYETHYLENE GLYCOL 3350 17 GM/SCOOP PO POWD: 17.0000 g | Freq: Two times a day (BID) | ORAL | 1 refills | Status: AC | PRN

## 2021-07-21 MED ORDER — TRIAMCINOLONE ACETONIDE 0.5 % EX OINT: TOPICAL_OINTMENT | CUTANEOUS | 0 refills | Status: DC

## 2021-07-21 NOTE — Patient Instructions (Addendum)
I placed referral for sports medicine and they should reach out to you in the next 1 to 2 weeks to set up an appointment.  They may do an ultrasound to look at your foot but I think consideration for orthotics may help you with the ankle pain. ? ?For the abdominal pain I would recommend you try MiraLAX.  I want you to increase the dosing of this so that you have a soft bowel movement every day.  If you have any further pain what you are at a regimen doing this please let me know but if this resolves that issue then that is the answer.  I recommend starting with 1 scoop daily and increase to 2 scoops daily. ?

## 2021-07-25 ENCOUNTER — Ambulatory Visit
Admission: RE | Admit: 2021-07-25 | Discharge: 2021-07-25 | Disposition: A | Discharge status: Home / Self Care | Payer: Medicaid Other | Source: Ambulatory Visit | Attending: Sports Medicine | Admitting: Sports Medicine

## 2021-07-25 ENCOUNTER — Ambulatory Visit (INDEPENDENT_AMBULATORY_CARE_PROVIDER_SITE_OTHER): Payer: Medicaid Other | Admitting: Sports Medicine

## 2021-07-25 VITALS — BP 140/83 | Ht 69.0 in | Wt 222.0 lb

## 2021-07-25 DIAGNOSIS — M2141 Flat foot [pes planus] (acquired), right foot: Secondary | ICD-10-CM | POA: Diagnosis not present

## 2021-07-25 DIAGNOSIS — M25474 Effusion, right foot: Secondary | ICD-10-CM

## 2021-07-25 DIAGNOSIS — E1140 Type 2 diabetes mellitus with diabetic neuropathy, unspecified: Secondary | ICD-10-CM

## 2021-07-25 DIAGNOSIS — M069 Rheumatoid arthritis, unspecified: Secondary | ICD-10-CM

## 2021-07-25 DIAGNOSIS — M19079 Primary osteoarthritis, unspecified ankle and foot: Secondary | ICD-10-CM

## 2021-07-25 DIAGNOSIS — M25571 Pain in right ankle and joints of right foot: Secondary | ICD-10-CM

## 2021-07-25 DIAGNOSIS — M2142 Flat foot [pes planus] (acquired), left foot: Secondary | ICD-10-CM | POA: Diagnosis not present

## 2021-07-25 DIAGNOSIS — M25471 Effusion, right ankle: Secondary | ICD-10-CM

## 2021-07-25 DIAGNOSIS — M25472 Effusion, left ankle: Secondary | ICD-10-CM

## 2021-07-25 DIAGNOSIS — M25572 Pain in left ankle and joints of left foot: Secondary | ICD-10-CM

## 2021-07-25 DIAGNOSIS — M25475 Effusion, left foot: Secondary | ICD-10-CM | POA: Diagnosis not present

## 2021-07-25 DIAGNOSIS — M7989 Other specified soft tissue disorders: Secondary | ICD-10-CM | POA: Diagnosis not present

## 2021-07-25 MED ORDER — DICLOFENAC SODIUM 1 % EX GEL: 4.0000 g | Freq: Four times a day (QID) | CUTANEOUS | 1 refills | Status: DC

## 2021-07-25 NOTE — Progress Notes (Signed)
PCP: [name], DO ? ?Subjective:  ? ?HPI: ?Patient is a 61 y.o. female here for bilateral ankle pain and swelling, left > right. ? ?Pain has been present for a few years but has worsened over the last month. Denies any injury or inciting event.  She has pain over the lateral and medial ankle as well as the entire forefoot.  She does have swelling.  She wears compression stockings which do help sometimes.  Her primary doctor did recently try increasing her diuretic medication which helped to a slight extent.  She does note that she has been told she is flat-footed before.  She gets pain throughout the days, but is worse with activities such as prolonged walking.  ? ?She reports a history of rheumatoid arthritis on Enbrel.  This has helped improve her overall arthritic pains.  She is unable to take NSAID medications, though she takes Tylenol for the pain. ?She is on amlodipine 10 mg.  She is also diabetic for many years, her last A1c was 6.3 on 03/22/2020. ? ?Does have x-rays of bilateral ankles from January 2019 which shows diffuse soft tissue swelling and degenerative changes throughout the ankle. ? ?Did independently review last note from family medicine physician, patient does take tramadol occasionally for pain control.  She is also on Enbrel for her rheumatoid arthritis. ? ?Past Medical History:  ?Diagnosis Date  ? Arthritis 2000  ? Diabetes mellitus without complication ([place]) [id number]  ? Hypercholesteremia 2005  ? Hypertension 2005  ? Sleep apnea 2010  ? Sleep disorder   ? ? ?Current Outpatient Medications on File Prior to Visit  ?Medication Sig Dispense Refill  ? amLODipine (NORVASC) 10 MG tablet TAKE 1 TABLET(10 MG) BY MOUTH DAILY 90 tablet 1  ? atorvastatin (LIPITOR) 80 MG tablet Take 1 tablet (80 mg total) by mouth daily. 90 tablet 3  ? Etanercept 50 MG/ML SOCT Inject into the skin.    ? gabapentin (NEURONTIN) 100 MG capsule TAKE 1 CAPSULE(100 MG) BY MOUTH THREE TIMES DAILY 90 capsule 3  ?  hydrochlorothiazide (HYDRODIURIL) 25 MG tablet Take 1 tablet (25 mg total) by mouth daily. 90 tablet 0  ? ketoconazole (NIZORAL) 2 % cream APPLY TOPICALLY TO THE AFFECTED AREA TWICE DAILY 15 g 0  ? lisinopril (ZESTRIL) 40 MG tablet Take 1 tablet (40 mg total) by mouth daily. 90 tablet 3  ? metFORMIN (GLUCOPHAGE) 1000 MG tablet TAKE 1 TABLET(1000 MG) BY MOUTH TWICE DAILY WITH A MEAL 180 tablet 2  ? phenazopyridine (PYRIDIUM) 200 MG tablet Take 1 tablet (200 mg total) by mouth 3 (three) times daily. 6 tablet 0  ? polyethylene glycol powder (GLYCOLAX/MIRALAX) 17 GM/SCOOP powder Take 17 g by mouth 2 (two) times daily as needed. 3350 g 1  ? triamcinolone ointment (KENALOG) 0.5 % APPLY TOPICALLY TO THE AFFECTED AREA TWICE DAILY 30 g 0  ? ?No current facility-administered medications on file prior to visit.  ? ? ?Past Surgical History:  ?Procedure Laterality Date  ? CARDIAC CATHETERIZATION  10/18/2007  ? Normal coronaries, systemic hypertension  ? ? ?Allergies  ?Allergen Reactions  ? Tylenol [Acetaminophen] Other (See Comments)  ?  Stomach cramps ?  ? ? ?BP 140/83   Ht 5\' 9"  (1.753 m)   Wt 222 lb (100.7 kg)   BMI 32.78 kg/m?  ? ? ?DG Ankle Complete Right ?CLINICAL DATA:  Bilateral ankle pain. ? ?EXAM: ?RIGHT ANKLE - COMPLETE 3+ VIEW ? ?COMPARISON:  Right ankle radiographs 05/28/2017 ? ?FINDINGS: ?The ankle mortise  is symmetric and intact. Moderate distal medial ?malleolar and medial and lateral talar degenerative osteophytes. ?Moderate to high-grade lucent cystic change within the mid and ?medial aspect of the talar dome measuring up to approximately 14 mm ?in transverse dimension. Mild medial tibiotalar joint space ?narrowing. Mild anterior and posterior tibial plafond endplate ?spurring with moderate anterior tibiotalar joint space narrowing. ?Moderate to high-grade dorsal talar neck degenerative osteophytosis. ?Severe posterior subtalar joint and moderate calcaneocuboid ?talonavicular and naviculocuneiform joint space  narrowing. Moderate ?calcaneal heel spur. ? ?No acute fracture or dislocation. ? ?IMPRESSION: ?Moderate tibiotalar, posterior subtalar, and midfoot osteoarthritis. ? ?Moderate calcaneal heel spur. ? ?Electronically Signed ?  By: Yvonne Kendall M.D. ?  On: 07/25/2021 09:12 ?DG Ankle Complete Left ?CLINICAL DATA:  Bilateral ankle pain and swelling of unspecified ?chronicity. No reported trauma ? ?EXAM: ?LEFT ANKLE COMPLETE - 3+ VIEW ? ?COMPARISON:  05/28/2017 ? ?FINDINGS: ?Nonspecific soft tissue swelling around the ankle. Mild malleolus ?spurring. Greater spurring at the subtalar joint and talonavicular ?joint. No evidence of fracture, erosion, or joint effusion. ?Generalized osteopenia. Heel spur. ? ?IMPRESSION: ?1. Soft tissue swelling without acute osseous finding. ?2. Mild spurring at the ankle. Greater spurring at the subtalar and ?talonavicular joints. ? ?Electronically Signed ?  By: Jorje Guild M.D. ?  On: 07/25/2021 09:11 ? ? ? ?    ?Objective:  ?Physical Exam: ? ?Gen: Well-appearing, in no acute distress; non-toxic ?CV: Regular Rate. Well-perfused. Warm.  ?Resp: Breathing unlabored on room air; no wheezing. ?Psych: Fluid speech in conversation; appropriate affect; normal thought process ?Neuro: Sensation intact throughout. No gross coordination deficits.  ?MSK:  ?- Bilateral ankles: There is generalized tenderness to palpation over the anterior talus, distal mid foot as well as bilateral malleoli, although no specific bony tenderness.  There is 1-2+ soft tissue swelling over the lateral medial aspects of the ankle, less in the foot.  There is significant pes planus deformity.  There is complete loss of the transverse and longitudinal arch.  She has moderate to severe hindfoot valgus collapse.  She does ambulate antalgic leg with both feet externally rotated about 50 degrees.  Ligamental testing with inversion/eversion and plantar/dorsiflexion is somewhat distracted in all direction.  Sensation to light  touch intact throughout the foot.  There is some xerosis of bilateral feet. ? ?  ?Assessment & Plan:  ?1. Bilateral ankle and foot pain ?2. 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 Hx of rheumatoid arthritis on Enbrel ?4. Type II DM ?5. Bilateral ankle swelling ? ?Patient presents with chronic exacerbation of her bilateral foot and ankle pain.  She also has chronic swelling of the ankles which does cause her some pain and issues.  Upon examination she has severe pes planus deformity with significant hindfoot valgus collapse.  Independently reviewed bilateral ankle x-rays which show at least moderate to severe ankle and foot arthritis.  She does have a history of diabetes with neuropathy as well, her foot findings could be indicative of early Charcot deformity versus advanced arthritis.  She does have a history of rheumatoid arthritis managed on Enbrel.  For the swelling and pain, we did fit the patient for bilateral body helix compression sleeves that she found to be comfortable.  Tolerant to NSAIDs, so we encouraged her to stop using these as needed.  She may use Tylenol.  I also instructed the use of prescription for Voltaren gel to be used topically 3-4 times a day as needed.  I discussed her having a  conversation with her PCP to see if they could decrease the dose of her amlodipine which may help improve her lower extremity soft tissue swelling.  Ultimately I feel a custom pair of orthotics would help correct her deformity and improve her pain to some extent, although I discussed today we would likely not be able to get rid of her pain completely.  I did give her the CPT code for the orthotics, she may call her insurance carrier to see if this is covered.  If this is covered or she has desire, she may return for a pair of custom orthotics. ? ?Elba Barman, DO ?PGY-4, Sports Medicine Fellow ?[place] ? ?Addendum:  I was the preceptor for this visit  and available for immediate consultation.  Karlton Lemon MD CAQSM ? ? ?

## 2021-07-25 NOTE — Patient Instructions (Signed)
It was great to see you today, thank you for letting me participate in your care! ? ?Today, we discussed your ankle and foot pain.  You have quite severe arthritis within both of your ankles and feet.  There is swelling of the ankles, but this is more so in the soft tissue.  I know your primary doctor is increasing your water pill.  I think it would be smart for them to see if they may reduce the dosage of your amlodipine medication as this can contribute to ankle swelling. ? ?Things for you to do: ? ?- We will fit you for the ankle compression sleeves today that will help with the swelling and try to help minimize the pain. ?-You may try applying topical Voltaren gel over the painful areas of the ankle and feet --you may do this up to 3 times daily ?-We will have you call your insurance company to see if they cover the custom orthotics (CPT code: L3030), and if they do we can schedule an appointment to go to custom orthotics.  This would likely help correct how you walk and hopefully relieve her pain, although I am not sure that we can get rid of your pain completely. ? ? ?If you have any further questions, please give the clinic a call [phone]. ? ?Madelyn Brunner, DO ?[facility] ? ?

## 2021-08-01 DIAGNOSIS — M2550 Pain in unspecified joint: Secondary | ICD-10-CM | POA: Diagnosis not present

## 2021-08-01 DIAGNOSIS — Z79899 Other long term (current) drug therapy: Secondary | ICD-10-CM | POA: Diagnosis not present

## 2021-08-01 DIAGNOSIS — M0579 Rheumatoid arthritis with rheumatoid factor of multiple sites without organ or systems involvement: Secondary | ICD-10-CM | POA: Diagnosis not present

## 2021-08-06 ENCOUNTER — Ambulatory Visit: Payer: Medicaid Other | Admitting: Cardiovascular Disease

## 2021-08-06 DIAGNOSIS — H5213 Myopia, bilateral: Secondary | ICD-10-CM | POA: Diagnosis not present

## 2021-08-08 ENCOUNTER — Ambulatory Visit: Payer: Medicaid Other | Admitting: Cardiovascular Disease

## 2021-08-12 ENCOUNTER — Encounter: Payer: Self-pay | Admitting: Cardiovascular Disease

## 2021-08-16 DIAGNOSIS — Z419 Encounter for procedure for purposes other than remedying health state, unspecified: Secondary | ICD-10-CM | POA: Diagnosis not present

## 2021-09-07 ENCOUNTER — Other Ambulatory Visit: Payer: Self-pay | Admitting: Family Medicine

## 2021-09-07 DIAGNOSIS — I10 Essential (primary) hypertension: Secondary | ICD-10-CM

## 2021-09-07 DIAGNOSIS — E119 Type 2 diabetes mellitus without complications: Secondary | ICD-10-CM

## 2021-09-15 DIAGNOSIS — Z419 Encounter for procedure for purposes other than remedying health state, unspecified: Secondary | ICD-10-CM | POA: Diagnosis not present

## 2021-10-01 ENCOUNTER — Other Ambulatory Visit: Payer: Self-pay | Admitting: Family Medicine

## 2021-10-01 DIAGNOSIS — Z1231 Encounter for screening mammogram for malignant neoplasm of breast: Secondary | ICD-10-CM

## 2021-10-06 ENCOUNTER — Ambulatory Visit
Admission: RE | Admit: 2021-10-06 | Discharge: 2021-10-06 | Disposition: A | Discharge status: Home / Self Care | Payer: Medicaid Other | Source: Ambulatory Visit | Attending: Family Medicine | Admitting: Family Medicine

## 2021-10-06 DIAGNOSIS — Z1231 Encounter for screening mammogram for malignant neoplasm of breast: Secondary | ICD-10-CM | POA: Diagnosis not present

## 2021-10-07 ENCOUNTER — Other Ambulatory Visit: Payer: Self-pay | Admitting: Family Medicine

## 2021-10-07 DIAGNOSIS — E1140 Type 2 diabetes mellitus with diabetic neuropathy, unspecified: Secondary | ICD-10-CM

## 2021-10-16 DIAGNOSIS — Z419 Encounter for procedure for purposes other than remedying health state, unspecified: Secondary | ICD-10-CM | POA: Diagnosis not present

## 2021-10-21 ENCOUNTER — Encounter: Payer: Self-pay | Admitting: *Deleted

## 2021-10-29 ENCOUNTER — Ambulatory Visit (INDEPENDENT_AMBULATORY_CARE_PROVIDER_SITE_OTHER): Payer: Medicaid Other | Admitting: Family Medicine

## 2021-10-29 ENCOUNTER — Encounter: Payer: Self-pay | Admitting: Family Medicine

## 2021-10-29 ENCOUNTER — Other Ambulatory Visit (HOSPITAL_COMMUNITY)
Admission: RE | Admit: 2021-10-29 | Discharge: 2021-10-29 | Disposition: A | Discharge status: Home / Self Care | Payer: Medicaid Other | Source: Ambulatory Visit | Attending: Family Medicine | Admitting: Family Medicine

## 2021-10-29 VITALS — BP 140/82 | HR 79 | Ht 69.0 in | Wt 249.0 lb

## 2021-10-29 DIAGNOSIS — N898 Other specified noninflammatory disorders of vagina: Secondary | ICD-10-CM | POA: Insufficient documentation

## 2021-10-29 DIAGNOSIS — Z124 Encounter for screening for malignant neoplasm of cervix: Secondary | ICD-10-CM | POA: Diagnosis not present

## 2021-10-29 DIAGNOSIS — E785 Hyperlipidemia, unspecified: Secondary | ICD-10-CM | POA: Diagnosis not present

## 2021-10-29 DIAGNOSIS — E1140 Type 2 diabetes mellitus with diabetic neuropathy, unspecified: Secondary | ICD-10-CM

## 2021-10-29 DIAGNOSIS — E1169 Type 2 diabetes mellitus with other specified complication: Secondary | ICD-10-CM

## 2021-10-29 DIAGNOSIS — Z1211 Encounter for screening for malignant neoplasm of colon: Secondary | ICD-10-CM | POA: Diagnosis not present

## 2021-10-29 DIAGNOSIS — R21 Rash and other nonspecific skin eruption: Secondary | ICD-10-CM

## 2021-10-29 LAB — POCT GLYCOSYLATED HEMOGLOBIN (HGB A1C): HbA1c, POC (controlled diabetic range): 7.1 % — AB (ref 0.0–7.0)

## 2021-10-29 LAB — POCT WET PREP (WET MOUNT)
Clue Cells Wet Prep Whiff POC: POSITIVE
Trichomonas Wet Prep HPF POC: ABSENT

## 2021-10-29 MED ORDER — CLOBETASOL PROP EMOLLIENT BASE 0.05 % EX CREA: TOPICAL_CREAM | CUTANEOUS | 0 refills | Status: DC

## 2021-10-29 MED ORDER — KETOCONAZOLE 2 % EX CREA: TOPICAL_CREAM | CUTANEOUS | 0 refills | Status: DC

## 2021-10-29 MED ORDER — METRONIDAZOLE 500 MG PO TABS: 500.0000 mg | ORAL_TABLET | Freq: Two times a day (BID) | ORAL | 0 refills | Status: AC

## 2021-10-29 NOTE — Progress Notes (Signed)
    SUBJECTIVE:   CHIEF COMPLAINT / HPI:   Diabetic Follow Up: Patient is a 61 y.o. female who present today for diabetic follow up.   Home medications include: Metformin 1000 mg twice daily Patient endorses taking these medications as prescribed.  Most recent A1Cs:  Lab Results  Component Value Date   HGBA1C 7.1 (A) 10/29/2021   HGBA1C 6.6 05/02/2021   HGBA1C 6.5 01/03/2021   Last Microalbumin, LDL, Creatinine: Lab Results  Component Value Date   MICROALBUR 80 09/23/2016   LDLCALC 118 (H) 01/03/2021   CREATININE 0.70 02/12/2021     Encounter for Pap smear: 61 year old female presenting for encounter for Pap smear.  Last Pap smear was 5 years ago and was negative with negative HPV.  Groin rash: Present on the right inner thigh.  It similar to the rash under her breast which was diagnosed as intertrigo..  She states has been itchy and she is previously used triamcinolone ointment on it which has not resolved her symptoms  Vaginal discharge: Has been present for about 2 months.  States it is clear discharge and she is having some itching.  She request STD testing. PERTINENT  PMH / PSH: Type 2 diabetes  OBJECTIVE:   BP 140/82   Pulse 79   Ht 5\' 9"  (1.753 m)   Wt 249 lb (112.9 kg)   PF 98 L/min   BMI 36.77 kg/m    General: NAD, pleasant, able to participate in exam Cardiac: RRR, no murmurs. Respiratory: CTAB, normal effort, No wheezes, rales or rhonchi Abdomen: Bowel sounds present, nontender, nondistended, no hepatosplenomegaly. Pelvic exam: VULVA: normal appearing vulva with no masses, tenderness or lesions, VAGINA: normal appearing vagina with normal color and whitish discharge present discharge, no lesions, CERVIX: Normal with no lesions chaperone: Jessica Skin: Large plaque-like rash present on the right inner thigh with no skin breakage or surrounding erythema.  There is some lichenification suggesting that it is been pruritic for some time.  No bleeding or  tissue injury.  She states it is pruritic. Neuro: alert, no obvious focal deficits Psych: Normal affect and mood  ASSESSMENT/PLAN:   Type 2 diabetes mellitus with diabetic neuropathy, unspecified (HCC) A1c today of 7.1.  Current medications include metformin 1000 mg twice daily.  Recommended yearly diabetic eye exam.  Follow-up 3 months.   Health maintenance: Recommended screening colonoscopy to screen for colon cancer.  This order was placed today  Screening for cervical cancer: Pap smear completed as noted above.  We will follow-up with patient's results when they return.  Vaginal discharge: Been occurring for about 7 days and also associated with vaginal itching.  She request doing STD testing which we will do today.  Wet prep shows clue cells.  Will treat with metronidazole for BV.  Hyperlipidemia Previous LDL of 118 back in August 2022.  Currently on atorvastatin 80 mg daily.  We will check lipid panel today.  Rash: Present on the right inner thigh and has been there for some time.  She is previously used triamcinolone ointment without a lot of benefit.  Most likely etiology given presentation intertrigo and she has previously had a history of intertrigo under her breast.  We will refill her ketoconazole and have her follow-up with Derm clinic if it is not improving.  [name], [place]

## 2021-10-29 NOTE — Assessment & Plan Note (Signed)
A1c today of 7.1.  Current medications include metformin 1000 mg twice daily.  Recommended yearly diabetic eye exam.  Follow-up 3 months.

## 2021-10-29 NOTE — Patient Instructions (Addendum)
For your A1c I want you to continue on current medications.  We should recheck this in 3 months.  As discussed I recommend a colonoscopy to screen for colon cancer.  We will screen for cervical cancer with a Pap smear today and I will call you with results when it returns.  For your vaginal discharge we are using metronidazole for the next 7 days.  I will let you know the results when they return.  We are checking your cholesterol levels and I will let you know the results when it returns.  For the rash that you have we are refilling your ketoconazole for you to use for the next 2 to 4 weeks.  I like for you to come back in 2 to 4 weeks to see how it is doing.

## 2021-10-30 LAB — CERVICOVAGINAL ANCILLARY ONLY
Chlamydia: NEGATIVE
Comment: NEGATIVE
Comment: NORMAL
Neisseria Gonorrhea: NEGATIVE

## 2021-10-31 LAB — HIV ANTIBODY (ROUTINE TESTING W REFLEX): HIV Screen 4th Generation wRfx: NONREACTIVE

## 2021-10-31 LAB — LIPID PANEL
Chol/HDL Ratio: 3.1 ratio (ref 0.0–4.4)
Cholesterol, Total: 135 mg/dL (ref 100–199)
HDL: 44 mg/dL (ref >39)
LDL Chol Calc (NIH): 69 mg/dL (ref 0–99)
Triglycerides: 123 mg/dL (ref 0–149)
VLDL Cholesterol Cal: 22 mg/dL (ref 5–40)

## 2021-10-31 LAB — RPR: RPR Ser Ql: NONREACTIVE

## 2021-11-02 LAB — CYTOLOGY - PAP
Comment: NEGATIVE
Diagnosis: UNDETERMINED — AB
High risk HPV: NEGATIVE

## 2021-11-10 DIAGNOSIS — M0579 Rheumatoid arthritis with rheumatoid factor of multiple sites without organ or systems involvement: Secondary | ICD-10-CM | POA: Diagnosis not present

## 2021-11-10 DIAGNOSIS — Z79899 Other long term (current) drug therapy: Secondary | ICD-10-CM | POA: Diagnosis not present

## 2021-11-15 DIAGNOSIS — Z419 Encounter for procedure for purposes other than remedying health state, unspecified: Secondary | ICD-10-CM | POA: Diagnosis not present

## 2021-11-22 ENCOUNTER — Other Ambulatory Visit: Payer: Self-pay | Admitting: Family Medicine

## 2021-11-22 DIAGNOSIS — I10 Essential (primary) hypertension: Secondary | ICD-10-CM

## 2021-12-06 ENCOUNTER — Other Ambulatory Visit: Payer: Self-pay | Admitting: Family Medicine

## 2021-12-06 DIAGNOSIS — E119 Type 2 diabetes mellitus without complications: Secondary | ICD-10-CM

## 2021-12-06 DIAGNOSIS — I10 Essential (primary) hypertension: Secondary | ICD-10-CM

## 2021-12-16 DIAGNOSIS — Z419 Encounter for procedure for purposes other than remedying health state, unspecified: Secondary | ICD-10-CM | POA: Diagnosis not present

## 2021-12-23 ENCOUNTER — Other Ambulatory Visit: Payer: Self-pay | Admitting: Family Medicine

## 2021-12-23 DIAGNOSIS — E119 Type 2 diabetes mellitus without complications: Secondary | ICD-10-CM

## 2022-01-16 DIAGNOSIS — Z419 Encounter for procedure for purposes other than remedying health state, unspecified: Secondary | ICD-10-CM | POA: Diagnosis not present

## 2022-02-02 ENCOUNTER — Ambulatory Visit: Payer: Medicaid Other | Admitting: Student

## 2022-02-09 ENCOUNTER — Ambulatory Visit (INDEPENDENT_AMBULATORY_CARE_PROVIDER_SITE_OTHER): Payer: Medicaid Other | Admitting: Family Medicine

## 2022-02-09 ENCOUNTER — Encounter: Payer: Self-pay | Admitting: Family Medicine

## 2022-02-09 VITALS — BP 130/84 | HR 74 | Wt 248.0 lb

## 2022-02-09 DIAGNOSIS — E1140 Type 2 diabetes mellitus with diabetic neuropathy, unspecified: Secondary | ICD-10-CM | POA: Diagnosis not present

## 2022-02-09 DIAGNOSIS — Z23 Encounter for immunization: Secondary | ICD-10-CM | POA: Diagnosis not present

## 2022-02-09 DIAGNOSIS — M940 Chondrocostal junction syndrome [Tietze]: Secondary | ICD-10-CM | POA: Diagnosis not present

## 2022-02-09 LAB — POCT GLYCOSYLATED HEMOGLOBIN (HGB A1C): Hemoglobin A1C: 6.8 % — AB (ref 4.0–5.6)

## 2022-02-09 NOTE — Assessment & Plan Note (Signed)
History and exam consistent with costochondritis. Also considered abdominal process, though unremarkable imaging obtained since the pain occurred makes this less likely. Recommended ibuprofen 600 mg Q8 prn when pain arises. Advised to follow up if pain increases in intensity or remains for longer than her usual 1 day interval.

## 2022-02-09 NOTE — Assessment & Plan Note (Signed)
Updated A1c today 6.8 from 7.1. Continue current regimen.

## 2022-02-09 NOTE — Patient Instructions (Signed)
It was great to see you today! Here's what we talked about:  I believe this pain is due to something called costochondritis. For this, you can take 600 mg ibuprofen as needed every 8 hours. This will help to reduce inflammation in the muscle and help the pain. If this continues and does not get better like it usually does, please go to the emergency department.  Please let me know if you have any other questions.  Dr. Marcha Dutton

## 2022-02-09 NOTE — Progress Notes (Signed)
    SUBJECTIVE:   CHIEF COMPLAINT / HPI:   Pain in right side  Going on for 2-3 years. Comes and goes, most of the time lasts one day. Has about 2 times per year. Is in the right lower abdomen and side. Feels sharp. Does not endorse any pattern or trigger. No bowel changes when this occurs. No history of STIs that she knows of. No fevers, urinary changes during the episodes. When these episodes occur, she can feel a "knot" in the area that is harder than the other side. States the last time this happened, it was very bad, which prompted her to come in today.  PERTINENT  PMH / PSH: positive CCP antibody  OBJECTIVE:   BP 130/84   Pulse 74   Wt 248 lb (112.5 kg)   BMI 36.62 kg/m   General: Alert and oriented, in NAD Skin: Warm, dry HEENT: NCAT, EOM grossly normal, midline nasal septum Cardiac: RRR, no m/r/g appreciated Respiratory: CTAB, breathing and speaking comfortably on RA Abdominal: Soft, nontender throughout, nondistended; endorses correct pain location with palpation of lower ribs on R side Extremities: Moves all extremities grossly equally Neurological: No gross focal deficit Psychiatric: Appropriate mood and affect  ASSESSMENT/PLAN:   Costochondritis History and exam consistent with costochondritis. Also considered abdominal process, though unremarkable imaging obtained since the pain occurred makes this less likely. Recommended ibuprofen 600 mg Q8 prn when pain arises. Advised to follow up if pain increases in intensity or remains for longer than her usual 1 day interval.  Type 2 diabetes mellitus with diabetic neuropathy, unspecified ([place]) Updated A1c today 6.8 from 7.1. Continue current regimen.  Health maintenance Flu shot given without incident.   Ethelene Hal, MD [place]

## 2022-02-15 DIAGNOSIS — Z419 Encounter for procedure for purposes other than remedying health state, unspecified: Secondary | ICD-10-CM | POA: Diagnosis not present

## 2022-02-23 ENCOUNTER — Other Ambulatory Visit: Payer: Self-pay | Admitting: Family Medicine

## 2022-02-23 DIAGNOSIS — E1140 Type 2 diabetes mellitus with diabetic neuropathy, unspecified: Secondary | ICD-10-CM

## 2022-02-28 ENCOUNTER — Other Ambulatory Visit: Payer: Self-pay | Admitting: Student

## 2022-02-28 DIAGNOSIS — I10 Essential (primary) hypertension: Secondary | ICD-10-CM

## 2022-03-06 ENCOUNTER — Other Ambulatory Visit: Payer: Self-pay | Admitting: Student

## 2022-03-06 DIAGNOSIS — I10 Essential (primary) hypertension: Secondary | ICD-10-CM

## 2022-03-06 DIAGNOSIS — E119 Type 2 diabetes mellitus without complications: Secondary | ICD-10-CM

## 2022-03-18 DIAGNOSIS — Z419 Encounter for procedure for purposes other than remedying health state, unspecified: Secondary | ICD-10-CM | POA: Diagnosis not present

## 2022-03-25 ENCOUNTER — Other Ambulatory Visit: Payer: Self-pay | Admitting: Student

## 2022-03-25 DIAGNOSIS — E119 Type 2 diabetes mellitus without complications: Secondary | ICD-10-CM

## 2022-04-17 DIAGNOSIS — Z419 Encounter for procedure for purposes other than remedying health state, unspecified: Secondary | ICD-10-CM | POA: Diagnosis not present

## 2022-05-18 DIAGNOSIS — Z419 Encounter for procedure for purposes other than remedying health state, unspecified: Secondary | ICD-10-CM | POA: Diagnosis not present

## 2022-05-26 ENCOUNTER — Ambulatory Visit: Payer: Medicaid Other | Admitting: Student

## 2022-05-26 NOTE — Progress Notes (Deleted)
  SUBJECTIVE:   CHIEF COMPLAINT / HPI:   Chronic condition F/u:   Type {Blank single:19197::"1","2"} Diabetes: Home medications include: Metformin 1000 mg twice daily. {Blank single:19197::"Does","Does not"} endorse compliance. Home glucose monitoring {home testing:315145}.   Most recent A1Cs:  Lab Results  Component Value Date   HGBA1C 6.8 (A) 02/09/2022   HGBA1C 7.1 (A) 10/29/2021   Last Microalbumin, LDL, Creatinine: Lab Results  Component Value Date   MICROALBUR 80 09/23/2016   LDLCALC 69 10/29/2021   CREATININE 0.70 02/12/2021    Patient {rwisisnot:24883} up to date on diabetic eye. Patient {rwisisnot:24883} up to date on diabetic foot exam.  Hypertension:   today. Home medications include: 1240 mg, amlodipine 10 mg, hydrochlorothiazide 25 mg. She {Blank single:19197::"endorses","does not endorse"} taking these medications as prescribed. {Blank single:19197::"Does","Does not"} check blood pressure at home.*** Diet ***. Exercise ***. Most recent creatinine trend:  Lab Results  Component Value Date   CREATININE 0.70 02/12/2021   CREATININE 0.70 12/19/2019   CREATININE 0.71 04/19/2019   Patient {HAS HAS AST:41962} had a BMP in the past 1 year.    PERTINENT  PMH / PSH: Hypertension, diabetes, hyperlipidemia, obesity  Past Medical History:  Diagnosis Date   Arthritis 2000   Diabetes mellitus without complication ([place]) 2297   Hypercholesteremia 2005   Hypertension 2005   Sleep apnea 2010   Sleep disorder     OBJECTIVE:  There were no vitals taken for this visit. Physical Exam   ASSESSMENT/PLAN:  There are no diagnoses linked to this encounter. No follow-ups on file. Erskine Emery, MD 05/26/2022, 8:05 AM PGY-2, [place] {    This will disappear when note is signed, click to select method of visit    :1}

## 2022-06-04 ENCOUNTER — Other Ambulatory Visit: Payer: Self-pay | Admitting: Student

## 2022-06-04 DIAGNOSIS — E119 Type 2 diabetes mellitus without complications: Secondary | ICD-10-CM

## 2022-06-04 DIAGNOSIS — I10 Essential (primary) hypertension: Secondary | ICD-10-CM

## 2022-06-05 NOTE — Telephone Encounter (Signed)
Pt is scheduled for 06/22/2022 @830 

## 2022-06-18 ENCOUNTER — Other Ambulatory Visit: Payer: Self-pay | Admitting: Student

## 2022-06-18 DIAGNOSIS — Z419 Encounter for procedure for purposes other than remedying health state, unspecified: Secondary | ICD-10-CM | POA: Diagnosis not present

## 2022-06-18 DIAGNOSIS — I10 Essential (primary) hypertension: Secondary | ICD-10-CM

## 2022-06-22 ENCOUNTER — Ambulatory Visit (INDEPENDENT_AMBULATORY_CARE_PROVIDER_SITE_OTHER): Payer: Medicaid Other | Admitting: Student

## 2022-06-22 ENCOUNTER — Encounter: Payer: Self-pay | Admitting: Student

## 2022-06-22 VITALS — BP 135/85 | HR 80 | Ht 69.0 in | Wt 242.4 lb

## 2022-06-22 DIAGNOSIS — E1140 Type 2 diabetes mellitus with diabetic neuropathy, unspecified: Secondary | ICD-10-CM | POA: Diagnosis not present

## 2022-06-22 DIAGNOSIS — I10 Essential (primary) hypertension: Secondary | ICD-10-CM

## 2022-06-22 DIAGNOSIS — E782 Mixed hyperlipidemia: Secondary | ICD-10-CM | POA: Diagnosis not present

## 2022-06-22 LAB — POCT GLYCOSYLATED HEMOGLOBIN (HGB A1C): HbA1c, POC (controlled diabetic range): 6.6 % (ref 0.0–7.0)

## 2022-06-22 NOTE — Assessment & Plan Note (Signed)
UTD lipid panel ordered, last LDL 69 7 mo ago

## 2022-06-22 NOTE — Assessment & Plan Note (Signed)
well controlled - Last A1c:  Lab Results  Component Value Date   HGBA1C 6.6 06/22/2022   - Medications: Metformin - Compliance: Great!  - On statin - UACr ordered  - BMP UTD, lipid panel UTD ordered  - Optometrists provided, needs foot exam on next visit

## 2022-06-22 NOTE — Patient Instructions (Addendum)
It was great to see you today! Thank you for choosing [facility] for your primary care. Tanya Owens was seen for follow up.  Today we addressed: Continue with metformin  We will have you come in for a nursing visit to assess blood pressure, take all of your medications prior to this visit  Please bring your medications in at your next visit with me    Optometrists who accept Medicaid   Accepts Medicaid for Eye Exam and [place] [address] Phone: [phone]  Open Monday- Saturday from 9 AM to 5 PM Ages 6 months and older Se habla Espaol [facility] Phone: [phone] Open Monday -Friday (by appointment only) Ages 64 and older No se habla Espaol   [facility] at [facility] [place], [place] Phone: [phone] Open Monday-Saturday Ages 64 years and older Se habla Espaol  [facility] [place] [address] [place], [place]  Phone: [phone] Open Monday-Friday Ages 5 years and older  [place]. Phone: [phone] Open Monday-Friday Ages 64 and older No se habla Espaol  [facility] [place] [address] Phone: [phone] Age 64 year old and older Open [place] at [facility] Phone: [phone] Open Monday-Friday Ages 64 and older No se habla Espaol         Accepts Medicaid for Eye Exam only (will have to pay for glasses)  [place] Phone: [phone] Open 7 days per week Ages 5 and older (must know alphabet) No se [place]  Phone: [phone] Open 7 days per week Ages 64 and older (must know alphabet) No se habla Espaol   [place], [address] Phone: [phone] Open Monday-Saturday Ages 6 years and older [place] [address] [place] Phone: [phone] Open 7 days per week Ages 5 and older (must know alphabet) No se habla Espaol     If you haven't already, sign up for My Chart to have easy access to your labs results, and communication with your primary care physician.  We are checking some labs today. If they are abnormal, I will call you. If they are normal, I will send you a MyChart message (if it is active) or a letter in the mail. If you do not hear about your labs in the next 2 weeks, please call the office. I recommend that you always bring your medications to each appointment as this makes it easy to ensure you are on the correct medications and helps [place] not miss refills when you need them. Call the clinic at [phone] if your symptoms worsen or you have any concerns.  You should return to our clinic Return in about 2 weeks (around 07/06/2022) for Nursing visit for blood pressure . Please arrive 15 minutes before your appointment to ensure smooth check in process.  We appreciate your efforts in making this happen.  Thank you for allowing me to participate in your care, Erskine Emery, MD 06/22/2022, 9:03 AM PGY-2, [place]

## 2022-06-22 NOTE — Assessment & Plan Note (Signed)
BP: 135/85 today. Well controlled. Goal of <140/90. Continue to work on healthy dietary habits and exercise. Follow up in 2-3 weeks with nursing visit for recheck.   Medication regimen: Amlodipine, Lisinopril, HCTZ UTD BMP ordered

## 2022-06-22 NOTE — Progress Notes (Signed)
    SUBJECTIVE:   CHIEF COMPLAINT / HPI:   Type 2 Diabetes: Home medications include: Metformin 1000 mg BID. Does endorse compliance. Home glucose monitoring is performed regularly-CBGs throughout the day 110s-120s   She is doing well with her medications.   Most recent A1Cs:  Lab Results  Component Value Date   HGBA1C 6.6 06/22/2022   HGBA1C 6.8 (A) 02/09/2022   Last Microalbumin, LDL, Creatinine: Lab Results  Component Value Date   MICROALBUR 80 09/23/2016   LDLCALC 69 10/29/2021   CREATININE 0.70 02/12/2021    Patient is not up to date on diabetic eye. Patient is not up to date on diabetic foot exam.   PERTINENT  PMH / PSH:  HTN DM2 Plantar fasciitis  HLD  Obesity   OBJECTIVE:   BP 135/85   Pulse 80   Ht 5\' 9"  (1.753 m)   Wt 242 lb 6 oz (109.9 kg)   SpO2 100%   BMI 35.79 kg/m   General: Alert and oriented in no apparent distress; pleasant AAF Heart: Regular rate and rhythm with no murmurs appreciated Lungs: CTA bilaterally, no wheezing Abdomen: Bowel sounds present, no abdominal pain Skin: Warm and dry   ASSESSMENT/PLAN:   Type 2 diabetes mellitus with diabetic neuropathy, unspecified ([place]) well controlled - Last A1c:  Lab Results  Component Value Date   HGBA1C 6.6 06/22/2022   - Medications: Metformin - Compliance: Great!  - On statin - UACr ordered  - BMP UTD, lipid panel UTD ordered  - Optometrists provided, needs foot exam on next visit   Mixed hyperlipidemia UTD lipid panel ordered, last LDL 69 7 mo ago  HTN (hypertension) BP: 135/85 today. Well controlled. Goal of <140/90. Continue to work on healthy dietary habits and exercise. Follow up in 2-3 weeks with nursing visit for recheck.   Medication regimen: Amlodipine, Lisinopril, HCTZ UTD BMP ordered     Unable to get COVID vaccine today, not available in office.  Erskine Emery, MD [place]

## 2022-06-23 ENCOUNTER — Other Ambulatory Visit: Payer: Self-pay | Admitting: Student

## 2022-06-23 DIAGNOSIS — E119 Type 2 diabetes mellitus without complications: Secondary | ICD-10-CM

## 2022-06-24 ENCOUNTER — Ambulatory Visit: Payer: Medicaid Other

## 2022-06-25 LAB — BASIC METABOLIC PANEL
BUN/Creatinine Ratio: 10 — ABNORMAL LOW (ref 12–28)
BUN: 7 mg/dL — ABNORMAL LOW (ref 8–27)
CO2: 23 mmol/L (ref 20–29)
Calcium: 9.9 mg/dL (ref 8.7–10.3)
Chloride: 102 mmol/L (ref 96–106)
Creatinine, Ser: 0.72 mg/dL (ref 0.57–1.00)
Glucose: 102 mg/dL — ABNORMAL HIGH (ref 70–99)
Potassium: 3.7 mmol/L (ref 3.5–5.2)
Sodium: 141 mmol/L (ref 134–144)
eGFR: 94 mL/min/{1.73_m2} (ref >59)

## 2022-06-25 LAB — MICROALBUMIN / CREATININE URINE RATIO
Creatinine, Urine: 42.1 mg/dL
Microalb/Creat Ratio: 10 mg/g creat (ref 0–29)
Microalbumin, Urine: 4 ug/mL

## 2022-06-25 LAB — LIPID PANEL
Chol/HDL Ratio: 3 ratio (ref 0.0–4.4)
Cholesterol, Total: 138 mg/dL (ref 100–199)
HDL: 46 mg/dL (ref >39)
LDL Chol Calc (NIH): 71 mg/dL (ref 0–99)
Triglycerides: 119 mg/dL (ref 0–149)
VLDL Cholesterol Cal: 21 mg/dL (ref 5–40)

## 2022-06-26 ENCOUNTER — Other Ambulatory Visit: Payer: Self-pay

## 2022-06-26 ENCOUNTER — Other Ambulatory Visit: Payer: Self-pay | Admitting: Student

## 2022-06-26 DIAGNOSIS — E1140 Type 2 diabetes mellitus with diabetic neuropathy, unspecified: Secondary | ICD-10-CM

## 2022-06-27 MED ORDER — KETOCONAZOLE 2 % EX CREA: TOPICAL_CREAM | CUTANEOUS | 0 refills | Status: DC

## 2022-07-06 ENCOUNTER — Ambulatory Visit (INDEPENDENT_AMBULATORY_CARE_PROVIDER_SITE_OTHER): Payer: Medicaid Other

## 2022-07-06 VITALS — BP 130/72 | HR 85

## 2022-07-06 DIAGNOSIS — Z23 Encounter for immunization: Secondary | ICD-10-CM | POA: Diagnosis not present

## 2022-07-06 DIAGNOSIS — I10 Essential (primary) hypertension: Secondary | ICD-10-CM

## 2022-07-06 NOTE — Progress Notes (Signed)
Patient here today for BP check.      Last BP was on 06/22/2022 and was 135/85.  BP today is 130/72 with a pulse of 85.    Checked BP in left arm with large cuff.    Symptoms present: None.   Patient last took BP med(s) Amlodipine, Lisinopril and HCTZ today.

## 2022-07-14 DIAGNOSIS — Z79899 Other long term (current) drug therapy: Secondary | ICD-10-CM | POA: Diagnosis not present

## 2022-07-14 DIAGNOSIS — M2550 Pain in unspecified joint: Secondary | ICD-10-CM | POA: Diagnosis not present

## 2022-07-14 DIAGNOSIS — M0579 Rheumatoid arthritis with rheumatoid factor of multiple sites without organ or systems involvement: Secondary | ICD-10-CM | POA: Diagnosis not present

## 2022-07-17 DIAGNOSIS — Z419 Encounter for procedure for purposes other than remedying health state, unspecified: Secondary | ICD-10-CM | POA: Diagnosis not present

## 2022-07-31 ENCOUNTER — Other Ambulatory Visit: Payer: Self-pay | Admitting: Student

## 2022-07-31 DIAGNOSIS — I10 Essential (primary) hypertension: Secondary | ICD-10-CM

## 2022-08-17 DIAGNOSIS — Z419 Encounter for procedure for purposes other than remedying health state, unspecified: Secondary | ICD-10-CM | POA: Diagnosis not present

## 2022-09-16 DIAGNOSIS — Z419 Encounter for procedure for purposes other than remedying health state, unspecified: Secondary | ICD-10-CM | POA: Diagnosis not present

## 2022-10-01 ENCOUNTER — Other Ambulatory Visit: Payer: Self-pay | Admitting: Student

## 2022-10-01 DIAGNOSIS — E119 Type 2 diabetes mellitus without complications: Secondary | ICD-10-CM

## 2022-10-10 DIAGNOSIS — M069 Rheumatoid arthritis, unspecified: Secondary | ICD-10-CM | POA: Diagnosis not present

## 2022-10-10 DIAGNOSIS — M1990 Unspecified osteoarthritis, unspecified site: Secondary | ICD-10-CM | POA: Diagnosis not present

## 2022-10-10 DIAGNOSIS — E119 Type 2 diabetes mellitus without complications: Secondary | ICD-10-CM | POA: Diagnosis not present

## 2022-10-10 DIAGNOSIS — D8481 Immunodeficiency due to conditions classified elsewhere: Secondary | ICD-10-CM | POA: Diagnosis not present

## 2022-10-10 DIAGNOSIS — Z6837 Body mass index (BMI) 37.0-37.9, adult: Secondary | ICD-10-CM | POA: Diagnosis not present

## 2022-10-10 DIAGNOSIS — R32 Unspecified urinary incontinence: Secondary | ICD-10-CM | POA: Diagnosis not present

## 2022-10-10 DIAGNOSIS — Z791 Long term (current) use of non-steroidal anti-inflammatories (NSAID): Secondary | ICD-10-CM | POA: Diagnosis not present

## 2022-10-10 DIAGNOSIS — Z79631 Long term (current) use of antimetabolite agent: Secondary | ICD-10-CM | POA: Diagnosis not present

## 2022-10-10 DIAGNOSIS — I10 Essential (primary) hypertension: Secondary | ICD-10-CM | POA: Diagnosis not present

## 2022-10-10 DIAGNOSIS — D84821 Immunodeficiency due to drugs: Secondary | ICD-10-CM | POA: Diagnosis not present

## 2022-10-17 DIAGNOSIS — Z419 Encounter for procedure for purposes other than remedying health state, unspecified: Secondary | ICD-10-CM | POA: Diagnosis not present

## 2022-10-20 ENCOUNTER — Other Ambulatory Visit: Payer: Self-pay | Admitting: Student

## 2022-10-20 DIAGNOSIS — E1140 Type 2 diabetes mellitus with diabetic neuropathy, unspecified: Secondary | ICD-10-CM

## 2022-11-03 ENCOUNTER — Other Ambulatory Visit: Payer: Self-pay | Admitting: Student

## 2022-11-03 DIAGNOSIS — I10 Essential (primary) hypertension: Secondary | ICD-10-CM

## 2022-11-16 DIAGNOSIS — Z419 Encounter for procedure for purposes other than remedying health state, unspecified: Secondary | ICD-10-CM | POA: Diagnosis not present

## 2022-11-18 ENCOUNTER — Other Ambulatory Visit: Payer: Self-pay | Admitting: Student

## 2022-11-18 DIAGNOSIS — E119 Type 2 diabetes mellitus without complications: Secondary | ICD-10-CM

## 2022-11-23 ENCOUNTER — Ambulatory Visit (INDEPENDENT_AMBULATORY_CARE_PROVIDER_SITE_OTHER): Payer: Medicaid Other | Admitting: Student

## 2022-11-23 ENCOUNTER — Encounter: Payer: Self-pay | Admitting: Student

## 2022-11-23 VITALS — BP 148/80 | HR 86 | Ht 68.0 in | Wt 241.0 lb

## 2022-11-23 DIAGNOSIS — E119 Type 2 diabetes mellitus without complications: Secondary | ICD-10-CM

## 2022-11-23 DIAGNOSIS — I10 Essential (primary) hypertension: Secondary | ICD-10-CM

## 2022-11-23 DIAGNOSIS — M057A Rheumatoid arthritis with rheumatoid factor of other specified site without organ or systems involvement: Secondary | ICD-10-CM | POA: Diagnosis not present

## 2022-11-23 DIAGNOSIS — E1140 Type 2 diabetes mellitus with diabetic neuropathy, unspecified: Secondary | ICD-10-CM

## 2022-11-23 DIAGNOSIS — M069 Rheumatoid arthritis, unspecified: Secondary | ICD-10-CM | POA: Insufficient documentation

## 2022-11-23 LAB — POCT GLYCOSYLATED HEMOGLOBIN (HGB A1C): HbA1c, POC (controlled diabetic range): 6.2 % (ref 0.0–7.0)

## 2022-11-23 MED ORDER — LISINOPRIL 40 MG PO TABS: 40.0000 mg | ORAL_TABLET | Freq: Every day | ORAL | 3 refills | Status: DC

## 2022-11-23 MED ORDER — METFORMIN HCL 1000 MG PO TABS
ORAL_TABLET | ORAL | 0 refills | Status: DC
Start: 2022-11-23 — End: 2023-05-22

## 2022-11-23 MED ORDER — ATORVASTATIN CALCIUM 80 MG PO TABS: 80.0000 mg | ORAL_TABLET | Freq: Every day | ORAL | 0 refills | Status: DC

## 2022-11-23 MED ORDER — GABAPENTIN 100 MG PO CAPS
ORAL_CAPSULE | ORAL | 3 refills | Status: DC
Start: 2022-11-23 — End: 2023-03-15

## 2022-11-23 MED ORDER — HYDROCHLOROTHIAZIDE 25 MG PO TABS: 25.0000 mg | ORAL_TABLET | Freq: Every day | ORAL | 2 refills | Status: DC

## 2022-11-23 NOTE — Assessment & Plan Note (Signed)
Unsure what she is taking at home and do not want to drop her, hydrochlorothiazide and Lisinopril reordered with BMP today and future BMP in two weeks with nursing visit for BP check. If still high then, add back amlodipine.

## 2022-11-23 NOTE — Progress Notes (Deleted)
  SUBJECTIVE:   CHIEF COMPLAINT / HPI:   History of RA:  -Sees [facility] -Continuing with enbrel and methotrexate per recs  -Receives labs every 3 months, CRP/ESR, CBC w diff, CMP  -No hot or swollen joints, no cancer hx - Stiffness in AM ***    Type 2 Diabetes: Home medications include: ***. {Blank single:19197::"Does","Does not"} endorse compliance. Home glucose monitoring {home testing:315145}.   Most recent A1Cs:  Lab Results  Component Value Date   HGBA1C 6.6 06/22/2022   HGBA1C 6.8 (A) 02/09/2022   Last Microalbumin, LDL, Creatinine: Lab Results  Component Value Date   MICROALBUR 80 09/23/2016   LDLCALC 71 06/22/2022   CREATININE 0.72 06/22/2022    Patient {rwisisnot:24883} up to date on diabetic eye. Patient is not up to date on diabetic foot exam.    PERTINENT  PMH / PSH:  HTN DM2 Plantar fasciitis  HLD  Obesity   Patient Care Team: Alfredo Martinez, MD as PCP - General (Family Medicine) Croitoru, Rachelle Hora, MD as PCP - Cardiology (Cardiology) OBJECTIVE:  There were no vitals taken for this visit. Physical Exam   ASSESSMENT/PLAN:  There are no diagnoses linked to this encounter. No follow-ups on file. Alfredo Martinez, MD 11/23/2022, 10:11 AM PGY-2, [facility] {    This will disappear when note is signed, click to select method of visit    :1}

## 2022-11-23 NOTE — Assessment & Plan Note (Signed)
Referral to Dr. Dione Booze  Continue Metformin  Foot exam today  Statin rx ordered  A1C normal, well controlled

## 2022-11-23 NOTE — Progress Notes (Signed)
SUBJECTIVE:   CHIEF COMPLAINT / HPI:   History of RA:  -Sees [facility] -Continuing with enbrel and methotrexate per recs  -Receives labs every 3 months, CRP/ESR, CBC w diff, CMP  -No hot or swollen joints, no cancer hx -Stiffness in PM, swelling at night in the hands   Type 2 Diabetes: Home medications include: Metformin 1000 BID. Does endorse compliance. Home glucose monitoring is performed regularly.   Most recent A1Cs:  Lab Results  Component Value Date   HGBA1C 6.2 11/23/2022   HGBA1C 6.6 06/22/2022   Last Microalbumin, LDL, Creatinine: Lab Results  Component Value Date   MICROALBUR 80 09/23/2016   LDLCALC 71 06/22/2022   CREATININE 0.72 06/22/2022    Patient is not up to date on diabetic eye. Patient is not up to date on diabetic foot exam.  Requesting refills on all medications. Unable to remember if she has been taking all of her medications as she has empty bottles at home.  PERTINENT  PMH / PSH:  HTN DM2 Plantar fasciitis  HLD  Obesity   Patient Care Team: Alfredo Martinez, MD as PCP - General (Family Medicine) Croitoru, Rachelle Hora, MD as PCP - Cardiology (Cardiology) OBJECTIVE:  BP (!) 148/80 (BP Location: Left Arm, Patient Position: Sitting)   Pulse 86   Ht 5\' 8"  (1.727 m)   Wt 241 lb (109.3 kg)   SpO2 98%   BMI 36.64 kg/m  Physical Exam  General: Alert and oriented in no apparent distress Heart: Regular rate and rhythm with no murmurs appreciated Lungs: CTA bilaterally, no wheezing Abdomen: Bowel sounds present, no abdominal pain Skin: Warm and dry Extremities: No lower extremity edema  Diabetic Foot Exam - Simple   Simple Foot Form Visual Inspection No deformities, no ulcerations, no other skin breakdown bilaterally: Yes Sensation Testing Intact to touch and monofilament testing bilaterally: Yes Pulse Check Posterior Tibialis and Dorsalis pulse intact bilaterally: Yes Comments      ASSESSMENT/PLAN:  Type 2 diabetes mellitus  with diabetic neuropathy, without long-term current use of insulin (HCC) Assessment & Plan: Referral to Dr. 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 Will send to rheumatology if abnormal, has appt in 1 month with them.   Orders: -     CBC with Differential/Platelet -     C-reactive protein -     Sedimentation rate -     Comprehensive metabolic panel  Essential hypertension -     Lisinopril; Take 1 tablet (40 mg total) by mouth daily.  Dispense: 90 tablet; Refill: 3 -     Basic metabolic panel; Future  Type 2 diabetes mellitus without complication, without long-term current use of insulin (HCC) -     Atorvastatin Calcium; Take 1 tablet (80 mg total) by mouth daily.  Dispense: 90 tablet; Refill: 0 -     metFORMIN HCl; TAKE 1 TABLET(1000 MG) BY MOUTH TWICE DAILY WITH A MEAL  Dispense: 180 tablet; Refill: 0  Primary hypertension Assessment & Plan: Unsure what she is taking at home and do not want to drop her, hydrochlorothiazide and Lisinopril reordered with BMP today and future BMP in two weeks with nursing visit for BP check. If still high then, add back amlodipine.   Orders: -     hydroCHLOROthiazide; Take 1 tablet (25 mg total) by mouth daily.  Dispense: 30 tablet; Refill: 2  Return in about 2 weeks (around 12/07/2022) for nursing and lab visit . Alfredo Martinez, MD 11/23/2022, 4:32 PM PGY-3, [facility]

## 2022-11-23 NOTE — Assessment & Plan Note (Signed)
Rheumatology following, instructed her to call them for refill of Enbrel and methotrexate  CBC, CMP, CRP, ESR ordered per last note with rheum for routine blood work. Will send to rheumatology if abnormal, has appt in 1 month with them.

## 2022-11-23 NOTE — Patient Instructions (Addendum)
It was great to see you today! Thank you for choosing [facility] for your primary care. Tanya Owens was seen for labwork discussion.  Today we addressed: Getting labwork today, please call your rheumatologist and let them know you need the methotrexate  Rheumatology number [phone]  I have refilled your hydrochlorothiazide and Lisinopril, please schedule a nursing and lab visit in 2 weeks for blood work and to check blood pressure  I have referred you to the eye doctor as well. They will call for appt.   If you haven't already, sign up for My Chart to have easy access to your labs results, and communication with your primary care physician.  I recommend that you always bring your medications to each appointment as this makes it easy to ensure you are on the correct medications and helps [place] not miss refills when you need them. Call the clinic at [phone] if your symptoms worsen or you have any concerns.  You should return to our clinic Return in about 2 weeks (around 12/07/2022) for nursing and lab visit . Please arrive 15 minutes before your appointment to ensure smooth check in process.  We appreciate your efforts in making this happen.  Thank you for allowing me to participate in your care, Alfredo Martinez, MD 11/23/2022, 3:19 PM PGY-2, [facility]

## 2022-11-24 LAB — CBC WITH DIFFERENTIAL/PLATELET
Basophils Absolute: 0 10*3/uL (ref 0.0–0.2)
Basos: 1 %
EOS (ABSOLUTE): 0.2 10*3/uL (ref 0.0–0.4)
Eos: 2 %
Hematocrit: 37 % (ref 34.0–46.6)
Hemoglobin: 11.8 g/dL (ref 11.1–15.9)
Immature Grans (Abs): 0 10*3/uL (ref 0.0–0.1)
Immature Granulocytes: 0 %
Lymphocytes Absolute: 2.9 10*3/uL (ref 0.7–3.1)
Lymphs: 36 %
MCH: 27 pg (ref 26.6–33.0)
MCHC: 31.9 g/dL (ref 31.5–35.7)
MCV: 85 fL (ref 79–97)
Monocytes Absolute: 0.6 10*3/uL (ref 0.1–0.9)
Monocytes: 7 %
Neutrophils Absolute: 4.3 10*3/uL (ref 1.4–7.0)
Neutrophils: 54 %
Platelets: 211 10*3/uL (ref 150–450)
RBC: 4.37 x10E6/uL (ref 3.77–5.28)
RDW: 15.8 % — ABNORMAL HIGH (ref 11.7–15.4)
WBC: 8 10*3/uL (ref 3.4–10.8)

## 2022-11-24 LAB — COMPREHENSIVE METABOLIC PANEL
ALT: 24 IU/L (ref 0–32)
AST: 23 IU/L (ref 0–40)
Albumin: 4.3 g/dL (ref 3.9–4.9)
Alkaline Phosphatase: 103 IU/L (ref 44–121)
BUN/Creatinine Ratio: 17 (ref 12–28)
BUN: 11 mg/dL (ref 8–27)
Bilirubin Total: 0.4 mg/dL (ref 0.0–1.2)
CO2: 22 mmol/L (ref 20–29)
Calcium: 9.2 mg/dL (ref 8.7–10.3)
Chloride: 101 mmol/L (ref 96–106)
Creatinine, Ser: 0.64 mg/dL (ref 0.57–1.00)
Globulin, Total: 3.1 g/dL (ref 1.5–4.5)
Glucose: 127 mg/dL — ABNORMAL HIGH (ref 70–99)
Potassium: 3.5 mmol/L (ref 3.5–5.2)
Sodium: 136 mmol/L (ref 134–144)
Total Protein: 7.4 g/dL (ref 6.0–8.5)
eGFR: 99 mL/min/{1.73_m2} (ref >59)

## 2022-11-24 LAB — C-REACTIVE PROTEIN: CRP: 2 mg/L (ref 0–10)

## 2022-11-24 LAB — SEDIMENTATION RATE: Sed Rate: 20 mm/hr (ref 0–40)

## 2022-12-01 ENCOUNTER — Encounter: Payer: Self-pay | Admitting: Student

## 2022-12-07 ENCOUNTER — Other Ambulatory Visit: Payer: Medicaid Other

## 2022-12-07 ENCOUNTER — Ambulatory Visit: Payer: Medicaid Other

## 2022-12-17 ENCOUNTER — Ambulatory Visit
Admission: EM | Admit: 2022-12-17 | Discharge: 2022-12-17 | Disposition: A | Discharge status: Home / Self Care | Payer: Medicaid Other | Attending: Internal Medicine | Admitting: Internal Medicine

## 2022-12-17 DIAGNOSIS — J069 Acute upper respiratory infection, unspecified: Secondary | ICD-10-CM

## 2022-12-17 DIAGNOSIS — Z419 Encounter for procedure for purposes other than remedying health state, unspecified: Secondary | ICD-10-CM | POA: Diagnosis not present

## 2022-12-17 DIAGNOSIS — R051 Acute cough: Secondary | ICD-10-CM | POA: Diagnosis not present

## 2022-12-17 MED ORDER — BENZONATATE 100 MG PO CAPS: 100.0000 mg | ORAL_CAPSULE | Freq: Three times a day (TID) | ORAL | 0 refills | Status: DC | PRN

## 2022-12-17 MED ORDER — AMOXICILLIN-POT CLAVULANATE 875-125 MG PO TABS: 1.0000 | ORAL_TABLET | Freq: Two times a day (BID) | ORAL | 0 refills | Status: DC

## 2022-12-17 NOTE — ED Triage Notes (Signed)
Pt c/o nasal congestion, head congestion, chest congestion, sore throat, general malaise.  X 1 week    Pt has been taking tylenol to help with symptoms, helps for a short period of time, seems that symptoms have worsened.

## 2022-12-17 NOTE — Discharge Instructions (Signed)
I have prescribed an antibiotic and a cough medication for your upper respiratory infection.  Follow-up if any symptoms persist or worsen.

## 2022-12-17 NOTE — ED Provider Notes (Signed)
[facility]    CSN: [id number] Arrival date & time: 12/17/22  0805      History   Chief Complaint No chief complaint on file.   HPI Tanya Owens is a 62 y.o. female.   Patient presents with 8-day history of nasal congestion, cough, sore throat, fatigue.  Denies any known sick contacts or fever.  Denies chest pain or shortness of breath.  Denies history of asthma or COPD and patient denies that she smokes cigarettes.  Patient has taken Tylenol for symptoms with minimal improvement. Reports symptoms seem to be worsening.      Past Medical History:  Diagnosis Date   Arthritis 2000   Diabetes mellitus without complication (HCC) 2005   Hypercholesteremia 2005   Hypertension 2005   Sleep apnea 2010   Sleep disorder     Patient Active Problem List   Diagnosis Date Noted   Rheumatoid arthritis (HCC) 11/23/2022   Costochondritis 02/09/2022   DOE (dyspnea on exertion) 02/13/2021   Plantar fasciitis, bilateral 01/13/2021   Pes planus 01/06/2021   Intertrigo 08/12/2020   Pruritic rash 01/15/2020   Bilateral chronic knee pain 09/28/2019   Other chronic pain 08/14/2019   COVID-19 05/30/2019   Seronegative arthropathy of multiple sites (HCC) 07/30/2017   Encounter for medication management 04/19/2017   Acromioclavicular joint arthritis 03/26/2017   Frequent No-show for appointment 12/15/2016   Cyclic citrullinated peptide (CCP) antibody positive 10/28/2016   Obesity (BMI 30-39.9) 11/28/2014   Type 2 diabetes mellitus with diabetic neuropathy, unspecified (HCC) 11/28/2014   HTN (hypertension) 06/21/2013   Mixed hyperlipidemia 06/21/2013    Past Surgical History:  Procedure Laterality Date   CARDIAC CATHETERIZATION  10/18/2007   Normal coronaries, systemic hypertension    OB History   No obstetric history on file.      Home Medications    Prior to Admission medications   Medication Sig Start Date End Date Taking? Authorizing Provider   amoxicillin-clavulanate (AUGMENTIN) 875-125 MG tablet Take 1 tablet by mouth every 12 (twelve) hours. 12/17/22  Yes , Rolly Salter E, FNP  benzonatate (TESSALON) 100 MG capsule Take 1 capsule (100 mg total) by mouth every 8 (eight) hours as needed for cough. 12/17/22  Yes , Rolly Salter E, FNP  amLODipine (NORVASC) 10 MG tablet TAKE 1 TABLET(10 MG) BY MOUTH DAILY 11/04/22   Alfredo Martinez, MD  atorvastatin (LIPITOR) 80 MG tablet Take 1 tablet (80 mg total) by mouth daily. 11/23/22   Alfredo Martinez, MD  Etanercept 50 MG/ML SOCT Inject into the skin. 09/20/17   [provider]  gabapentin (NEURONTIN) 100 MG capsule TAKE 1 CAPSULE(100 MG) BY MOUTH THREE TIMES DAILY 11/23/22   Alfredo Martinez, MD  hydrochlorothiazide (HYDRODIURIL) 25 MG tablet Take 1 tablet (25 mg total) by mouth daily. 11/23/22   Alfredo Martinez, MD  ketoconazole (NIZORAL) 2 % cream APPLY TOPICALLY TO THE AFFECTED AREA TWICE DAILY 06/27/22   Alfredo Martinez, MD  lisinopril (ZESTRIL) 40 MG tablet Take 1 tablet (40 mg total) by mouth daily. 11/23/22   Alfredo Martinez, MD  metFORMIN (GLUCOPHAGE) 1000 MG tablet TAKE 1 TABLET(1000 MG) BY MOUTH TWICE DAILY WITH A MEAL 11/23/22   Alfredo Martinez, MD  phenazopyridine (PYRIDIUM) 200 MG tablet Take 1 tablet (200 mg total) by mouth 3 (three) times daily. 06/08/20   [name], Hallie C, PA-C  polyethylene glycol powder (GLYCOLAX/MIRALAX) 17 GM/SCOOP powder Take 17 g by mouth 2 (two) times daily as needed. 07/21/21   Jackelyn Poling, DO    Family  History Family History  Problem Relation Age of Onset   Heart attack Mother    Diabetes Mother    Hypercholesterolemia Mother    Hypertension Mother    Diabetes Sister    Hypertension Brother     Social History Social History   Tobacco Use   Smoking status: Former    Current packs/day: 0.00    Types: Cigarettes    Quit date: 12/27/2012    Years since quitting: 9.9   Smokeless tobacco: Never  Vaping Use   Vaping status: Never Used  Substance Use Topics    Alcohol use: No   Drug use: No     Allergies   Tylenol [acetaminophen]   Review of Systems Review of Systems Per HPI  Physical Exam Triage Vital Signs ED Triage Vitals  Encounter Vitals Group     BP 12/17/22 0813 (!) 140/82     Systolic BP Percentile --      Diastolic BP Percentile --      Pulse Rate 12/17/22 0813 93     Resp 12/17/22 0813 15     Temp 12/17/22 0813 98.3 F (36.8 C)     Temp Source 12/17/22 0813 Oral     SpO2 12/17/22 0813 99 %     Weight --      Height --      Head Circumference --      Peak Flow --      Pain Score 12/17/22 0817 8     Pain Loc --      Pain Education --      Exclude from Growth Chart --    No data found.  Updated Vital Signs BP (!) 140/82 (BP Location: Right Arm)   Pulse 93   Temp 98.3 F (36.8 C) (Oral)   Resp 15   SpO2 99%   Visual Acuity Right Eye Distance:   Left Eye Distance:   Bilateral Distance:    Right Eye Near:   Left Eye Near:    Bilateral Near:     Physical Exam Constitutional:      General: She is not in acute distress.    Appearance: Normal appearance. She is not toxic-appearing or diaphoretic.  HENT:     Head: Normocephalic and atraumatic.     Right Ear: Tympanic membrane and ear canal normal.     Left Ear: Tympanic membrane and ear canal normal.     Nose: Congestion present.     Mouth/Throat:     Mouth: Mucous membranes are moist.     Pharynx: Posterior oropharyngeal erythema present.  Eyes:     Extraocular Movements: Extraocular movements intact.     Conjunctiva/sclera: Conjunctivae normal.     Pupils: Pupils are equal, round, and reactive to light.  Cardiovascular:     Rate and Rhythm: Normal rate and regular rhythm.     Pulses: Normal pulses.     Heart sounds: Normal heart sounds.  Pulmonary:     Effort: Pulmonary effort is normal. No respiratory distress.     Breath sounds: Normal breath sounds. No stridor. No wheezing, rhonchi or rales.  Abdominal:     General: Abdomen is flat. Bowel  sounds are normal.     Palpations: Abdomen is soft.  Musculoskeletal:        General: Normal range of motion.     Cervical back: Normal range of motion.  Skin:    General: Skin is warm and dry.  Neurological:     General: No focal  deficit present.     Mental Status: She is alert and oriented to person, place, and time. Mental status is at baseline.  Psychiatric:        Mood and Affect: Mood normal.        Behavior: Behavior normal.        Thought Content: Thought content normal.        Judgment: Judgment normal.      UC Treatments / Results  Labs (all labs ordered are listed, but only abnormal results are displayed) Labs Reviewed - No data to display  EKG   Radiology No results found.  Procedures Procedures (including critical care time)  Medications Ordered in UC Medications - No data to display  Initial Impression / Assessment and Plan / UC Course  I have reviewed the triage vital signs and the nursing notes.  Pertinent labs & imaging results that were available during my care of the patient were reviewed by me and considered in my medical decision making (see chart for details).     Symptoms most likely started off as a viral illness but given duration of symptoms, I am concerned for secondary bacterial infection such as sinus infection.  Therefore, will opt to treat with Augmentin antibiotic.  Benzonatate prescribed to take as needed for cough.  Viral testing deferred given duration of symptoms as it would not change treatment.  Advised supportive care and symptom management.  There are no adventitious lung sounds on exam so chest imaging was deferred.  Advised strict return precautions.  Patient verbalized understanding and was agreeable with plan. Final Clinical Impressions(s) / UC Diagnoses   Final diagnoses:  Acute upper respiratory infection  Acute cough     Discharge Instructions      I have prescribed an antibiotic and a cough medication for your  upper respiratory infection.  Follow-up if any symptoms persist or worsen.    ED Prescriptions     Medication Sig Dispense Auth. Provider   amoxicillin-clavulanate (AUGMENTIN) 875-125 MG tablet Take 1 tablet by mouth every 12 (twelve) hours. 14 tablet [place], [place], [place]   benzonatate (TESSALON) 100 MG capsule Take 1 capsule (100 mg total) by mouth every 8 (eight) hours as needed for cough. 21 capsule [place], Acie Fredrickson, [place]      PDMP not reviewed this encounter.   Gustavus Bryant, [place] 12/17/22 [phone]

## 2023-01-17 DIAGNOSIS — Z419 Encounter for procedure for purposes other than remedying health state, unspecified: Secondary | ICD-10-CM | POA: Diagnosis not present

## 2023-02-01 ENCOUNTER — Ambulatory Visit: Payer: Medicaid Other | Admitting: Family Medicine

## 2023-02-05 ENCOUNTER — Encounter: Payer: Self-pay | Admitting: Family Medicine

## 2023-02-05 ENCOUNTER — Ambulatory Visit (INDEPENDENT_AMBULATORY_CARE_PROVIDER_SITE_OTHER): Payer: Medicaid Other | Admitting: Family Medicine

## 2023-02-05 VITALS — BP 137/78 | HR 78 | Wt 234.0 lb

## 2023-02-05 DIAGNOSIS — R109 Unspecified abdominal pain: Secondary | ICD-10-CM | POA: Insufficient documentation

## 2023-02-05 DIAGNOSIS — E278 Other specified disorders of adrenal gland: Secondary | ICD-10-CM | POA: Diagnosis not present

## 2023-02-05 DIAGNOSIS — R1011 Right upper quadrant pain: Secondary | ICD-10-CM

## 2023-02-05 NOTE — Progress Notes (Signed)
   SUBJECTIVE:   CHIEF COMPLAINT / HPI:   ABDOMINAL PAIN  Duration: ~10 years Onset: gradual Quality: sharp Location:  RUQ  Episode duration: 1-2 hours Radiation: no Frequency: intermittent Alleviating factors: tylenol Aggravating factors: moving Status: fluctuating Treatments attempted: tylenol Fever: no Nausea: no Vomiting: no Weight loss: no Decreased appetite: no Diarrhea: no Constipation: no Blood in stool: no Chest pain: no Shortness of breath: no Heartburn: no Jaundice: no Rash: no Dysuria/urinary frequency: no Hematuria: no Recurrent NSAID use: no   OBJECTIVE:   BP 137/78 (BP Location: Right Arm, Patient Position: Sitting, Cuff Size: Large)   Pulse 78   Wt 234 lb (106.1 kg)   SpO2 97%   BMI 35.58 kg/m   Gen: well appearing, in NAD Card: RRR Lungs: CTAB Abd: soft, obese abdomen. NTND. +BS. Negative Murphy sign. No CVA tenderness. Ext: WWP, no edema   ASSESSMENT/PLAN:   Abdominal pain Chronic, intermittent. Unclear etiology and doesn't seem to have precipitating or triggering factors. H/o fatty liver on previous US 2012, otherwise wnl. Previous LFTs wnl. Benign abdominal exam today with negative murphy sign and no CVA tenderness on exam, doubt GB pathology, peritonitis, mesenteric ischemia, pneumonia, dissection, renal stone. Will repeat RUQ [place] and LFTs. F/u in 1 month for recheck. Recommended keeping symptom log in the meantime.      Caro Laroche, DO

## 2023-02-05 NOTE — Patient Instructions (Signed)
It was great to see you!  Our plans for today:  - We are checking some labs today, we will send you a letter with these results.  - We are getting an ultrasound of your liver, gallbladder.  - Keep a log of when you have pain to see if there are any things contributing to your pain.  - Come back in 1 month for recheck.    Take care and seek immediate care sooner if you develop any concerns.   Dr. Linwood Dibbles

## 2023-02-05 NOTE — Assessment & Plan Note (Signed)
Chronic, intermittent. Unclear etiology and doesn't seem to have precipitating or triggering factors. H/o fatty liver on previous US 2012, otherwise wnl. Previous LFTs wnl. Benign abdominal exam today with negative murphy sign and no CVA tenderness on exam, doubt GB pathology, peritonitis, mesenteric ischemia, pneumonia, dissection, renal stone. Will repeat RUQ [place] and LFTs. F/u in 1 month for recheck. Recommended keeping symptom log in the meantime.

## 2023-02-06 LAB — COMPREHENSIVE METABOLIC PANEL
ALT: 16 IU/L (ref 0–32)
AST: 21 IU/L (ref 0–40)
Albumin: 4.5 g/dL (ref 3.9–4.9)
Alkaline Phosphatase: 105 IU/L (ref 44–121)
BUN/Creatinine Ratio: 13 (ref 12–28)
BUN: 10 mg/dL (ref 8–27)
Bilirubin Total: 0.8 mg/dL (ref 0.0–1.2)
CO2: 21 mmol/L (ref 20–29)
Calcium: 9.7 mg/dL (ref 8.7–10.3)
Chloride: 100 mmol/L (ref 96–106)
Creatinine, Ser: 0.75 mg/dL (ref 0.57–1.00)
Globulin, Total: 3.2 g/dL (ref 1.5–4.5)
Glucose: 80 mg/dL (ref 70–99)
Potassium: 4.3 mmol/L (ref 3.5–5.2)
Sodium: 140 mmol/L (ref 134–144)
Total Protein: 7.7 g/dL (ref 6.0–8.5)
eGFR: 89 mL/min/{1.73_m2} (ref >59)

## 2023-02-08 ENCOUNTER — Encounter: Payer: Self-pay | Admitting: Family Medicine

## 2023-02-11 ENCOUNTER — Ambulatory Visit (HOSPITAL_COMMUNITY): Payer: Medicaid Other

## 2023-02-15 ENCOUNTER — Ambulatory Visit (HOSPITAL_COMMUNITY)
Admission: RE | Admit: 2023-02-15 | Discharge: 2023-02-15 | Disposition: A | Discharge status: Home / Self Care | Payer: Medicaid Other | Source: Ambulatory Visit | Attending: Family Medicine | Admitting: Family Medicine

## 2023-02-15 ENCOUNTER — Telehealth: Payer: Self-pay

## 2023-02-15 DIAGNOSIS — K7689 Other specified diseases of liver: Secondary | ICD-10-CM | POA: Diagnosis not present

## 2023-02-15 DIAGNOSIS — R1011 Right upper quadrant pain: Secondary | ICD-10-CM | POA: Insufficient documentation

## 2023-02-15 NOTE — Telephone Encounter (Signed)
Received call from [facility] imaging with call report for RUQ [place]. See the following impression.   IMPRESSION: 1. Increased hepatic parenchymal echogenicity suggestive of steatosis. 2. 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 Recommend further evaluation with adrenal protocol CT or MRI. 3. No cholelithiasis or sonographic evidence for acute cholecystitis. 4. These results will be called to the ordering clinician or representative by the Radiologist Assistant, and communication documented in the PACS or [employer].  Forwarding to ordering provider.   Veronda Prude, RN

## 2023-02-15 NOTE — Addendum Note (Signed)
Addended by: Caro Laroche on: 02/15/2023 03:04 PM   Modules accepted: Orders

## 2023-02-16 DIAGNOSIS — Z419 Encounter for procedure for purposes other than remedying health state, unspecified: Secondary | ICD-10-CM | POA: Diagnosis not present

## 2023-03-01 DIAGNOSIS — M0579 Rheumatoid arthritis with rheumatoid factor of multiple sites without organ or systems involvement: Secondary | ICD-10-CM | POA: Diagnosis not present

## 2023-03-01 DIAGNOSIS — M25511 Pain in right shoulder: Secondary | ICD-10-CM | POA: Diagnosis not present

## 2023-03-01 DIAGNOSIS — M25512 Pain in left shoulder: Secondary | ICD-10-CM | POA: Diagnosis not present

## 2023-03-01 DIAGNOSIS — G8929 Other chronic pain: Secondary | ICD-10-CM | POA: Diagnosis not present

## 2023-03-01 DIAGNOSIS — Z79899 Other long term (current) drug therapy: Secondary | ICD-10-CM | POA: Diagnosis not present

## 2023-03-15 ENCOUNTER — Other Ambulatory Visit: Payer: Self-pay | Admitting: Student

## 2023-03-15 DIAGNOSIS — E1140 Type 2 diabetes mellitus with diabetic neuropathy, unspecified: Secondary | ICD-10-CM

## 2023-03-19 DIAGNOSIS — Z419 Encounter for procedure for purposes other than remedying health state, unspecified: Secondary | ICD-10-CM | POA: Diagnosis not present

## 2023-03-22 ENCOUNTER — Ambulatory Visit (INDEPENDENT_AMBULATORY_CARE_PROVIDER_SITE_OTHER): Payer: Medicaid Other | Admitting: Student

## 2023-03-22 ENCOUNTER — Encounter: Payer: Self-pay | Admitting: Student

## 2023-03-22 VITALS — BP 148/83 | HR 88 | Wt 227.2 lb

## 2023-03-22 DIAGNOSIS — E278 Other specified disorders of adrenal gland: Secondary | ICD-10-CM

## 2023-03-22 DIAGNOSIS — E782 Mixed hyperlipidemia: Secondary | ICD-10-CM

## 2023-03-22 DIAGNOSIS — E1140 Type 2 diabetes mellitus with diabetic neuropathy, unspecified: Secondary | ICD-10-CM

## 2023-03-22 DIAGNOSIS — R634 Abnormal weight loss: Secondary | ICD-10-CM

## 2023-03-22 DIAGNOSIS — Z23 Encounter for immunization: Secondary | ICD-10-CM

## 2023-03-22 NOTE — Patient Instructions (Addendum)
It was great to see you today! Thank you for choosing [facility] for your primary care.  Today we addressed: We are checking labs today We have to run the CT scan through insurance, will call you to schedule in about 1 week or so  I have referred you to the GI doctors, they will call in a couple of weeks   If you haven't already, sign up for My Chart to have easy access to your labs results, and communication with your primary care physician. I recommend that you always bring your medications to each appointment as this makes it easy to ensure you are on the correct medications and helps [place] not miss refills when you need them. Call the clinic at [phone] if your symptoms worsen or you have any concerns.  Please arrive 15 minutes before your appointment to ensure smooth check in process.  We appreciate your efforts in making this happen.  Thank you for allowing me to participate in your care, Alfredo Martinez, MD 03/22/2023, 4:06 PM PGY-3, [facility]

## 2023-03-22 NOTE — Progress Notes (Unsigned)
    SUBJECTIVE:   CHIEF COMPLAINT / HPI:   Abdominal Pain:  -Presenting for follow-up per Dr. Linwood Dibbles on last visit, approximately 10 years of right upper quadrant pain that is intermittent without red flag symptoms.  Right upper quadrant ultrasound showed fatty liver with adrenal mass requiring CT for further differentiation.  Otherwise CMP normal.  PERTINENT  PMH / PSH:  HTN - hydrochlorothiazide and Lisinopril DM2 - On Metformin, needs foot exam Plantar fasciitis  HLD - Needs repeat LDL Obesity  RA - Follows rheumatology   OBJECTIVE:   BP (!) 148/83   Pulse 88   Wt 227 lb 3.2 oz (103.1 kg)   SpO2 100%   BMI 34.55 kg/m   General: Alert and oriented in no apparent distress Heart: Regular rate and rhythm with no murmurs appreciated Lungs: CTA bilaterally, no wheezing Abdomen: Bowel sounds present, no abdominal pain Skin: Warm and dry Extremities: No lower extremity edema   ASSESSMENT/PLAN:   Assessment & Plan Adrenal mass (HCC)  Type 2 diabetes mellitus with diabetic neuropathy, without long-term current use of insulin (HCC)  Mixed hyperlipidemia  Weight loss      Tanya Martinez, MD [facility]

## 2023-03-23 ENCOUNTER — Encounter: Payer: Self-pay | Admitting: Student

## 2023-03-23 LAB — LIPID PANEL
Chol/HDL Ratio: 3 ratio (ref 0.0–4.4)
Cholesterol, Total: 133 mg/dL (ref 100–199)
HDL: 45 mg/dL (ref >39)
LDL Chol Calc (NIH): 70 mg/dL (ref 0–99)
Triglycerides: 93 mg/dL (ref 0–149)
VLDL Cholesterol Cal: 18 mg/dL (ref 5–40)

## 2023-03-23 LAB — TSH RFX ON ABNORMAL TO FREE T4: TSH: 0.748 u[IU]/mL (ref 0.450–4.500)

## 2023-03-23 NOTE — Assessment & Plan Note (Signed)
Ordered UTD lipid panel

## 2023-03-23 NOTE — Assessment & Plan Note (Signed)
Recheck A1C, gastroparesis on ddx.

## 2023-03-29 ENCOUNTER — Encounter: Payer: Self-pay | Admitting: Student

## 2023-04-12 ENCOUNTER — Encounter: Payer: Self-pay | Admitting: Pediatrics

## 2023-04-18 DIAGNOSIS — Z419 Encounter for procedure for purposes other than remedying health state, unspecified: Secondary | ICD-10-CM | POA: Diagnosis not present

## 2023-05-19 DIAGNOSIS — Z419 Encounter for procedure for purposes other than remedying health state, unspecified: Secondary | ICD-10-CM | POA: Diagnosis not present

## 2023-05-21 ENCOUNTER — Other Ambulatory Visit: Payer: Self-pay | Admitting: Student

## 2023-05-21 DIAGNOSIS — E119 Type 2 diabetes mellitus without complications: Secondary | ICD-10-CM

## 2023-05-30 NOTE — Progress Notes (Signed)
 [facility] Initial Consultation   Referring Provider Bryan Bianchi, MD [address] [place],  [place] [address]  Primary Care Provider Bryan Bianchi, MD  Patient Profile: Tanya Owens is a 63 y.o. female with a past medical history noteworthy for T2DM, RA, mixed hyperlipidemia and obesity who is seen in consultation in the [facility] at the request of Dr. Bryan for evaluation and management of the problem(s) noted below.  Problem List: Right upper quadrant abdominal pain Early satiety Weight loss Constipation Colon cancer screening Hepatic steatosis on RUQ ultrasound   History of Present Illness   Tanya Owens is a 63 y.o. female with a history of T2DM, RA, mixed hyperlipidemia and obesity who presents for evaluation and management of chronic right upper quadrant abdominal pain, early satiety, weight loss, constipation and hepatic steatosis on right upper quadrant ultrasound.  She is accompanied to the office today by Tanya Owens who provides history in conjunction with Tanya.  Chronic RUQ abdominal pain, early satiety, weight loss --Records document > 10 year history of chronic right upper quadrant abdominal pain that would previously come and go --Since 01/2023 pain is constant, sharp in character and waxes and wanes in terms of intensity --Pain is sometimes exacerbated by the ingestion of food --Endorses eructation and early satiety --No nausea or vomiting --Symptoms have contributed to loss of appetite and weight --Weight 1 year ago was 242 pounds and is 228 pounds today --Takes ibuprofen  several times a week for joint pain and abdominal pain; not on PPI -- No history of peptic ulcer disease -- No previous upper endoscopy -- Previous ultrasound showed a 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 measuring up to 3.1 cm -CTAP ordered but not completed  Constipation --Has had lifelong problems with constipation stooling twice a  week --No blood or mucus in stool --Owens relates that diet is devoid of fiber and adequate hydration --She has been managing Tanya constipation consuming sugarless peppermint candies - likely w/sorbitol --Also takes MiraLAX  when she feels constipated --Has never had a colonoscopy --No known family history of colorectal cancer  Colon cancer screening --As above, she has never had colon cancer screening and no family history of colorectal cancer --Reviewed risks versus benefits of colonoscopy and she is amenable to proceeding   Hepatic steatosis on RUQ ultrasound -- No history of abnormal LFTs -01/2023 AST 21, ALT 16  Last colonoscopy: None Last endoscopy: None  Last Abd CT/CTE/MRE: Scheduled  GI Review of Symptoms Significant for right upper quadrant abdominal pain, early satiety, weight loss, constipation. Otherwise negative.  General Review of Systems  Review of systems is significant for the pertinent positives and negatives as listed per the HPI.  Full ROS is otherwise negative.  Past Medical History   Past Medical History:  Diagnosis Date   Arthritis 2000   Diabetes mellitus without complication (HCC) 2005   Hypercholesteremia 2005   Hypertension 2005   Sleep apnea 2010   Sleep disorder      Past Surgical History   Past Surgical History:  Procedure Laterality Date   CARDIAC CATHETERIZATION  10/18/2007   Normal coronaries, systemic hypertension      Allergies and Medications   Allergies  Allergen Reactions   Tylenol  [Acetaminophen ] Other (See Comments)    Stomach cramps     Current Meds  Medication Sig   amLODipine  (NORVASC ) 10 MG tablet TAKE 1 TABLET(10 MG) BY MOUTH DAILY   atorvastatin  (LIPITOR) 80 MG tablet Take 1 tablet (80  mg total) by mouth daily.   Etanercept 50 MG/ML SOCT Inject into the skin.   folic acid  (FOLVITE ) 1 MG tablet Take 1 mg by mouth daily.   gabapentin  (NEURONTIN ) 100 MG capsule TAKE 1 CAPSULE(100 MG) BY MOUTH THREE TIMES DAILY    hydrochlorothiazide  (HYDRODIURIL ) 25 MG tablet Take 1 tablet (25 mg total) by mouth daily.   ketoconazole  (NIZORAL ) 2 % cream APPLY TOPICALLY TO THE AFFECTED AREA TWICE DAILY   lisinopril  (ZESTRIL ) 40 MG tablet Take 1 tablet (40 mg total) by mouth daily.   metFORMIN  (GLUCOPHAGE ) 1000 MG tablet TAKE 1 TABLET(1000 MG) BY MOUTH TWICE DAILY WITH A MEAL   methotrexate  (RHEUMATREX) 2.5 MG tablet TAKE 6 TABLETS(15 MG) BY MOUTH 1 TIME A WEEK   PEG 3350 -KCl-NaCl-NaSulf-MgSul (SUFLAVE ) 178.7 g SOLR Take 1 kit by mouth once for 1 dose.   polyethylene glycol powder (GLYCOLAX /MIRALAX ) 17 GM/SCOOP powder Take 17 g by mouth 2 (two) times daily as needed.     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 Tanya smoking use included cigarettes. She has never used smokeless tobacco. She reports that she does not drink alcohol and does not use drugs.  Vital Signs and Physical Examination   Vitals:   06/01/23 0855 06/01/23 1011  BP: (!) 190/110 (!) 160/100  Pulse: 84    Body mass index is 35.71 kg/m. Weight: 228 lb (103.4 kg)  Constitutional: chronically ill appearing nontoxic appearing 63 y.o. female in no apparent distress.   Eyes: Anicteric sclerae Head: Normocephalic/atraumatic Neck: Normal Pulmonary: Clear to auscultation bilaterally CV: S1, S2, regular rate and rhythm GI/Abdomen: Soft, tender to palpation in the right upper  quadrant and mid abdomen bilaterally, no rebound or guarding, no hepatosplenomegaly Anorectal: Deferred Neuro: Grossly normal Skin: Warm and dry Bilateral Extremities: Ambulates with an antalgic gait   Review of Data  The following data was reviewed at the time of this encounter:  Laboratory Studies   Lab Results  Component Value Date   WBC 8.0 11/23/2022   HGB 11.8 11/23/2022   HCT 37.0 11/23/2022   MCV 85 11/23/2022   PLT 211 11/23/2022     Chemistry      Component Value Date/Time   NA 140 02/05/2023 1447   K 4.3 02/05/2023 1447   CL 100 02/05/2023 1447   CO2 21 02/05/2023 1447   BUN 10 02/05/2023 1447   CREATININE 0.75 02/05/2023 1447   CREATININE 0.68 04/27/2016 1123      Component Value Date/Time   CALCIUM  9.7 02/05/2023 1447   ALKPHOS 105 02/05/2023 1447   AST 21 02/05/2023 1447   ALT 16 02/05/2023 1447   BILITOT 0.8 02/05/2023 1447       Lab Results  Component Value Date   TSH 0.748 03/22/2023  Imaging Studies  RUQ US  02/15/2023 1. Increased hepatic parenchymal echogenicity suggestive of steatosis. 2. 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 Recommend further evaluation with adrenal protocol CT or MRI. 3. No cholelithiasis or sonographic evidence for acute cholecystitis. 4. These results will be called to the ordering clinician or representative by the Radiologist Assistant, and communication documented in the PACS or [employer].  GI Procedures and Studies  None   Clinical Impression  It is my clinical impression that Ms. Willison is a 63 y.o. female with;  Right upper quadrant abdominal pain Early satiety Weight loss Constipation Colon cancer screening Hepatic steatosis on RUQ ultrasound  Ms. Colvin presents with a 10+ year history of right upper quadrant abdominal pain with recent associated early satiety and 20 pound weight loss.  She endorses taking large quantities of ibuprofen  for  management of Tanya arthritis and abdominal pain.  She has also struggled with constipation and is overdue for colon cancer screening. Imaging has been noteworthy for hepatic steatosis.  We discussed that the differential diagnosis for Tanya symptoms may include esophagitis, gastritis, H. pylori infection, peptic ulcer disease, gastrointestinal malignancy, slow transit or obstructive defecation.  I have recommended scheduling Tanya for an upper endoscopy and colonoscopy for further evaluation of Tanya symptoms.  We reviewed the risks and benefits-she is amenable to proceeding.  Liver ultrasound revealed evidence of hepatic steatosis.  Liver enzymes are normal at this time.  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  A follow-up CT was ordered but has not yet been scheduled.  I requested that our office assist Tanya with this today.   Plan  Schedule diagnostic EGD and screening colonoscopy in [facility]. Schedule CT scan of abdomen and pelvis to follow-up adrenal lesion. For constipation we discussed augmenting fiber and fluid in Tanya diet.  Trial of MiraLAX  1/4-1/2 cap daily for management of constipation. If colonoscopy is on remarkable and constipation persist can consider prescription laxative such as Linzess , Motegrity or Trulance . Monitor weight and anthropometrics  Planned Follow Up 2-3 months  The patient or caregiver verbalized understanding of the material covered, with no barriers to understanding. All questions were answered. Patient or caregiver is agreeable with the plan outlined above.    It was a pleasure to see Malayiah.  If you have any questions or concerns regarding this evaluation, do not hesitate to contact me.  Inocente Hausen, MD [facility]   I spent total of 45 minutes in both face-to-face and non-face-to-face activities, excluding procedures performed, for the visit on the date of this encounter.

## 2023-06-01 ENCOUNTER — Ambulatory Visit: Payer: Medicaid Other | Admitting: Pediatrics

## 2023-06-01 ENCOUNTER — Other Ambulatory Visit: Payer: Self-pay

## 2023-06-01 ENCOUNTER — Emergency Department (HOSPITAL_COMMUNITY)
Admission: EM | Admit: 2023-06-01 | Discharge: 2023-06-02 | Discharge status: Against Medical Advice | Payer: Medicaid Other | Attending: Emergency Medicine | Admitting: Emergency Medicine

## 2023-06-01 ENCOUNTER — Emergency Department (HOSPITAL_COMMUNITY): Payer: Medicaid Other

## 2023-06-01 ENCOUNTER — Encounter (HOSPITAL_COMMUNITY): Payer: Self-pay

## 2023-06-01 ENCOUNTER — Ambulatory Visit (INDEPENDENT_AMBULATORY_CARE_PROVIDER_SITE_OTHER): Payer: Medicaid Other | Admitting: Student

## 2023-06-01 ENCOUNTER — Encounter: Payer: Self-pay | Admitting: Pediatrics

## 2023-06-01 ENCOUNTER — Other Ambulatory Visit: Payer: Self-pay | Admitting: Student

## 2023-06-01 VITALS — BP 180/100 | HR 99 | Wt 227.4 lb

## 2023-06-01 VITALS — BP 160/100 | HR 84 | Ht 67.0 in | Wt 228.0 lb

## 2023-06-01 DIAGNOSIS — R1011 Right upper quadrant pain: Secondary | ICD-10-CM | POA: Diagnosis not present

## 2023-06-01 DIAGNOSIS — R6881 Early satiety: Secondary | ICD-10-CM

## 2023-06-01 DIAGNOSIS — K5900 Constipation, unspecified: Secondary | ICD-10-CM

## 2023-06-01 DIAGNOSIS — I10 Essential (primary) hypertension: Secondary | ICD-10-CM | POA: Diagnosis not present

## 2023-06-01 DIAGNOSIS — R519 Headache, unspecified: Secondary | ICD-10-CM | POA: Insufficient documentation

## 2023-06-01 DIAGNOSIS — K760 Fatty (change of) liver, not elsewhere classified: Secondary | ICD-10-CM

## 2023-06-01 DIAGNOSIS — Z5321 Procedure and treatment not carried out due to patient leaving prior to being seen by health care provider: Secondary | ICD-10-CM | POA: Diagnosis not present

## 2023-06-01 DIAGNOSIS — Z1211 Encounter for screening for malignant neoplasm of colon: Secondary | ICD-10-CM

## 2023-06-01 DIAGNOSIS — R634 Abnormal weight loss: Secondary | ICD-10-CM

## 2023-06-01 DIAGNOSIS — G8929 Other chronic pain: Secondary | ICD-10-CM

## 2023-06-01 DIAGNOSIS — R000 Tachycardia, unspecified: Secondary | ICD-10-CM | POA: Diagnosis not present

## 2023-06-01 DIAGNOSIS — H538 Other visual disturbances: Secondary | ICD-10-CM | POA: Diagnosis not present

## 2023-06-01 DIAGNOSIS — R9389 Abnormal findings on diagnostic imaging of other specified body structures: Secondary | ICD-10-CM

## 2023-06-01 DIAGNOSIS — E119 Type 2 diabetes mellitus without complications: Secondary | ICD-10-CM

## 2023-06-01 LAB — URINALYSIS, ROUTINE W REFLEX MICROSCOPIC
Bacteria, UA: NONE SEEN
Bilirubin Urine: NEGATIVE
Glucose, UA: NEGATIVE mg/dL
Hgb urine dipstick: NEGATIVE
Ketones, ur: NEGATIVE mg/dL
Leukocytes,Ua: NEGATIVE
Nitrite: NEGATIVE
Protein, ur: 30 mg/dL — AB
Specific Gravity, Urine: 1.025 (ref 1.005–1.030)
pH: 5 (ref 5.0–8.0)

## 2023-06-01 LAB — COMPREHENSIVE METABOLIC PANEL
ALT: 80 U/L — ABNORMAL HIGH (ref 0–44)
AST: 65 U/L — ABNORMAL HIGH (ref 15–41)
Albumin: 4.1 g/dL (ref 3.5–5.0)
Alkaline Phosphatase: 84 U/L (ref 38–126)
Anion gap: 6 (ref 5–15)
BUN: 9 mg/dL (ref 8–23)
CO2: 27 mmol/L (ref 22–32)
Calcium: 9.2 mg/dL (ref 8.9–10.3)
Chloride: 105 mmol/L (ref 98–111)
Creatinine, Ser: 0.68 mg/dL (ref 0.44–1.00)
GFR, Estimated: >60 mL/min (ref >60)
Glucose, Bld: 89 mg/dL (ref 70–99)
Potassium: 3.3 mmol/L — ABNORMAL LOW (ref 3.5–5.1)
Sodium: 138 mmol/L (ref 135–145)
Total Bilirubin: 0.6 mg/dL (ref 0.0–1.2)
Total Protein: 7.5 g/dL (ref 6.5–8.1)

## 2023-06-01 LAB — TROPONIN I (HIGH SENSITIVITY)
Troponin I (High Sensitivity): 6 ng/L (ref <18)
Troponin I (High Sensitivity): 9 ng/L (ref <18)

## 2023-06-01 LAB — CBC
HCT: 38.3 % (ref 36.0–46.0)
Hemoglobin: 12.9 g/dL (ref 12.0–15.0)
MCH: 28.8 pg (ref 26.0–34.0)
MCHC: 33.7 g/dL (ref 30.0–36.0)
MCV: 85.5 fL (ref 80.0–100.0)
Platelets: 277 10*3/uL (ref 150–400)
RBC: 4.48 MIL/uL (ref 3.87–5.11)
RDW: 17.8 % — ABNORMAL HIGH (ref 11.5–15.5)
WBC: 7.9 10*3/uL (ref 4.0–10.5)
nRBC: 0 % (ref 0.0–0.2)

## 2023-06-01 MED ORDER — AMLODIPINE BESYLATE 5 MG PO TABS
10.0000 mg | ORAL_TABLET | Freq: Once | ORAL | Status: AC
  Administered 2023-06-01: 10 mg via ORAL
  Filled 2023-06-01: qty 2

## 2023-06-01 MED ORDER — SUFLAVE 178.7 G PO SOLR: 1.0000 | Freq: Once | ORAL | 0 refills | Status: AC

## 2023-06-01 NOTE — ED Triage Notes (Signed)
 Pt c/o HTN, intermittent blurred vision, and HA x 2 weeks; hx HTN, endorses compliance with medication

## 2023-06-01 NOTE — Patient Instructions (Addendum)
 It was great seeing you today.  As we discussed, -Worsening over to the emergency department for further evaluation because your blood pressure is dangerously elevated. -I would like you to come back to our clinic within 1 to 2 days after you leave the hospital.   If you have any questions or concerns, please feel free to call the clinic.   Have a wonderful day,  Dr. Barabara Dama [facility] [phone]

## 2023-06-01 NOTE — ED Notes (Signed)
 I let the triage nurse know that the patient is having a really bad headache

## 2023-06-01 NOTE — Progress Notes (Signed)
    SUBJECTIVE:   CHIEF COMPLAINT / HPI:   Tanya Owens is a 63 year-old female here to discuss eczema, but was found to have severely elevated blood pressures and we deferred eczema conversation to a later clinic.  Hypertension Use to be generally well-controlled and near goal in the 130s-140s, but her blood pressure this morning at her GI appointment was 190/110, and this afternoon her pressure is 180/100. Has had vision changes that started about a week and a half ago with blurred vision.  Has also had a severe headache for the past 2 weeks. She denies any chest pain but says that her heart feels like it is racing.  She was dispensed a 5-day quantity of lisinopril  40 mg on 05/30/2023.  However, patient does not know which of her medications that she is or is not taking and why she is taking them.  She did not bring them with her today.   PERTINENT  PMH / PSH:  Type 2 diabetes mellitus Hypertension Rheumatoid arthritis, on methotrexate  Hyperlipidemia  OBJECTIVE:   BP (!) 180/100   Pulse 99   Wt 227 lb 6.4 oz (103.1 kg)   BMI 35.62 kg/m   General: Chronically ill-appearing, non-toxic appearing and in no distress Cardiac: RRR Neuro: A&O. Speech clear and fluent. Gait is normal. Respiratory: normal WOB on RA. No wheezing or crackles on auscultation, good lung sounds throughout Extremities: Moving all 4 extremities equally Skin: Rash on right side thoracic trunk  ASSESSMENT/PLAN:   Severe hypertension Severely elevated blood pressures today with diastolic greater than 100 and systolic greater than 180. She is symptomatic with visual changes and headache, therefore I recommended patient to go directly to the ER from clinic for further evaluation for possible hypertensive emergency. She will need blood work to evaluate for other endorgan damage such as renal impairment, and possibly CT head. Reassuringly, no chest pain at this time. I did educate the patient that blood  pressures this high can cause damage to the eyes, kidneys, lead to CVA or MI and she needs emergent evaluation due to asymptomatic hypertension concerning for hypertensive emergency. I have alerted the inpatient family medicine teaching service that patient will be arriving to the ER.     Marvens Hollars, DO [facility]

## 2023-06-01 NOTE — ED Provider Triage Note (Signed)
 Emergency Medicine Provider Triage Evaluation Note  Tanya Owens , a 63 y.o. female  was evaluated in triage.  Pt complains of HA and intermittent heart beating fast for past two weeks. Is prescribed lisinopril  and hydrochlorothiazide . She states that she takes all her medications but does not know which pills she takes or if she has taken her BP meds. She said she may not have any more BP mediations. Has not taken anything today.  Review of Systems  Positive: HA, blurred vision Negative: CP, SOB  Physical Exam  BP (!) 211/111   Pulse 80   Temp 98.5 F (36.9 C) (Oral)   Resp 18   Wt 99.8 kg   SpO2 100%   BMI 34.46 kg/m  Gen:   Awake, no distress   Resp:  Normal effort  MSK:   Moves extremities without difficulty  Other:    Medical Decision Making  Medically screening exam initiated at 4:34 PM.  Appropriate orders placed.  Tanya Owens was informed that the remainder of the evaluation will be completed by another provider, this initial triage assessment does not replace that evaluation, and the importance of remaining in the ED until their evaluation is complete.  Workup initiated   Tanya Owens 06/01/23 1639

## 2023-06-01 NOTE — ED Notes (Signed)
 Verbal consent given for mse

## 2023-06-01 NOTE — Assessment & Plan Note (Signed)
 Severely elevated blood pressures today with diastolic greater than 100 and systolic greater than 180. She is symptomatic with visual changes and headache, therefore I recommended patient to go directly to the ER from clinic for further evaluation for possible hypertensive emergency. She will need blood work to evaluate for other endorgan damage such as renal impairment, and possibly CT head. Reassuringly, no chest pain at this time. I did educate the patient that blood pressures this high can cause damage to the eyes, kidneys, lead to CVA or MI and she needs emergent evaluation due to asymptomatic hypertension concerning for hypertensive emergency. I have alerted the inpatient family medicine teaching service that patient will be arriving to the ER.

## 2023-06-01 NOTE — Patient Instructions (Addendum)
 You have been scheduled for a CT scan of the ADRENAL ABDOMEN at [facility], 1st floor Radiology. You are scheduled on 06/14/23 at 1:30 pm,. You should arrive 15 minutes prior to your appointment time for registration.    You may take any medications as prescribed with a small amount of water, if necessary. If you take any of the following medications: METFORMIN , GLUCOPHAGE , GLUCOVANCE, AVANDAMET, RIOMET , FORTAMET , ACTOPLUS MET, JANUMET, GLUMETZA  or METAGLIP, you MAY be asked to HOLD this medication 48 hours AFTER the exam.   The purpose of you drinking the oral contrast is to aid in the visualization of your intestinal tract. The contrast solution may cause some diarrhea. Depending on your individual set of symptoms, you may also receive an intravenous injection of x-ray contrast/dye. Plan on being at [facility] for 45 minutes or longer, depending on the type of exam you are having performed.   If you have any questions regarding your exam or if you need to reschedule, you may call [facility] at [phone] between the hours of 8:00 am and 5:00 pm, Monday-Friday.     You have been scheduled for an endoscopy and colonoscopy. Please follow the written instructions given to you at your visit today.  Please pick up your prep supplies at the pharmacy within the next 1-3 days.  If you use inhalers (even only as needed), please bring them with you on the day of your procedure.  DO NOT TAKE 7 DAYS PRIOR TO TEST- Trulicity (dulaglutide) Ozempic, Wegovy (semaglutide) Mounjaro (tirzepatide) Bydureon Bcise (exanatide extended release)  DO NOT TAKE 1 DAY PRIOR TO YOUR TEST Rybelsus (semaglutide) Adlyxin (lixisenatide) Victoza (liraglutide) Byetta (exanatide) ___________________________________________________________________________   Tanya Owens will receive your bowel preparation through [facility], which ensures the lowest copay and home delivery, with outreach via text or call from  an [phone] number. Please respond promptly to avoid rescheduling. If you are interested in alternative options or have any questions please contact them at [phone]  Your Provider Has Sent Your Bowel Prep Regimen To [facility] What to expect. [facility] will contact you to verify your information and collect your copay, if applicable. Enjoy the comfort of your home while we deliver your prescription to you, free of any shipping charges. Fast, FREE delivery or shipping. [facility] accepts all major insurance benefits and applies discounts & coupons  Have additional questions? [facility]'s patient care team is always here to help.  Chat: [email or web address] Call: [phone] Email: [email or web address] NCPDP: [id number] How will we contact you? Welcome Phone call  a Welcome text and a Checkout link in a text Texts you receive from [phone] Are Not Spam.   *To set up delivery, you must complete the checkout process via link or speak to one of our patient care representatives. If we are unable to reach you, your prescription may be delayed.   If your blood pressure at your visit was 140/90 or greater, please contact your primary care physician to follow up on this.  _______________________________________________________  If you are age 91 or older, your body mass index should be between 23-30. Your Body mass index is 35.71 kg/m. If this is out of the aforementioned range listed, please consider follow up with your Primary Care Provider.  If you are age 67 or younger, your body mass index should be between 19-25. Your Body mass index is 35.71 kg/m. If this is out of the aformentioned range listed, please consider follow up with your Primary Care Provider.   ________________________________________________________  The [facility] providers would like to encourage you to use MYCHART to communicate with providers for non-urgent requests or questions.  Due to long hold times on  the telephone, sending your provider a message by [facility] may be a faster and more efficient way to get a response.  Please allow 48 business hours for a response.  Please remember that this is for non-urgent requests.  _______________________________________________________  Thank you for entrusting me with your care and choosing [facility].  Dr Suzann

## 2023-06-02 ENCOUNTER — Other Ambulatory Visit: Payer: Self-pay

## 2023-06-02 ENCOUNTER — Encounter (HOSPITAL_COMMUNITY): Payer: Self-pay | Admitting: Emergency Medicine

## 2023-06-02 ENCOUNTER — Emergency Department (HOSPITAL_COMMUNITY)
Admission: EM | Admit: 2023-06-02 | Discharge: 2023-06-03 | Discharge status: Against Medical Advice | Payer: Medicaid Other | Source: Home / Self Care

## 2023-06-02 DIAGNOSIS — Z5321 Procedure and treatment not carried out due to patient leaving prior to being seen by health care provider: Secondary | ICD-10-CM | POA: Insufficient documentation

## 2023-06-02 DIAGNOSIS — R1011 Right upper quadrant pain: Secondary | ICD-10-CM | POA: Insufficient documentation

## 2023-06-02 DIAGNOSIS — I10 Essential (primary) hypertension: Secondary | ICD-10-CM | POA: Insufficient documentation

## 2023-06-02 DIAGNOSIS — R519 Headache, unspecified: Secondary | ICD-10-CM | POA: Insufficient documentation

## 2023-06-02 DIAGNOSIS — R1084 Generalized abdominal pain: Secondary | ICD-10-CM | POA: Diagnosis not present

## 2023-06-02 DIAGNOSIS — Z743 Need for continuous supervision: Secondary | ICD-10-CM | POA: Diagnosis not present

## 2023-06-02 LAB — CBC
HCT: 43.5 % (ref 36.0–46.0)
Hemoglobin: 14 g/dL (ref 12.0–15.0)
MCH: 28.1 pg (ref 26.0–34.0)
MCHC: 32.2 g/dL (ref 30.0–36.0)
MCV: 87.3 fL (ref 80.0–100.0)
Platelets: 288 10*3/uL (ref 150–400)
RBC: 4.98 MIL/uL (ref 3.87–5.11)
RDW: 20.2 % — ABNORMAL HIGH (ref 11.5–15.5)
WBC: 7.9 10*3/uL (ref 4.0–10.5)
nRBC: 0 % (ref 0.0–0.2)

## 2023-06-02 LAB — URINALYSIS, ROUTINE W REFLEX MICROSCOPIC
Bacteria, UA: NONE SEEN
Bilirubin Urine: NEGATIVE
Glucose, UA: NEGATIVE mg/dL
Hgb urine dipstick: NEGATIVE
Ketones, ur: NEGATIVE mg/dL
Nitrite: NEGATIVE
Protein, ur: 30 mg/dL — AB
Specific Gravity, Urine: 1.019 (ref 1.005–1.030)
pH: 5 (ref 5.0–8.0)

## 2023-06-02 NOTE — ED Triage Notes (Signed)
 Pt BIB EMS from home. Pt C/O headache pain 8-9, RUQ abdominal pain, and hypertension. EMS given 50 fentanyl for headache and brought pain down to 3/10. Patient unsure which pills she is taking are for her blood pressure which is why she was not taking them. She is normally notified when she needs a refill but did not receive notification. Patient developed headache 2 weeks ago. Patient denies N/V/D. Patient recently seen 06/01/23 at [facility] ED but left after labs were drawn. Patient has a scheduled endoscopy and colonoscopy for Thursday for RUQ abd pain.

## 2023-06-02 NOTE — ED Notes (Signed)
 Called x3  for repeat vitals, no answer

## 2023-06-08 ENCOUNTER — Ambulatory Visit
Admission: EM | Admit: 2023-06-08 | Discharge: 2023-06-08 | Disposition: A | Discharge status: Home / Self Care | Payer: Medicaid Other | Attending: Family Medicine | Admitting: Family Medicine

## 2023-06-08 ENCOUNTER — Encounter: Payer: Self-pay | Admitting: Emergency Medicine

## 2023-06-08 ENCOUNTER — Other Ambulatory Visit: Payer: Self-pay

## 2023-06-08 DIAGNOSIS — R519 Headache, unspecified: Secondary | ICD-10-CM

## 2023-06-08 DIAGNOSIS — L309 Dermatitis, unspecified: Secondary | ICD-10-CM

## 2023-06-08 MED ORDER — KETOROLAC TROMETHAMINE 30 MG/ML IJ SOLN
30.0000 mg | Freq: Once | INTRAMUSCULAR | Status: AC
Start: 2023-06-08 — End: 2023-06-08
  Administered 2023-06-08: 30 mg via INTRAMUSCULAR

## 2023-06-08 MED ORDER — CLOBETASOL PROPIONATE 0.05 % EX CREA: 1.0000 | TOPICAL_CREAM | CUTANEOUS | 0 refills | Status: DC | PRN

## 2023-06-08 NOTE — Discharge Instructions (Addendum)
Meds ordered this encounter  Medications   ketorolac (TORADOL) 30 MG/ML injection 30 mg    

## 2023-06-08 NOTE — ED Provider Notes (Signed)
[facility]   [id number] 06/08/23 Arrival Time: 0803  ASSESSMENT & PLAN:  1. Bad headache   2. Eczema, unspecified type    Meds ordered this encounter  Medications   ketorolac (TORADOL) 30 MG/ML injection 30 mg   clobetasol cream (TEMOVATE) 0.05 %    Sig: Apply 1 Application topically as needed.    Dispense:  45 g    Refill:  0   Without neurological abnormality. Afebrile without nuchal rigidity.  Trial of Toradol. She agrees to ED eval should this not help HA. Refilled eczema cream.  Recommend:  Follow-up Information     [facility]   Specialty: Emergency Medicine Why: If symptoms worsen in any way. Contact information: [address] [place] [address] [phone]        Alfredo Martinez, MD.   Specialty: Family Medicine Why: For a follow up. Contact information: [address] [place] [address] [phone]                  Reviewed expectations re: course of current medical issues. Questions answered. Outlined signs and symptoms indicating need for more acute intervention. Patient verbalized understanding. After Visit Summary given.   SUBJECTIVE: History from: Patient. Patient is able to give a clear and coherent history.  Tanya Owens is a 63 y.o. female who presents with complaint of a bad headache. Onset gradual, approx 2-3 weeks ago. Has been to ED but LWBS. Location:  superior but generalized at times  without radiation. History of headaches: intermittent; this one lasting longer than usu. Precipitating factors include: none which have been determined. Associated symptoms: Preceding aura: no. Nausea/vomiting: no. Vision changes: sometimes but attributes this to DM. Increased sensitivity to light and to noises: no. Fever: no. Sinus pressure/congestion: no. Extremity weakness: no. No HA tx PTA. Denies dizziness, loss of balance, muscle weakness, numbness  of extremities, speech difficulties. Denies head injury. Ambulatory without difficulty. No recent travel.  Also request Rx for eczema exacerbation; mainly R abdomen.   OBJECTIVE:  Vitals:   06/08/23 0821  BP: 134/86  Pulse: (!) 102  Resp: 18  Temp: 98.8 F (37.1 C)  TempSrc: Oral  SpO2: 98%    Recheck P: 92 General appearance: alert; NAD HENT: normocephalic; atraumatic Eyes: PERRLA; EOMI; conjunctivae normal Neck: supple with FROM Lungs: clear to auscultation bilaterally; unlabored respirations Heart: regular rate and rhythm Extremities: no edema; symmetrical with no gross deformities Skin: warm and dry; inflamed, irritated skin over R abdomen consistent with eczema Neurologic: alert; speech is fluent and clear without dysarthria or aphasia; CN 2-12 grossly intact; no facial droop; normal gait; normal symmetric reflexes; normal extremity strength and sensation throughout; bilateral upper and lower extremity sensation is grossly intact with 5/5 symmetric strength; normal grip strength; normal plantar and dorsi flexion bilaterally; normal finger to nose bilaterally; negative pronator drift; negative Romberg sign Psychological: alert and cooperative; normal mood and affect   Allergies  Allergen Reactions   Tylenol [Acetaminophen] Other (See Comments)    Stomach cramps     Past Medical History:  Diagnosis Date   Arthritis 2000   Diabetes mellitus without complication (HCC) 2005   Hypercholesteremia 2005   Hypertension 2005   Sleep apnea 2010   Sleep disorder    Social History   Socioeconomic History   Marital status: Single    Spouse name: Not on file   Number of children: 5   Years of education: Not on  file   Highest education level: Not on file  Occupational History   Not on file  Tobacco Use   Smoking status: Former    Current packs/day: 0.00    Types: Cigarettes    Quit date: 12/27/2012    Years since quitting: 10.4   Smokeless tobacco: Never  Vaping Use    Vaping status: Never Used  Substance and Sexual Activity   Alcohol use: No   Drug use: No   Sexual activity: Yes    Birth control/protection: Post-menopausal  Other Topics Concern   Not on file  Social History Narrative   Ms. 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   Social Drivers of Corporate investment banker Strain: Not on file  Food Insecurity: Not on file  Transportation Needs: Not on file  Physical Activity: Not on file  Stress: Not on file  Social Connections: Not on file  Intimate Partner Violence: Not on file   Family History  Problem Relation Age of Onset   Heart attack Mother    Diabetes Mother    Hypercholesterolemia Mother    Hypertension Mother    Diabetes Sister    Hypertension Sister    Hypertension Brother    Hypertension Brother    Alzheimer's disease Maternal Grandmother    Past Surgical History:  Procedure Laterality Date   CARDIAC CATHETERIZATION  10/18/2007   Normal coronaries, systemic hypertension      Mardella Layman, MD 06/08/23 [phone]

## 2023-06-08 NOTE — ED Triage Notes (Signed)
Pt here for HA x 3 weeks; pt went to ED x 2 but left; pt sts thought it was her BP; pt sts also feels achy and some congestion

## 2023-06-10 ENCOUNTER — Encounter: Payer: Medicaid Other | Admitting: Pediatrics

## 2023-06-10 ENCOUNTER — Encounter: Payer: Self-pay | Admitting: Family Medicine

## 2023-06-10 ENCOUNTER — Other Ambulatory Visit: Payer: Self-pay | Admitting: Family Medicine

## 2023-06-10 ENCOUNTER — Telehealth: Payer: Self-pay

## 2023-06-10 DIAGNOSIS — E278 Other specified disorders of adrenal gland: Secondary | ICD-10-CM

## 2023-06-10 NOTE — Telephone Encounter (Signed)
-----   Message from [facility] [name] S sent at 06/10/2023  2:53 PM EST ----- Please call [facility] and schedule CT and call pt to confirm appt.  Tanya Owens

## 2023-06-10 NOTE — Telephone Encounter (Signed)
Called [facility] and spoke with Pansy to schedule a CT Scan appointment for patient. Scheduler provided me some information to inform the patient.  Patient must pick up 2 bottles of contrast at [address], same location where the CT appointment will be at. If patient needs to reschedule to call the number [phone] If patient doesn't show it will be a $75 no show fee.  Called patient to inform her about her upcoming appointment on February 14th at 2:20pm. Check in time will be at 2pm at [facility] ([address])  Relayed all the information that Pansy provided to me.  Patient understood information provided.  Drusilla Kanner, CMA

## 2023-06-14 ENCOUNTER — Ambulatory Visit (HOSPITAL_COMMUNITY): Payer: Medicaid Other

## 2023-06-18 ENCOUNTER — Emergency Department (HOSPITAL_COMMUNITY)
Admission: EM | Admit: 2023-06-18 | Discharge: 2023-06-18 | Disposition: A | Discharge status: Home / Self Care | Payer: Medicaid Other | Attending: Emergency Medicine | Admitting: Emergency Medicine

## 2023-06-18 ENCOUNTER — Emergency Department (HOSPITAL_COMMUNITY): Payer: Medicaid Other

## 2023-06-18 ENCOUNTER — Encounter: Payer: Self-pay | Admitting: Physician Assistant

## 2023-06-18 ENCOUNTER — Ambulatory Visit
Admission: EM | Admit: 2023-06-18 | Discharge: 2023-06-18 | Disposition: A | Discharge status: Home / Self Care | Payer: Medicaid Other

## 2023-06-18 ENCOUNTER — Other Ambulatory Visit: Payer: Self-pay

## 2023-06-18 DIAGNOSIS — E119 Type 2 diabetes mellitus without complications: Secondary | ICD-10-CM | POA: Diagnosis not present

## 2023-06-18 DIAGNOSIS — E278 Other specified disorders of adrenal gland: Secondary | ICD-10-CM | POA: Insufficient documentation

## 2023-06-18 DIAGNOSIS — Z20822 Contact with and (suspected) exposure to covid-19: Secondary | ICD-10-CM | POA: Diagnosis not present

## 2023-06-18 DIAGNOSIS — R0602 Shortness of breath: Secondary | ICD-10-CM | POA: Diagnosis not present

## 2023-06-18 DIAGNOSIS — J019 Acute sinusitis, unspecified: Secondary | ICD-10-CM | POA: Insufficient documentation

## 2023-06-18 DIAGNOSIS — R002 Palpitations: Secondary | ICD-10-CM | POA: Diagnosis not present

## 2023-06-18 DIAGNOSIS — I10 Essential (primary) hypertension: Secondary | ICD-10-CM | POA: Insufficient documentation

## 2023-06-18 DIAGNOSIS — R079 Chest pain, unspecified: Secondary | ICD-10-CM

## 2023-06-18 DIAGNOSIS — E279 Disorder of adrenal gland, unspecified: Secondary | ICD-10-CM

## 2023-06-18 DIAGNOSIS — R1900 Intra-abdominal and pelvic swelling, mass and lump, unspecified site: Secondary | ICD-10-CM | POA: Diagnosis not present

## 2023-06-18 DIAGNOSIS — R5383 Other fatigue: Secondary | ICD-10-CM

## 2023-06-18 DIAGNOSIS — J40 Bronchitis, not specified as acute or chronic: Secondary | ICD-10-CM | POA: Diagnosis not present

## 2023-06-18 DIAGNOSIS — R0789 Other chest pain: Secondary | ICD-10-CM | POA: Diagnosis not present

## 2023-06-18 DIAGNOSIS — R1011 Right upper quadrant pain: Secondary | ICD-10-CM | POA: Insufficient documentation

## 2023-06-18 DIAGNOSIS — R0609 Other forms of dyspnea: Secondary | ICD-10-CM

## 2023-06-18 DIAGNOSIS — R519 Headache, unspecified: Secondary | ICD-10-CM | POA: Diagnosis not present

## 2023-06-18 LAB — TSH: TSH: 0.717 u[IU]/mL (ref 0.350–4.500)

## 2023-06-18 LAB — COMPREHENSIVE METABOLIC PANEL
ALT: 34 U/L (ref 0–44)
AST: 35 U/L (ref 15–41)
Albumin: 3.5 g/dL (ref 3.5–5.0)
Alkaline Phosphatase: 83 U/L (ref 38–126)
Anion gap: 12 (ref 5–15)
BUN: 7 mg/dL — ABNORMAL LOW (ref 8–23)
CO2: 24 mmol/L (ref 22–32)
Calcium: 9.7 mg/dL (ref 8.9–10.3)
Chloride: 100 mmol/L (ref 98–111)
Creatinine, Ser: 0.66 mg/dL (ref 0.44–1.00)
GFR, Estimated: >60 mL/min (ref >60)
Glucose, Bld: 106 mg/dL — ABNORMAL HIGH (ref 70–99)
Potassium: 3.2 mmol/L — ABNORMAL LOW (ref 3.5–5.1)
Sodium: 136 mmol/L (ref 135–145)
Total Bilirubin: 0.6 mg/dL (ref 0.0–1.2)
Total Protein: 7.9 g/dL (ref 6.5–8.1)

## 2023-06-18 LAB — RESP PANEL BY RT-PCR (RSV, FLU A&B, COVID)  RVPGX2
Influenza A by PCR: NEGATIVE
Influenza B by PCR: NEGATIVE
Resp Syncytial Virus by PCR: NEGATIVE
SARS Coronavirus 2 by RT PCR: NEGATIVE

## 2023-06-18 LAB — CBC WITH DIFFERENTIAL/PLATELET
Abs Immature Granulocytes: 0.07 10*3/uL (ref 0.00–0.07)
Basophils Absolute: 0 10*3/uL (ref 0.0–0.1)
Basophils Relative: 0 %
Eosinophils Absolute: 0.2 10*3/uL (ref 0.0–0.5)
Eosinophils Relative: 2 %
HCT: 38.7 % (ref 36.0–46.0)
Hemoglobin: 13.2 g/dL (ref 12.0–15.0)
Immature Granulocytes: 1 %
Lymphocytes Relative: 28 %
Lymphs Abs: 3.2 10*3/uL (ref 0.7–4.0)
MCH: 28.6 pg (ref 26.0–34.0)
MCHC: 34.1 g/dL (ref 30.0–36.0)
MCV: 83.9 fL (ref 80.0–100.0)
Monocytes Absolute: 0.6 10*3/uL (ref 0.1–1.0)
Monocytes Relative: 5 %
Neutro Abs: 7.3 10*3/uL (ref 1.7–7.7)
Neutrophils Relative %: 64 %
Platelets: 416 10*3/uL — ABNORMAL HIGH (ref 150–400)
RBC: 4.61 MIL/uL (ref 3.87–5.11)
RDW: 17.6 % — ABNORMAL HIGH (ref 11.5–15.5)
WBC: 11.4 10*3/uL — ABNORMAL HIGH (ref 4.0–10.5)
nRBC: 0 % (ref 0.0–0.2)

## 2023-06-18 LAB — LIPASE, BLOOD: Lipase: 26 U/L (ref 11–51)

## 2023-06-18 LAB — MAGNESIUM: Magnesium: 1.5 mg/dL — ABNORMAL LOW (ref 1.7–2.4)

## 2023-06-18 LAB — URINALYSIS, W/ REFLEX TO CULTURE (INFECTION SUSPECTED)
Bilirubin Urine: NEGATIVE
Glucose, UA: NEGATIVE mg/dL
Hgb urine dipstick: NEGATIVE
Ketones, ur: NEGATIVE mg/dL
Nitrite: NEGATIVE
Protein, ur: NEGATIVE mg/dL
Specific Gravity, Urine: 1.006 (ref 1.005–1.030)
pH: 7 (ref 5.0–8.0)

## 2023-06-18 LAB — LACTIC ACID, PLASMA
Lactic Acid, Venous: 1.5 mmol/L (ref 0.5–1.9)
Lactic Acid, Venous: 1.6 mmol/L (ref 0.5–1.9)

## 2023-06-18 LAB — TROPONIN I (HIGH SENSITIVITY)
Troponin I (High Sensitivity): 4 ng/L (ref <18)
Troponin I (High Sensitivity): 5 ng/L (ref <18)

## 2023-06-18 LAB — BRAIN NATRIURETIC PEPTIDE: B Natriuretic Peptide: 31.1 pg/mL (ref 0.0–100.0)

## 2023-06-18 MED ORDER — PROCHLORPERAZINE EDISYLATE 10 MG/2ML IJ SOLN: 10.0000 mg | Freq: Once | INTRAMUSCULAR | Status: DC

## 2023-06-18 MED ORDER — IOHEXOL 350 MG/ML SOLN
150.0000 mL | Freq: Once | INTRAVENOUS | Status: AC | PRN
  Administered 2023-06-18: 150 mL via INTRAVENOUS

## 2023-06-18 MED ORDER — AMOXICILLIN-POT CLAVULANATE 875-125 MG PO TABS: 1.0000 | ORAL_TABLET | Freq: Two times a day (BID) | ORAL | 0 refills | Status: AC

## 2023-06-18 MED ORDER — DIPHENHYDRAMINE HCL 50 MG/ML IJ SOLN: 50.0000 mg | Freq: Once | INTRAMUSCULAR | Status: DC

## 2023-06-18 NOTE — ED Triage Notes (Signed)
Pt. Stated, Ive had congestion , headache and Ive been to 2 ER's for the same symptoms. Whenever I do something Im SOB.

## 2023-06-18 NOTE — ED Provider Notes (Signed)
EMERGENCY DEPARTMENT AT [facility] Provider Note   CSN: [id number] Arrival date & time: 06/18/23  9528     History {Add pertinent medical, surgical, social history, OB history to HPI:1} Chief Complaint  Patient presents with   Nasal Congestion   Headache   Cough   Shortness of Breath    MARYBELL ROBARDS is a 63 y.o. female.  HPI     2 weeks of congestion, cough, genralized weakness, dyspnea on exertion  Headache for over month, frontal headache, pounding, and top of head. Sometimes wakes up and has pain radiating down. Once take something it goes away but then will come back.  Will ease up and come back. Takes bc powder for it.  If more severe takes tylenol but doesn't like how it makes her feel and has to lay down.  Sometimes takes ibuprofen.  No fevers.  No nausea, no vomiting no diarrhea. Just headaches .  Not worse with bright lights or loud sounds.  When arrived here had bad HA,  and bp was ok .  Denies numbness, weakness, difficulty talking or walking, visual changes or facial droop.    RUQ abdominal pain for years, coming and going.  Can eat a few bites and then feels full. Might bother her to eat something. Feels full. Weightloss. Has talked to dr about it.  Has been worse lately but not having it today  Has some cp that comes and goes, feels it laying down sometimes and better sitting up. Not exertional. No cp today.   Over 2 weeks of congestion and now green  Past Surgical History:  Procedure Laterality Date   CARDIAC CATHETERIZATION  10/18/2007   Normal coronaries, systemic hypertension     Home Medications Prior to Admission medications   Medication Sig Start Date End Date Taking? Authorizing Provider  amLODipine (NORVASC) 10 MG tablet TAKE 1 TABLET(10 MG) BY MOUTH DAILY 06/01/23   Alfredo Martinez, MD  atorvastatin (LIPITOR) 80 MG tablet TAKE 1 TABLET(80 MG) BY MOUTH DAILY 06/01/23   Alfredo Martinez, MD  clobetasol cream (TEMOVATE) 0.05 % Apply  1 Application topically as needed. 06/08/23   Mardella Layman, MD  ENBREL SURECLICK 50 MG/ML injection Inject into the skin.    [provider]  Etanercept 50 MG/ML SOCT Inject into the skin. 09/20/17   [provider]  folic acid (FOLVITE) 1 MG tablet Take 1 mg by mouth daily. 06/04/22   [provider]  gabapentin (NEURONTIN) 100 MG capsule TAKE 1 CAPSULE(100 MG) BY MOUTH THREE TIMES DAILY 03/15/23   Alfredo Martinez, MD  hydrochlorothiazide (HYDRODIURIL) 25 MG tablet Take 1 tablet (25 mg total) by mouth daily. 11/23/22   Alfredo Martinez, MD  ketoconazole (NIZORAL) 2 % cream APPLY TOPICALLY TO THE AFFECTED AREA TWICE DAILY 06/27/22   Alfredo Martinez, MD  lisinopril (ZESTRIL) 40 MG tablet Take 1 tablet (40 mg total) by mouth daily. 11/23/22   Alfredo Martinez, MD  metFORMIN (GLUCOPHAGE) 1000 MG tablet TAKE 1 TABLET(1000 MG) BY MOUTH TWICE DAILY WITH A MEAL 05/22/23   Alfredo Martinez, MD  methotrexate (RHEUMATREX) 2.5 MG tablet TAKE 6 TABLETS(15 MG) BY MOUTH 1 TIME A WEEK 04/13/23   [provider]  polyethylene glycol powder (GLYCOLAX/MIRALAX) 17 GM/SCOOP powder Take 17 g by mouth 2 (two) times daily as needed. Patient not taking: Reported on 06/18/2023 07/21/21   Jackelyn Poling, DO  SUFLAVE 178.7 g SOLR Take by mouth as directed. Patient not taking: Reported on 06/18/2023 06/07/23  [provider]      Allergies    Tylenol [acetaminophen]    Review of Systems   Review of Systems  Physical Exam Updated Vital Signs BP (!) 134/95 (BP Location: Right Arm)   Pulse 95   Temp 98.5 F (36.9 C) (Oral)   Resp 18   SpO2 99%  Physical Exam  ED Results / Procedures / Treatments   Labs (all labs ordered are listed, but only abnormal results are displayed) Labs Reviewed  CBC WITH DIFFERENTIAL/PLATELET - Abnormal; Notable for the following components:      Result Value   WBC 11.4 (*)    RDW 17.6 (*)    Platelets 416 (*)    All other components within normal limits   COMPREHENSIVE METABOLIC PANEL - Abnormal; Notable for the following components:   Potassium 3.2 (*)    Glucose, Bld 106 (*)    BUN 7 (*)    All other components within normal limits  URINALYSIS, W/ REFLEX TO CULTURE (INFECTION SUSPECTED) - Abnormal; Notable for the following components:   APPearance HAZY (*)    Leukocytes,Ua MODERATE (*)    Bacteria, UA RARE (*)    All other components within normal limits  MAGNESIUM - Abnormal; Notable for the following components:   Magnesium 1.5 (*)    All other components within normal limits  RESP PANEL BY RT-PCR (RSV, FLU A&B, COVID)  RVPGX2  LIPASE, BLOOD  LACTIC ACID, PLASMA  LACTIC ACID, PLASMA  BRAIN NATRIURETIC PEPTIDE  TSH  TROPONIN I (HIGH SENSITIVITY)  TROPONIN I (HIGH SENSITIVITY)    EKG EKG Interpretation Date/Time:  Friday June 18 2023 10:37:15 EST Ventricular Rate:  103 PR Interval:  168 QRS Duration:  102 QT Interval:  354 QTC Calculation: 463 R Axis:   17  Text Interpretation: Sinus tachycardia Cannot rule out Anterior infarct , age undetermined ST & T wave abnormality, consider inferolateral ischemia Abnormal ECG When compared with ECG of 02-Jun-2023 17:35, PREVIOUS ECG IS PRESENT when compraed to prior, faster rate no STEMI Confirmed by Theda Belfast ([id number]) on 06/18/2023 10:56:24 AM  Radiology CT Angio Chest PE W and/or Wo Contrast Result Date: 06/18/2023 CLINICAL DATA:  Pulmonary embolus suspected with high probability. Recent discovery of abdominal mass. Severe exertional shortness of breath, headache, palpitations, and tachycardia. Acute abdominal pain. Right upper quadrant pain. Possible mass on adrenal gland on previous ultrasound. Fatigue and malaise. EXAM: CT ANGIOGRAPHY CHEST CT ABDOMEN AND PELVIS WITH CONTRAST TECHNIQUE: Multidetector CT imaging of the chest was performed using the standard protocol during bolus administration of intravenous contrast. Multiplanar CT image reconstructions and MIPs were obtained  to evaluate the vascular anatomy. Multidetector CT imaging of the abdomen and pelvis was performed using the standard protocol during bolus administration of intravenous contrast. RADIATION DOSE REDUCTION: This exam was performed according to the departmental dose-optimization program which includes automated exposure control, adjustment of the mA and/or kV according to patient size and/or use of iterative reconstruction technique. CONTRAST:  OMNIPAQUE IOHEXOL 350 MG/ML SOLN COMPARISON:  Chest radiograph 06/01/2023. Ultrasound abdomen 02/15/2023 FINDINGS: CTA CHEST FINDINGS Cardiovascular: Suboptimal contrast bolus limits the examination of the pulmonary arteries. No large central pulmonary emboli are demonstrated but distal segmental emboli could be obscured due to technique. Normal heart size. No pericardial effusions. Normal caliber thoracic aorta. Calcification of the aorta. Mediastinum/Nodes: Esophagus is decompressed. No significant lymphadenopathy. Thyroid gland is unremarkable. Lungs/Pleura: Mild dependent atelectasis in the lung bases. Lungs are otherwise clear and expanded. There is evidence  of bronchial wall thickening with mucous plugging or secretions in right lower lobe bronchi. No pleural effusion or pneumothorax. Musculoskeletal: No chest wall abnormality. No acute or significant osseous findings. Review of the MIP images confirms the above findings. CT ABDOMEN and PELVIS FINDINGS Hepatobiliary: No focal liver abnormality is seen. No gallstones, gallbladder wall thickening, or biliary dilatation. Pancreas: Unremarkable. No pancreatic ductal dilatation or surrounding inflammatory changes. Spleen: Normal in size without focal abnormality. Adrenals/Urinary Tract: 2 right adrenal gland nodules, largest measuring 3.1 cm diameter with density measurements of 18 Hounsfield units on the chest CT and 28 Hounsfield units on the abdominal CT. Density measures 23 Hounsfield units on the delayed abdominal  CT images. The second right adrenal gland nodule measures 2.1 cm diameter with a density of 57 Hounsfield units on the enhanced image. These correspond to the lesion seen on prior ultrasound with similar size measurements. Lesions are technically indeterminate by image criteria and follow-up in 1 year is suggested. Kidneys are symmetrical and homogeneous. No hydronephrosis or hydroureter. Bladder is normal. Stomach/Bowel: Stomach, small bowel, and colon are not abnormally distended. Stool throughout the colon. No wall thickening or inflammatory changes. Appendix is normal. Vascular/Lymphatic: Aortic atherosclerosis. No enlarged abdominal or pelvic lymph nodes. Reproductive: Uterus and bilateral adnexa are unremarkable. Other: No abdominal wall hernia or abnormality. No abdominopelvic ascites. Musculoskeletal: No acute or significant osseous findings. Review of the MIP images confirms the above findings. IMPRESSION: 1. Suboptimal contrast bolus limits evaluation of the pulmonary arteries but no large central pulmonary emboli are visualized. 2. Bronchitic changes demonstrated in the right lower lung. No focal lung consolidations. 3. Right adrenal gland nodules as detailed above are indeterminate. Follow-up examination in 1 year suggested. 4. Aortic atherosclerosis. Electronically Signed   By: Burman Nieves M.D.   On: 06/18/2023 19:44   CT ABDOMEN PELVIS W CONTRAST Result Date: 06/18/2023 CLINICAL DATA:  Pulmonary embolus suspected with high probability. Recent discovery of abdominal mass. Severe exertional shortness of breath, headache, palpitations, and tachycardia. Acute abdominal pain. Right upper quadrant pain. Possible mass on adrenal gland on previous ultrasound. Fatigue and malaise. EXAM: CT ANGIOGRAPHY CHEST CT ABDOMEN AND PELVIS WITH CONTRAST TECHNIQUE: Multidetector CT imaging of the chest was performed using the standard protocol during bolus administration of intravenous contrast. Multiplanar CT  image reconstructions and MIPs were obtained to evaluate the vascular anatomy. Multidetector CT imaging of the abdomen and pelvis was performed using the standard protocol during bolus administration of intravenous contrast. RADIATION DOSE REDUCTION: This exam was performed according to the departmental dose-optimization program which includes automated exposure control, adjustment of the mA and/or kV according to patient size and/or use of iterative reconstruction technique. CONTRAST:  OMNIPAQUE IOHEXOL 350 MG/ML SOLN COMPARISON:  Chest radiograph 06/01/2023. Ultrasound abdomen 02/15/2023 FINDINGS: CTA CHEST FINDINGS Cardiovascular: Suboptimal contrast bolus limits the examination of the pulmonary arteries. No large central pulmonary emboli are demonstrated but distal segmental emboli could be obscured due to technique. Normal heart size. No pericardial effusions. Normal caliber thoracic aorta. Calcification of the aorta. Mediastinum/Nodes: Esophagus is decompressed. No significant lymphadenopathy. Thyroid gland is unremarkable. Lungs/Pleura: Mild dependent atelectasis in the lung bases. Lungs are otherwise clear and expanded. There is evidence of bronchial wall thickening with mucous plugging or secretions in right lower lobe bronchi. No pleural effusion or pneumothorax. Musculoskeletal: No chest wall abnormality. No acute or significant osseous findings. Review of the MIP images confirms the above findings. CT ABDOMEN and PELVIS FINDINGS Hepatobiliary: No focal liver abnormality is seen. No  gallstones, gallbladder wall thickening, or biliary dilatation. Pancreas: Unremarkable. No pancreatic ductal dilatation or surrounding inflammatory changes. Spleen: Normal in size without focal abnormality. Adrenals/Urinary Tract: 2 right adrenal gland nodules, largest measuring 3.1 cm diameter with density measurements of 18 Hounsfield units on the chest CT and 28 Hounsfield units on the abdominal CT. Density measures  23 Hounsfield units on the delayed abdominal CT images. The second right adrenal gland nodule measures 2.1 cm diameter with a density of 57 Hounsfield units on the enhanced image. These correspond to the lesion seen on prior ultrasound with similar size measurements. Lesions are technically indeterminate by image criteria and follow-up in 1 year is suggested. Kidneys are symmetrical and homogeneous. No hydronephrosis or hydroureter. Bladder is normal. Stomach/Bowel: Stomach, small bowel, and colon are not abnormally distended. Stool throughout the colon. No wall thickening or inflammatory changes. Appendix is normal. Vascular/Lymphatic: Aortic atherosclerosis. No enlarged abdominal or pelvic lymph nodes. Reproductive: Uterus and bilateral adnexa are unremarkable. Other: No abdominal wall hernia or abnormality. No abdominopelvic ascites. Musculoskeletal: No acute or significant osseous findings. Review of the MIP images confirms the above findings. IMPRESSION: 1. Suboptimal contrast bolus limits evaluation of the pulmonary arteries but no large central pulmonary emboli are visualized. 2. Bronchitic changes demonstrated in the right lower lung. No focal lung consolidations. 3. Right adrenal gland nodules as detailed above are indeterminate. Follow-up examination in 1 year suggested. 4. Aortic atherosclerosis. Electronically Signed   By: Burman Nieves M.D.   On: 06/18/2023 19:44   CT VENOGRAM HEAD Result Date: 06/18/2023 CLINICAL DATA:  Dural venous sinus thrombosis suspected Atypical headache lasting 2 weeks that is extremely severe. Possible new mass in abdomen as well. EXAM: CT VENOGRAM HEAD TECHNIQUE: Venographic phase images of the brain were obtained following the administration of intravenous contrast. Multiplanar reformats and maximum intensity projections were generated. RADIATION DOSE REDUCTION: This exam was performed according to the departmental dose-optimization program which includes automated  exposure control, adjustment of the mA and/or kV according to patient size and/or use of iterative reconstruction technique. CONTRAST:  OMNIPAQUE IOHEXOL 350 MG/ML SOLN COMPARISON:  Same day CT head. FINDINGS: No evidence of dural venous sinus thrombosis. Specifically, patent superior sagittal, transverse, sigmoid and straight sinuses. Visualized deep cerebral veins are patent. Symmetric opacification of the cavernous sinuses. IMPRESSION: No evidence of dural venous sinus thrombosis. Electronically Signed   By: Feliberto Harts M.D.   On: 06/18/2023 19:36   CT HEAD WO CONTRAST ( ) Result Date: 06/18/2023 CLINICAL DATA:  Sudden severe headaches. EXAM: CT HEAD WITHOUT CONTRAST TECHNIQUE: Contiguous axial images were obtained from the base of the skull through the vertex without intravenous contrast. RADIATION DOSE REDUCTION: This exam was performed according to the departmental dose-optimization program which includes automated exposure control, adjustment of the mA and/or kV according to patient size and/or use of iterative reconstruction technique. COMPARISON:  05/24/2013 FINDINGS: Brain: No evidence of acute infarction, hemorrhage, hydrocephalus, extra-axial collection or mass lesion/mass effect. Mild cerebral atrophy. Vascular: No hyperdense vessel or unexpected calcification. Skull: Normal. Negative for fracture or focal lesion. Sinuses/Orbits: Opacification of the left maxillary antrum, right sphenoid sinus, and multiple ethmoid air cells. This is likely inflammatory. Mastoid air cells are clear. Other: None. IMPRESSION: 1. No acute intracranial abnormalities.  Mild cerebral atrophy. 2. Opacification of multiple paranasal sinuses, likely inflammatory. Electronically Signed   By: Burman Nieves M.D.   On: 06/18/2023 19:33    Procedures Procedures  {Document cardiac monitor, telemetry assessment procedure when appropriate:1}  Medications  Ordered in ED Medications  prochlorperazine  (COMPAZINE) injection 10 mg (has no administration in time range)  diphenhydrAMINE (BENADRYL) injection 50 mg (has no administration in time range)  iohexol (OMNIPAQUE) 350 MG/ML injection 150 mL (150 mLs Intravenous Contrast Given 06/18/23 1851)    ED Course/ Medical Decision Making/ A&P   {   Click here for ABCD2, HEART and other calculatorsREFRESH Note before signing :1}                              Medical Decision Making  ***  {Document critical care time when appropriate:1} {Document review of labs and clinical decision tools ie heart score, Chads2Vasc2 etc:1}  {Document your independent review of radiology images, and any outside records:1} {Document your discussion with family members, caretakers, and with consultants:1} {Document social determinants of health affecting pt's care:1} {Document your decision making why or why not admission, treatments were needed:1} Final Clinical Impression(s) / ED Diagnoses Final diagnoses:  None    Rx / DC Orders ED Discharge Orders     None

## 2023-06-18 NOTE — ED Provider Notes (Signed)
Patient presents today for evaluation of headache that she has had for the last month.  She reports that she does have significant head and nasal congestion as well as sinus pressure.  She has been seen for this and did have an injection or follow-up with them.  She also notes that she has had elevated blood pressure, palpitations and now dyspnea on exertion.  She reports over the last 2 weeks symptoms have worsened.  This is not typical for her and she states that her regular activities like mopping have now caused her to feel winded.  Given progressively worsening symptoms, age and comorbidities recommended further evaluation in the emergency room for more comprehensive workup.  Patient is agreeable to same and will have family member transport via POV.   Tomi Bamberger, PA-C 06/18/23 [phone]

## 2023-06-18 NOTE — ED Triage Notes (Signed)
Pt reports consistent headache x1 month accompanied by head/nasal congestion and sinus pressure. Pt has been seen at the ED x2 for symptoms this month. Pt notes elevated blood pressures with systolic in 200s some days during headache. Notes new dyspnea on exertion over the last 2 weeks, intermittent. States her regular activities like mopping have her winded now. Denies OTC cold meds use.

## 2023-06-18 NOTE — ED Provider Triage Note (Signed)
Emergency Medicine Provider Triage Evaluation Note  Tanya Owens , a 63 y.o. female  was evaluated in triage.  Pt complains of multiple complaints including malaise, fatigue, exertional shortness of breath with no energy, intermittent right upper quadrant abdominal pain, recent mass seen on ultrasound of the abdomen, new and atypical headache lasting the last 2 weeks, chest tightness, and palpitations.  Review of Systems  Positive: Chest tightness, palpitations, exertional shortness of breath, malaise, fatigue, headache, intermittent abdominal pain Negative: Constipation, diarrhea, urinary changes, new edema, leg pain, or leg swelling worse than baseline  Physical Exam  BP (!) 152/84 (BP Location: Right Arm)   Pulse 95   Resp 18   SpO2 99%  Gen:   Awake, no distress , pupils symmetric and reactive with normal extract movements.  Intact sensation and strength and pulses in extremities. Resp:  Normal effort , clear breath sounds bilaterally MSK:   Moves extremities without difficulty compression stockings in place with minimal edema.  Good pulse in extremities.  Intact sensation strength and pulses in all extremities,  Other:  Patient uncomfortable with this severe headache.  Medical Decision Making  Medically screening exam initiated at 10:11 AM.  Appropriate orders placed.  Tanya Owens was informed that the remainder of the evaluation will be completed by another provider, this initial triage assessment does not replace that evaluation, and the importance of remaining in the ED until their evaluation is complete.  Tanya Owens is a 63 y.o. 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   According to patient, she has been doing well until last 2 weeks and all the symptoms began.  She reports that she cannot walk around like she normally can and gets extremely winded with any movement.  She reports some chest tightness and palpitations at times.  She is otherwise denying chest pain.  She reports no nausea or vomiting but is having intermittent discomfort in her right upper abdomen and was told several months ago she has mass in her abdomen and needs to get a CT scan.  She denies any bowel changes currently and denies any urinary changes.  Reports she uses compression stockings otherwise has no changes in her leg pain or leg swelling.  She has some chronic arthritis issues.  Denies new trauma.  Reports she is not so never gets headaches but has had severe headache up to 9 out of 10 severity in her head for the last 2 weeks as well.  No fevers or chills reported.  She went to urgent care today and when they were told about her exertional shortness of breath they sent her to the emergency department for further workup.  On exam, patient appears uncomfortable with her severe headache.  Lungs were clear and chest was nontender.  Back nontender.  Abdomen nontender.  Good bowel sounds appreciated.  Legs have compression stockings but had intact sensation strength and pulses.  Minimal edema.  Patient resting.  No other focal neurologic deficits on my exam.  Normal gait.  Will get EKG and labs and a BNP given the exertional fatigue and shortness of breath.  Will get CT of the head as well as CTV given this persistent long headache that she is never had before.  Especially in the context of  a possible mass, we will get a PE study to rule out pulmonary embolism, will get CT abdomen pelvis to rule out gallbladder or new mass problem, and will order headache cocktail.  She reportedly had a chest x-ray recently that was clear and given her persistent and worsening symptoms with cough that she cannot get the  production out, will use the CT to get a better look rather than repeat a chest x-ray that was recently reassuring.  Patient will await further workup and completion of her imaging and labs to determine disposition.               Tanya Owens, Tanya Brim, MD 06/18/23 [phone]

## 2023-06-18 NOTE — ED Notes (Signed)
Patient is being discharged from the Urgent Care and sent to the Emergency Department via POV . Per Izola Price, PA-C, patient is in need of higher level of care due to need for further evaluation of headaches. Patient is aware and verbalizes understanding of plan of care.  Vitals:   06/18/23 0838  BP: 133/84  Pulse: 100  Resp: 16  Temp: 98.2 F (36.8 C)  SpO2: 98%

## 2023-06-19 DIAGNOSIS — Z419 Encounter for procedure for purposes other than remedying health state, unspecified: Secondary | ICD-10-CM | POA: Diagnosis not present

## 2023-06-28 ENCOUNTER — Telehealth: Payer: Self-pay | Admitting: Pediatrics

## 2023-06-28 ENCOUNTER — Encounter: Payer: Self-pay | Admitting: Family Medicine

## 2023-06-28 NOTE — Telephone Encounter (Signed)
 Dr Yvone Herd-  From 05/1423 office note, looks like you wanted a CT abd/pelvis ordered for patient due to hx of adrenal mass that was not previously followed.  Patient had a CT abd/pelvis inpatient on 06/18/23. Please review this and advise.  Of note: patient is scheduled for a "CT adremal abdomen" on 07/02/23 that appears to have been ordered by Dr Alverna John (PCP) at her visit 02/05/23. This exam is currently still scheduled while awaiting your recommendations.

## 2023-06-28 NOTE — Telephone Encounter (Signed)
 Patient called and stated that she had went to the Emergency Room due to her being sick. Patient also stated that at the ED they had order her a MRI and she asked that she had appointment to get one done but if she still needed to get another one. They stated she did not have to get another one done if they do one while she was at the ED. Patient is wanting to know if she needs to keep that appointment for the MRI. Patient is requesting a call back. Please advise.

## 2023-06-28 NOTE — Telephone Encounter (Signed)
 Patient states she had MRI done at ED, patient is wondering if she still has to proceed with 07/02/2023 appointment.

## 2023-06-29 NOTE — Telephone Encounter (Signed)
Patient has been advised of results/recommendations from CT 06/18/23 as per Dr Doy Hutching. Discussed that she can cancel currently scheduled CT and also advised that she will need repeat CT in 1 year which can be completed with PCP. She verbalizes understanding.  Note also sent to PCP on file, Dr Alfredo Martinez.

## 2023-06-30 ENCOUNTER — Telehealth: Payer: Self-pay

## 2023-06-30 NOTE — Telephone Encounter (Signed)
Patient didn't want to instruct for her said that she know her instructions

## 2023-06-30 NOTE — Telephone Encounter (Signed)
Patient asked for new instructions

## 2023-07-02 ENCOUNTER — Other Ambulatory Visit: Payer: Medicaid Other

## 2023-07-03 ENCOUNTER — Other Ambulatory Visit: Payer: Self-pay | Admitting: Student

## 2023-07-03 DIAGNOSIS — E1140 Type 2 diabetes mellitus with diabetic neuropathy, unspecified: Secondary | ICD-10-CM

## 2023-07-11 NOTE — Progress Notes (Unsigned)
 [facility] History and Physical   Primary Care Physician:  Alfredo Martinez, MD   Reason for Procedure:  Right upper quadrant abdominal pain, early satiety, weight loss, constipation, colon cancer screening  Plan:    Diagnostic upper endoscopy and colonoscopy   HPI: ECHO PROPP is a 63 y.o. female undergoing diagnostic upper endoscopy for evaluation of a 10-year history of right upper quadrant abdominal pain with associated early satiety and 20 pound weight loss.  She endorses taking large quantities of ibuprofen for management of her arthritis and abdominal pain.  EGD is performed to evaluate for esophagitis, gastritis, H. pylori infection, peptic ulcer disease, duodenitis.  She is overdue for colon cancer screening -no previous colonoscopy.  Has had lifelong problems with constipation.  No blood or mucus in her stool.  No family history of colorectal cancer or polyps.   Past Medical History:  Diagnosis Date   Arthritis 2000   Diabetes mellitus without complication (HCC) 2005   Hypercholesteremia 2005   Hypertension 2005   Sleep apnea 2010   Sleep disorder     Past Surgical History:  Procedure Laterality Date   CARDIAC CATHETERIZATION  10/18/2007   Normal coronaries, systemic hypertension    Prior to Admission medications   Medication Sig Start Date End Date Taking? Authorizing Provider  amLODipine (NORVASC) 10 MG tablet TAKE 1 TABLET(10 MG) BY MOUTH DAILY 06/01/23   Alfredo Martinez, MD  atorvastatin (LIPITOR) 80 MG tablet TAKE 1 TABLET(80 MG) BY MOUTH DAILY 06/01/23   Alfredo Martinez, MD  clobetasol cream (TEMOVATE) 0.05 % Apply 1 Application topically as needed. 06/08/23   Mardella Layman, MD  ENBREL SURECLICK 50 MG/ML injection Inject into the skin.    [provider]  Etanercept 50 MG/ML SOCT Inject into the skin. 09/20/17   [provider]  folic acid (FOLVITE) 1 MG tablet Take 1 mg by mouth daily. 06/04/22   [provider]  gabapentin  (NEURONTIN) 100 MG capsule TAKE 1 CAPSULE(100 MG) BY MOUTH THREE TIMES DAILY 07/07/23   Alicia Amel, MD  hydrochlorothiazide (HYDRODIURIL) 25 MG tablet Take 1 tablet (25 mg total) by mouth daily. 11/23/22   Alfredo Martinez, MD  ketoconazole (NIZORAL) 2 % cream APPLY TOPICALLY TO THE AFFECTED AREA TWICE DAILY 06/27/22   Alfredo Martinez, MD  lisinopril (ZESTRIL) 40 MG tablet Take 1 tablet (40 mg total) by mouth daily. 11/23/22   Alfredo Martinez, MD  metFORMIN (GLUCOPHAGE) 1000 MG tablet TAKE 1 TABLET(1000 MG) BY MOUTH TWICE DAILY WITH A MEAL 05/22/23   Alfredo Martinez, MD  methotrexate (RHEUMATREX) 2.5 MG tablet TAKE 6 TABLETS(15 MG) BY MOUTH 1 TIME A WEEK 04/13/23   [provider]  polyethylene glycol powder (GLYCOLAX/MIRALAX) 17 GM/SCOOP powder Take 17 g by mouth 2 (two) times daily as needed. Patient not taking: Reported on 06/18/2023 07/21/21   Jackelyn Poling, DO  SUFLAVE 178.7 g SOLR Take by mouth as directed. Patient not taking: Reported on 06/18/2023 06/07/23   [provider]    Current Outpatient Medications  Medication Sig Dispense Refill   amLODipine (NORVASC) 10 MG tablet TAKE 1 TABLET(10 MG) BY MOUTH DAILY 90 tablet 0   atorvastatin (LIPITOR) 80 MG tablet TAKE 1 TABLET(80 MG) BY MOUTH DAILY 90 tablet 0   clobetasol cream (TEMOVATE) 0.05 % Apply 1 Application topically as needed. 45 g 0   ENBREL SURECLICK 50 MG/ML injection Inject into the skin.     Etanercept 50 MG/ML SOCT Inject into the skin.  folic acid (FOLVITE) 1 MG tablet Take 1 mg by mouth daily.     gabapentin (NEURONTIN) 100 MG capsule TAKE 1 CAPSULE(100 MG) BY MOUTH THREE TIMES DAILY 90 capsule 3   hydrochlorothiazide (HYDRODIURIL) 25 MG tablet Take 1 tablet (25 mg total) by mouth daily. 30 tablet 2   ketoconazole (NIZORAL) 2 % cream APPLY TOPICALLY TO THE AFFECTED AREA TWICE DAILY 15 g 0   lisinopril (ZESTRIL) 40 MG tablet Take 1 tablet (40 mg total) by mouth daily. 90 tablet 3   metFORMIN (GLUCOPHAGE) 1000 MG  tablet TAKE 1 TABLET(1000 MG) BY MOUTH TWICE DAILY WITH A MEAL 180 tablet 0   methotrexate (RHEUMATREX) 2.5 MG tablet TAKE 6 TABLETS(15 MG) BY MOUTH 1 TIME A WEEK     polyethylene glycol powder (GLYCOLAX/MIRALAX) 17 GM/SCOOP powder Take 17 g by mouth 2 (two) times daily as needed. (Patient not taking: Reported on 06/18/2023) 3350 g 1   SUFLAVE 178.7 g SOLR Take by mouth as directed. (Patient not taking: Reported on 06/18/2023)     No current facility-administered medications for this visit.    Allergies as of 07/12/2023 - Review Complete 06/18/2023  Allergen Reaction Noted   Tylenol [acetaminophen] Other (See Comments) 11/26/2017    Family History  Problem Relation Age of Onset   Heart attack Mother    Diabetes Mother    Hypercholesterolemia Mother    Hypertension Mother    Diabetes Sister    Hypertension Sister    Hypertension Brother    Hypertension Brother    Alzheimer's disease Maternal Grandmother     Social History   Socioeconomic History   Marital status: Single    Spouse name: Not on file   Number of children: 5   Years of education: Not on file   Highest education level: Not on file  Occupational History   Not on file  Tobacco Use   Smoking status: Former    Current packs/day: 0.00    Types: Cigarettes    Quit date: 12/27/2012    Years since quitting: 10.5   Smokeless tobacco: Never  Vaping Use   Vaping status: Never Used  Substance and Sexual Activity   Alcohol use: No   Drug use: No   Sexual activity: Yes    Birth control/protection: Post-menopausal  Other Topics Concern   Not on file  Social History Narrative   Ms. 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  Physical Exam: Vital signs There were no vitals taken for this visit.  General:   Alert,  Well-developed, well-nourished, pleasant and cooperative in NAD Airway:  Mallampati  Lungs:  Clear throughout to auscultation.   Heart:  Regular rate and rhythm; no murmurs, clicks, rubs,  or gallops. Abdomen:  Soft, nontender and nondistended. Normal bowel sounds.   Neuro/Psych:  Normal mood and affect. A and O x 3  Maren Beach, MD [facility]

## 2023-07-12 ENCOUNTER — Ambulatory Visit (AMBULATORY_SURGERY_CENTER): Payer: Medicaid Other | Admitting: Pediatrics

## 2023-07-12 ENCOUNTER — Encounter: Payer: Self-pay | Admitting: Pediatrics

## 2023-07-12 VITALS — BP 164/96 | HR 83 | Temp 97.7°F | Resp 16 | Ht 67.0 in | Wt 228.0 lb

## 2023-07-12 DIAGNOSIS — K2970 Gastritis, unspecified, without bleeding: Secondary | ICD-10-CM | POA: Diagnosis not present

## 2023-07-12 DIAGNOSIS — B9681 Helicobacter pylori [H. pylori] as the cause of diseases classified elsewhere: Secondary | ICD-10-CM | POA: Diagnosis not present

## 2023-07-12 DIAGNOSIS — R634 Abnormal weight loss: Secondary | ICD-10-CM | POA: Diagnosis not present

## 2023-07-12 DIAGNOSIS — R6881 Early satiety: Secondary | ICD-10-CM

## 2023-07-12 DIAGNOSIS — E119 Type 2 diabetes mellitus without complications: Secondary | ICD-10-CM | POA: Diagnosis not present

## 2023-07-12 DIAGNOSIS — Z1211 Encounter for screening for malignant neoplasm of colon: Secondary | ICD-10-CM | POA: Diagnosis not present

## 2023-07-12 DIAGNOSIS — K649 Unspecified hemorrhoids: Secondary | ICD-10-CM | POA: Diagnosis not present

## 2023-07-12 DIAGNOSIS — K5900 Constipation, unspecified: Secondary | ICD-10-CM | POA: Diagnosis not present

## 2023-07-12 DIAGNOSIS — K31A11 Gastric intestinal metaplasia without dysplasia, involving the antrum: Secondary | ICD-10-CM | POA: Diagnosis not present

## 2023-07-12 DIAGNOSIS — R112 Nausea with vomiting, unspecified: Secondary | ICD-10-CM

## 2023-07-12 DIAGNOSIS — I10 Essential (primary) hypertension: Secondary | ICD-10-CM | POA: Diagnosis not present

## 2023-07-12 DIAGNOSIS — G8929 Other chronic pain: Secondary | ICD-10-CM

## 2023-07-12 DIAGNOSIS — K2950 Unspecified chronic gastritis without bleeding: Secondary | ICD-10-CM | POA: Diagnosis not present

## 2023-07-12 DIAGNOSIS — R1011 Right upper quadrant pain: Secondary | ICD-10-CM | POA: Diagnosis not present

## 2023-07-12 DIAGNOSIS — G4730 Sleep apnea, unspecified: Secondary | ICD-10-CM | POA: Diagnosis not present

## 2023-07-12 MED ORDER — LINACLOTIDE 290 MCG PO CAPS: 290.0000 ug | ORAL_CAPSULE | Freq: Every day | ORAL | 3 refills | Status: DC

## 2023-07-12 MED ORDER — NA SULFATE-K SULFATE-MG SULF 17.5-3.13-1.6 GM/177ML PO SOLN: 1.0000 | Freq: Once | ORAL | 0 refills | Status: AC

## 2023-07-12 MED ORDER — SODIUM CHLORIDE 0.9 % IV SOLN: 500.0000 mL | INTRAVENOUS | Status: DC

## 2023-07-12 NOTE — Op Note (Signed)
 [facility] Patient Name: Tanya Owens Procedure Date: 07/12/2023 2:41 PM MRN: [id number] Endoscopist: Maren Beach , MD, [id number] Age: 63 Referring MD:  Date of Birth: 1960/07/28 Gender: Female Account #: [id number] Procedure:                Upper GI endoscopy Indications:              Abdominal pain in the right upper quadrant, Early                            satiety, Nausea, Weight loss Medicines:                Monitored Anesthesia Care Procedure:                Pre-Anesthesia Assessment:                           - Prior to the procedure, a History and Physical                            was performed, and patient medications and                            allergies were reviewed. The patient's tolerance of                            previous anesthesia was also reviewed. The risks                            and benefits of the procedure and the sedation                            options and risks were discussed with the patient.                            All questions were answered, and informed consent                            was obtained. Prior Anticoagulants: The patient has                            taken no anticoagulant or antiplatelet agents. ASA                            Grade Assessment: III - A patient with severe                            systemic disease. After reviewing the risks and                            benefits, the patient was deemed in satisfactory                            condition to undergo the procedure.  After obtaining informed consent, the endoscope was                            passed under direct vision. Throughout the                            procedure, the patient's blood pressure, pulse, and                            oxygen saturations were monitored continuously. The                            GIF [id number] #[id number] was introduced through the                            mouth, and advanced to  the second part of duodenum.                            The upper GI endoscopy was accomplished without                            difficulty. The patient tolerated the procedure                            well. Scope In: Scope Out: Findings:                 The examined esophagus was normal.                           Diffuse mild inflammation was found in the entire                            examined stomach. Biopsies were taken with a cold                            forceps for Helicobacter pylori testing.                           The cardia (on retroflexion) and gastric fundus (on                            retroflexion) were normal.                           The duodenal bulb and second portion of the                            duodenum were normal. Complications:            No immediate complications. Estimated blood loss:                            Minimal. Estimated Blood Loss:     Estimated blood loss was minimal. Impression:               -  Normal esophagus.                           - Gastritis. Biopsied.                           - Normal cardia and gastric fundus.                           - Normal duodenal bulb and second portion of the                            duodenum. Recommendation:           - Await pathology results.                           - Perform a colonoscopy today.                           - The findings and recommendations were discussed                            with the patient's family. Maren Beach, MD 07/12/2023 3:10:36 PM This report has been signed electronically.

## 2023-07-12 NOTE — Op Note (Signed)
 [facility] Patient Name: Tanya Owens Procedure Date: 07/12/2023 2:41 PM MRN: [id number] Endoscopist: Maren Beach , MD, [id number] Age: 63 Referring MD:  Date of Birth: [birth year] Gender: Female Account #: [id number] Procedure:                Colonoscopy Indications:              Screening for colorectal malignant neoplasm, This                            is the patient's first colonoscopy, Incidental                            abdominal pain noted, Incidental constipation noted Medicines:                Monitored Anesthesia Care Procedure:                Pre-Anesthesia Assessment:                           - Prior to the procedure, a History and Physical                            was performed, and patient medications and                            allergies were reviewed. The patient's tolerance of                            previous anesthesia was also reviewed. The risks                            and benefits of the procedure and the sedation                            options and risks were discussed with the patient.                            All questions were answered, and informed consent                            was obtained. Prior Anticoagulants: The patient has                            taken no anticoagulant or antiplatelet agents. ASA                            Grade Assessment: III - A patient with severe                            systemic disease. After reviewing the risks and                            benefits, the patient was deemed in satisfactory  condition to undergo the procedure.                           After obtaining informed consent, the colonoscope                            was passed under direct vision. Throughout the                            procedure, the patient's blood pressure, pulse, and                            oxygen saturations were monitored continuously. The                             Olympus Scope SN [id number] was introduced through the                            anus with the intention of advancing to the cecum.                            The scope was advanced to the descending colon                            before the procedure was aborted. Medications were                            given. The colonoscopy was performed without                            difficulty. The patient tolerated the procedure                            well. The quality of the bowel preparation was                            inadequate. The rectum was photographed. Scope In: 3:01:33 PM Scope Out: 3:06:35 PM Total Procedure Duration: 0 hours 5 minutes 2 seconds  Findings:                 Hemorrhoids were found on perianal exam.                           A large amount of semi-liquid stool was found in                            the recto-sigmoid colon and in the descending                            colon, interfering with visualization. Lavage of                            the area was performed using a large amount of  sterile water, resulting in incomplete clearance                            with continued poor visualization. Complications:            No immediate complications. Estimated blood loss:                            None. Estimated Blood Loss:     Estimated blood loss: none. Impression:               - Preparation of the colon was inadequate.                           - Hemorrhoids found on perianal exam.                           - Stool in the recto-sigmoid colon and in the                            descending colon.                           - No specimens collected. Recommendation:           - Discharge patient to home (ambulatory).                           - Repeat colonoscopy at the next available                            appointment because the bowel preparation was poor.                            Patient needs a 2 day bowel prep for  next                            colonoscopy.                           - Return to GI clinic in 4 weeks with Dr. Doy Hutching                            or APP.                           - The findings and recommendations were discussed                            with the patient's family.                           - Patient has a contact number available for                            emergencies. The signs and symptoms of potential  delayed complications were discussed with the                            patient. Return to normal activities tomorrow.                            Written discharge instructions were provided to the                            patient. Maren Beach, MD 07/12/2023 3:15:21 PM This report has been signed electronically.

## 2023-07-12 NOTE — Patient Instructions (Addendum)
 Appointments made for follow up with Dr. Doy Hutching and repeat colonoscopy.  Linzess sent into pharmacy.  Please call Dr. Terie Purser office if you have diarrhea after taking Linzess.  Handout provided on hemorrhoids.    YOU HAD AN ENDOSCOPIC PROCEDURE TODAY AT THE [facility]:   Refer to the procedure report that was given to you for any specific questions about what was found during the examination.  If the procedure report does not answer your questions, please call your gastroenterologist to clarify.  If you requested that your care partner not be given the details of your procedure findings, then the procedure report has been included in a sealed envelope for you to review at your convenience later.  YOU SHOULD EXPECT: Some feelings of bloating in the abdomen. Passage of more gas than usual.  Walking can help get rid of the air that was put into your GI tract during the procedure and reduce the bloating. If you had a lower endoscopy (such as a colonoscopy or flexible sigmoidoscopy) you may notice spotting of blood in your stool or on the toilet paper. If you underwent a bowel prep for your procedure, you may not have a normal bowel movement for a few days.  Please Note:  You might notice some irritation and congestion in your nose or some drainage.  This is from the oxygen used during your procedure.  There is no need for concern and it should clear up in a day or so.  SYMPTOMS TO REPORT IMMEDIATELY:  Following lower endoscopy (colonoscopy or flexible sigmoidoscopy):  Excessive amounts of blood in the stool  Significant tenderness or worsening of abdominal pains  Swelling of the abdomen that is new, acute  Fever of 100F or higher  For urgent or emergent issues, a gastroenterologist can be reached at any hour by calling [phone]. Do not use MyChart messaging for urgent concerns.    DIET:  We do recommend a small meal at first, but then you may proceed to your regular diet.   Drink plenty of fluids but you should avoid alcoholic beverages for 24 hours.  ACTIVITY:  You should plan to take it easy for the rest of today and you should NOT DRIVE or use heavy machinery until tomorrow (because of the sedation medicines used during the test).    FOLLOW UP: Our staff will call the number listed on your records the next business day following your procedure.  We will call around 7:15- 8:00 am to check on you and address any questions or concerns that you may have regarding the information given to you following your procedure. If we do not reach you, we will leave a message.     If any biopsies were taken you will be contacted by phone or by letter within the next 1-3 weeks.  Please call us at [phone] if you have not heard about the biopsies in 3 weeks.    SIGNATURES/CONFIDENTIALITY: You and/or your care partner have signed paperwork which will be entered into your electronic medical record.  These signatures attest to the fact that that the information above on your After Visit Summary has been reviewed and is understood.  Full responsibility of the confidentiality of this discharge information lies with you and/or your care-partner.

## 2023-07-12 NOTE — Progress Notes (Signed)
 Pt sedate, gd SR's, VSS, report to RN

## 2023-07-13 ENCOUNTER — Telehealth: Payer: Self-pay

## 2023-07-13 ENCOUNTER — Other Ambulatory Visit: Payer: Medicaid Other

## 2023-07-13 NOTE — Telephone Encounter (Signed)
 Left message on answering machine.

## 2023-07-13 NOTE — Telephone Encounter (Signed)
 Patient states she is doing well, after procedure, has no further questions or concerns at this time.

## 2023-07-15 ENCOUNTER — Encounter: Payer: Self-pay | Admitting: Pediatrics

## 2023-07-15 LAB — SURGICAL PATHOLOGY

## 2023-07-16 ENCOUNTER — Telehealth: Payer: Self-pay | Admitting: Pharmacy Technician

## 2023-07-16 ENCOUNTER — Other Ambulatory Visit (HOSPITAL_COMMUNITY): Payer: Self-pay

## 2023-07-16 ENCOUNTER — Telehealth: Payer: Self-pay | Admitting: *Deleted

## 2023-07-16 MED ORDER — BISMUTH/METRONIDAZ/TETRACYCLIN 140-125-125 MG PO CAPS: 3.0000 | ORAL_CAPSULE | Freq: Three times a day (TID) | ORAL | 0 refills | Status: DC

## 2023-07-16 MED ORDER — OMEPRAZOLE 40 MG PO CPDR: 40.0000 mg | DELAYED_RELEASE_CAPSULE | Freq: Every day | ORAL | 0 refills | Status: DC

## 2023-07-16 NOTE — Telephone Encounter (Signed)
Error

## 2023-07-16 NOTE — Telephone Encounter (Signed)
 Pharmacy Patient Advocate Encounter   Received notification from Patient Pharmacy that prior authorization for [facility]) is required/requested.   Insurance verification completed.   The patient is insured through [facility] MEDICAID .   Per test claim:  PYLERA is preferred by the insurance.  If suggested medication is appropriate, Please send in a new RX and discontinue this one. If not, please advise as to why it's not appropriate so that we may request a Prior Authorization. Please note, some preferred medications may still require a PA.  If the suggested medications have not been trialed and there are no contraindications to their use, the PA will not be submitted, as it will not be approved.  PA NOT NEEDED FOR BRAND PYLERA. TEST BILLING RESULTS WITH [facility] RETURNED A $4 COPAY

## 2023-07-16 NOTE — Telephone Encounter (Signed)
 I have spoken to patient to advise of H Pylori infection. Discussed that we will send Pylera for her to take 3 capsules four times daily x 10 days as well as omeprazole 40 mg daily x 10 days for eradication. Patient is also advised that we will have her to complete a stool test in about 4-8 weeks to check for eradication. She verbalizes understanding.

## 2023-07-16 NOTE — Telephone Encounter (Signed)
 Left message for patient to call back

## 2023-07-16 NOTE — Telephone Encounter (Signed)
-----   Message from Ottie Glazier sent at 07/15/2023  9:39 PM EST ----- Regarding: H. pylori treatment Hi Tanya Owens -  Tanya Owens gastric biopsies are positive for H. pylori.  Please send in prescriptions for Pylera x 10 days as well as omeprazole 40 mg orally x 10 days  Please also coordinate for her to have an H. pylori stool antigen submitted 4 to 8 weeks after completing H. pylori treatment for test of cure.  Thanks,  Harriett Sine

## 2023-07-17 DIAGNOSIS — Z419 Encounter for procedure for purposes other than remedying health state, unspecified: Secondary | ICD-10-CM | POA: Diagnosis not present

## 2023-07-19 MED ORDER — PYLERA 140-125-125 MG PO CAPS: 3.0000 | ORAL_CAPSULE | Freq: Three times a day (TID) | ORAL | 0 refills | Status: AC

## 2023-07-19 NOTE — Telephone Encounter (Signed)
 Brand name Pylera sent to pharmacy

## 2023-07-30 ENCOUNTER — Other Ambulatory Visit: Payer: Self-pay | Admitting: Student

## 2023-07-30 DIAGNOSIS — I10 Essential (primary) hypertension: Secondary | ICD-10-CM

## 2023-08-02 NOTE — Telephone Encounter (Signed)
 Inbound call to further discuss Pylera. She said she hasn't received it and very confused about it. Please advis.

## 2023-08-02 NOTE — Telephone Encounter (Signed)
 Contacted patient's pharmacy, she picked up Pylera for 4 dollars on 07/30/23. Patient states that she is taking medication for a mass seen on an ultrasound from when she was in the emergency department for high blood pressure. After further discussion, patient was speaking of Pylera. She is advised again that this medication is used to get rid of H Pylori bacteria, a bacteria found in the stomach. Advised she does NOT have a mass in the stomach. 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 Chronic H Pylori can cause anemia, gastric ulcer or gastric cancer, so important that she take as directed. Patient states that she was also scheduled for a colonoscopy but cancelled it because she wanted to get this taken care of first. States that she continues to have intermittent abdominal pain. Advised patient that this could be in relation to h pylori, however, colonoscopy would be the plan to further evaluate. Patient states that she has constipation and was told that the last procedure was not completed because she was not cleaned out. She is advised that we were having her to complete a 2 day prep with suprep and miralax as available on her mychart. Patient verbalizes understanding and says she will complete pylera and call back to schedule colonoscopy.

## 2023-08-05 ENCOUNTER — Ambulatory Visit: Admitting: Student

## 2023-08-05 NOTE — Progress Notes (Deleted)
    SUBJECTIVE:   CHIEF COMPLAINT / HPI:   Requests Labs: - Presenting today requesting labwork  -   PERTINENT  PMH / PSH:  HTN - hydrochlorothiazide, amlodipine, and Lisinopril DM2 - On Metformin, Gabapentin Plantar fasciitis  HLD - Lipitor  Obesity  RA - Follows rheumatology on MTX H Pylori - Took Pylera, per last EGD results, 'it is recommended that you have a follow-up upper endoscopy in 1 year for additional biopsies to be performed.'  OBJECTIVE:   There were no vitals taken for this visit.  General: Alert and oriented in no apparent distress Heart: Regular rate and rhythm with no murmurs appreciated Lungs: CTA bilaterally, no wheezing Abdomen: Bowel sounds present, no abdominal pain Skin: Warm and dry Extremities: No lower extremity edema   ASSESSMENT/PLAN:   Assessment & Plan      Tanya Martinez, MD [facility]

## 2023-08-09 ENCOUNTER — Inpatient Hospital Stay: Admission: RE | Admit: 2023-08-09 | Payer: Medicaid Other | Source: Ambulatory Visit

## 2023-08-10 ENCOUNTER — Ambulatory Visit
Admission: RE | Admit: 2023-08-10 | Discharge: 2023-08-10 | Disposition: A | Discharge status: Home / Self Care | Source: Ambulatory Visit | Attending: Family Medicine | Admitting: Family Medicine

## 2023-08-10 DIAGNOSIS — R109 Unspecified abdominal pain: Secondary | ICD-10-CM | POA: Diagnosis not present

## 2023-08-10 DIAGNOSIS — E278 Other specified disorders of adrenal gland: Secondary | ICD-10-CM

## 2023-08-10 DIAGNOSIS — G8929 Other chronic pain: Secondary | ICD-10-CM | POA: Diagnosis not present

## 2023-08-12 ENCOUNTER — Other Ambulatory Visit: Payer: Self-pay | Admitting: Student

## 2023-08-12 DIAGNOSIS — E119 Type 2 diabetes mellitus without complications: Secondary | ICD-10-CM

## 2023-08-12 DIAGNOSIS — I10 Essential (primary) hypertension: Secondary | ICD-10-CM

## 2023-08-16 ENCOUNTER — Encounter: Payer: Self-pay | Admitting: Family Medicine

## 2023-08-18 ENCOUNTER — Telehealth: Payer: Self-pay | Admitting: Pediatrics

## 2023-08-18 NOTE — Telephone Encounter (Signed)
 Inbound call from patient requesting a call to discuss abdominal pain she is still experiencing. Patient states she has finished antibiotic prescription but is still experiencing pain. Patient is requesting a call back to discuss further. Please advise, thank you.

## 2023-08-19 ENCOUNTER — Encounter: Payer: Medicaid Other | Admitting: Pediatrics

## 2023-08-19 NOTE — Telephone Encounter (Signed)
 Called and spoke with patient. Pt completed Pylera on 08/16/23, she has noticed significant improvement in her symptoms but continues to have some abdominal discomfort - could be related to constipation. Patient has been on Linzess 290 mcg for about a week and has not had a BM since last Wednesday. Patient is not taking Miralax in addition to Linzess. Pt states that the only thing that "cleans her out" is the bowel prep. Patient is passing gas. I advised that she may need an alternative treatment. Patient had a CTAP on 3/25 ordered by PCP. Results are available in epic. Patient plans to follow up with PCP on 4/8/2 before rescheduling colonoscopy. Please advise, thanks.  IMPRESSION: *No acute findings in the abdomen. 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 *Abundant residual fecal material throughout the visualized portions of the colon.

## 2023-08-19 NOTE — Telephone Encounter (Signed)
 Called and spoke with patient. Patient does not have MyChart and asked if she could come by to pick up a copy of the recommendations. I have printed recommendations and placed at 2nd floor receptionist desk for patient to pick up today. Pt had no concerns at the end of the call.

## 2023-08-24 ENCOUNTER — Encounter: Payer: Self-pay | Admitting: Student

## 2023-08-24 ENCOUNTER — Ambulatory Visit (INDEPENDENT_AMBULATORY_CARE_PROVIDER_SITE_OTHER): Admitting: Student

## 2023-08-24 VITALS — BP 132/86 | HR 86 | Ht 67.0 in | Wt 221.8 lb

## 2023-08-24 DIAGNOSIS — E278 Other specified disorders of adrenal gland: Secondary | ICD-10-CM

## 2023-08-24 DIAGNOSIS — Z7984 Long term (current) use of oral hypoglycemic drugs: Secondary | ICD-10-CM | POA: Diagnosis not present

## 2023-08-24 DIAGNOSIS — E1140 Type 2 diabetes mellitus with diabetic neuropathy, unspecified: Secondary | ICD-10-CM | POA: Diagnosis not present

## 2023-08-24 DIAGNOSIS — R1011 Right upper quadrant pain: Secondary | ICD-10-CM

## 2023-08-24 LAB — POCT GLYCOSYLATED HEMOGLOBIN (HGB A1C): HbA1c, POC (controlled diabetic range): 6 % (ref 0.0–7.0)

## 2023-08-24 MED ORDER — SENNA 8.6 MG PO TABS: 2.0000 | ORAL_TABLET | Freq: Every day | ORAL | 0 refills | Status: AC

## 2023-08-24 NOTE — Progress Notes (Deleted)
    SUBJECTIVE:   CHIEF COMPLAINT / HPI:   Requests Labs  Chronic Abdominal Pain: - Presenting today requesting labwork   The patient, with a history of diabetes, presents for an abnormal growth on the adrenal gland, chronic constipation, and unintentional weight loss. The adrenal adenoma was discovered during a CT scan of the abdomen. The patient reports no symptoms related to this to her knowledge   The patient's diabetes is well-controlled with metformin, with a recent A1c of 6.  The patient has been experiencing chronic constipation for over 20 years and has been using over-the-counter medications like Miralax for relief. Despite these efforts, the patient reports only being able to have a bowel movement once every week and a half. The patient's constipation has also interfered with a recent colonoscopy, as the patient was not able to be adequately cleaned out for the procedure.  The patient also reports a recent severe stomach infection caused by H. Pylori. The patient has completed a course of antibiotics for the infection, but it is unclear if the infection has been fully resolved.  Lastly, the patient reports unintentional weight loss, dropping from 262 pounds to 220 pounds within the last year. The patient reports feeling full after eating only small amounts of food.  PERTINENT  PMH / PSH:  HTN - hydrochlorothiazide, amlodipine, and Lisinopril DM2 - On Metformin, Gabapentin Plantar fasciitis  HLD - Lipitor  Obesity -  RA - Follows rheumatology on MTX H Pylori - Took Pylera, per last EGD results, 'it is recommended that you have a follow-up upper endoscopy in 1 year for additional biopsies to be performed.'  OBJECTIVE:   BP 132/86   Pulse 86   Ht 5\' 7"  (1.702 m)   Wt 221 lb 12.8 oz (100.6 kg)   SpO2 100%   BMI 34.74 kg/m   General: Alert and oriented in no apparent distress Heart: Regular rate and rhythm with no murmurs appreciated Lungs: CTA bilaterally, no  wheezing Abdomen: Bowel sounds present, right upper and lower quadrant TTP, nondistended and soft  Skin: Warm and dry   ASSESSMENT/PLAN:   Assessment & Plan Right upper quadrant abdominal pain In setting of chronic constipation requires regular laxative use, affecting colonoscopy preparation. Weight loss concerning, no other B symptoms noted.  - Continue Miralax for constipation management and Senna - Consider additional treatments if needed. - Coordinate with GI for colonoscopy preparation. - Schedule follow-up in one month to assess management.  Adrenal mass [facility]) Adrenal mass requires evaluation for hormonal abnormalities - Order renin aldo ratio to assess for abnormal secretion with CBC and CMP - Refer to endocrinology for further evaluation and specialized labs. - Schedule follow-up in one month Type 2 diabetes mellitus with diabetic neuropathy, without long-term current use of insulin (HCC) Diabetes well-controlled with A1c of 6 on metformin. - Continue metformin. - Monitor blood glucose levels regularly.      Alfredo Martinez, MD [facility]

## 2023-08-24 NOTE — Patient Instructions (Addendum)
 It was great to see you today! Thank you for choosing [facility] for your primary care.  Today we addressed: Checking labs today  For constipation: Senna twice daily and Miralax daily for 1-2 BM a day  Follow up with GI  Endocrinology will call you   If you haven't already, sign up for My Chart to have easy access to your labs results, and communication with your primary care physician.   Please arrive 15 minutes before your appointment to ensure smooth check in process.  We appreciate your efforts in making this happen.  Thank you for allowing me to participate in your care, Alfredo Martinez, MD 08/24/2023, 9:48 AM PGY-3, [facility]

## 2023-08-24 NOTE — Assessment & Plan Note (Deleted)
 In setting of chronic constipation requires regular laxative use, affecting colonoscopy preparation. Weight loss concerning, no other B symptoms noted.  - Continue Miralax for constipation management and Senna - Consider additional treatments if needed. - Coordinate with GI for colonoscopy preparation. - Schedule follow-up in one month to assess management.

## 2023-08-24 NOTE — Assessment & Plan Note (Deleted)
 Diabetes well-controlled with A1c of 6 on metformin. - Continue metformin. - Monitor blood glucose levels regularly.

## 2023-08-24 NOTE — Progress Notes (Cosign Needed Addendum)
    SUBJECTIVE:   CHIEF COMPLAINT / HPI:   Requests Labs  Chronic Abdominal Pain: - Presenting today requesting labwork   The patient, with a history of diabetes, presents for an abnormal growth on the adrenal gland, chronic constipation, and unintentional weight loss. The adrenal adenoma was discovered during a CT scan of the abdomen. The patient reports no symptoms related to this to her knowledge   The patient's diabetes is well-controlled with metformin, with a recent A1c of 6.  The patient has been experiencing chronic constipation for over 20 years and has been using over-the-counter medications like Miralax for relief. Despite these efforts, the patient reports only being able to have a bowel movement once every week and a half. The patient's constipation has also interfered with a recent colonoscopy, as the patient was not able to be adequately cleaned out for the procedure.  The patient also reports a recent severe stomach infection caused by H. Pylori. The patient has completed a course of antibiotics for the infection, but it is unclear if the infection has been fully resolved.  Lastly, the patient reports unintentional weight loss, dropping from 262 pounds to 220 pounds within the last year. The patient reports feeling full after eating only small amounts of food.  PERTINENT  PMH / PSH:  HTN - hydrochlorothiazide, amlodipine, and Lisinopril DM2 - On Metformin, Gabapentin Plantar fasciitis  HLD - Lipitor  Obesity -  RA - Follows rheumatology on MTX H Pylori - Took Pylera, per last EGD results, 'it is recommended that you have a follow-up upper endoscopy in 1 year for additional biopsies to be performed.'  OBJECTIVE:   BP 132/86   Pulse 86   Ht 5\' 7"  (1.702 m)   Wt 221 lb 12.8 oz (100.6 kg)   SpO2 100%   BMI 34.74 kg/m   General: Alert and oriented in no apparent distress Heart: Regular rate and rhythm with no murmurs appreciated Lungs: CTA bilaterally, no  wheezing Abdomen: Bowel sounds present, right upper and lower quadrant TTP, nondistended and soft  Skin: Warm and dry   ASSESSMENT/PLAN:   Assessment & Plan Right upper quadrant abdominal pain In setting of chronic constipation requires regular laxative use, affecting colonoscopy preparation. Weight loss concerning, no other B symptoms noted.  - Continue Miralax for constipation management and Senna - Consider additional treatments if needed. - Coordinate with GI for colonoscopy preparation. - Schedule follow-up in one month to assess management.  Adrenal mass [facility]) Adrenal mass requires evaluation for hormonal abnormalities - Order renin aldo ratio to assess for abnormal secretion with CBC and CMP - Refer to endocrinology for further evaluation and specialized labs. - Schedule follow-up in one month Type 2 diabetes mellitus with diabetic neuropathy, without long-term current use of insulin (HCC) Diabetes well-controlled with A1c of 6 on metformin. - Continue metformin. - Monitor blood glucose levels regularly.      Alfredo Martinez, MD [facility]

## 2023-08-25 LAB — COMPREHENSIVE METABOLIC PANEL WITH GFR
ALT: 20 IU/L (ref 0–32)
AST: 25 IU/L (ref 0–40)
Albumin: 4.8 g/dL (ref 3.9–4.9)
Alkaline Phosphatase: 109 IU/L (ref 44–121)
BUN/Creatinine Ratio: 11 — ABNORMAL LOW (ref 12–28)
BUN: 9 mg/dL (ref 8–27)
Bilirubin Total: 0.6 mg/dL (ref 0.0–1.2)
CO2: 21 mmol/L (ref 20–29)
Calcium: 10.2 mg/dL (ref 8.7–10.3)
Chloride: 100 mmol/L (ref 96–106)
Creatinine, Ser: 0.79 mg/dL (ref 0.57–1.00)
Globulin, Total: 3 g/dL (ref 1.5–4.5)
Glucose: 95 mg/dL (ref 70–99)
Potassium: 4.2 mmol/L (ref 3.5–5.2)
Sodium: 138 mmol/L (ref 134–144)
Total Protein: 7.8 g/dL (ref 6.0–8.5)
eGFR: 83 mL/min/{1.73_m2} (ref >59)

## 2023-08-26 ENCOUNTER — Other Ambulatory Visit

## 2023-08-26 ENCOUNTER — Telehealth: Payer: Self-pay

## 2023-08-26 DIAGNOSIS — A048 Other specified bacterial intestinal infections: Secondary | ICD-10-CM

## 2023-08-26 NOTE — Telephone Encounter (Signed)
 I spoke to [place] and I advised her that Dr. Doy Hutching wants her to have H-Pylori stool testing done.  I told her that the orders are in and she can stop by our lab to pick up the kit for collection and instructions when to return.  I called her back at 10:17am and left a detailed vml message stating that I noticed that she had not reviewed her results message from Dr. Doy Hutching.  I explained that she should review that and before she collects her stool she has to have completed the treatment that was sent in.  Her collection should be done on or around 4/21 if she completed the medication.  She should call if she has any additional questions.

## 2023-08-26 NOTE — Assessment & Plan Note (Signed)
 Diabetes well-controlled with A1c of 6 on metformin. - Continue metformin. - Monitor blood glucose levels regularly.

## 2023-08-26 NOTE — Assessment & Plan Note (Signed)
 In setting of chronic constipation requires regular laxative use, affecting colonoscopy preparation. Weight loss concerning, no other B symptoms noted.  - Continue Miralax for constipation management and Senna - Consider additional treatments if needed. - Coordinate with GI for colonoscopy preparation. - Schedule follow-up in one month to assess management.

## 2023-08-28 DIAGNOSIS — Z419 Encounter for procedure for purposes other than remedying health state, unspecified: Secondary | ICD-10-CM | POA: Diagnosis not present

## 2023-08-30 ENCOUNTER — Other Ambulatory Visit

## 2023-08-30 DIAGNOSIS — A048 Other specified bacterial intestinal infections: Secondary | ICD-10-CM | POA: Diagnosis not present

## 2023-08-30 LAB — CBC WITH DIFFERENTIAL/PLATELET
Basophils Absolute: 0 10*3/uL (ref 0.0–0.2)
Basos: 1 %
EOS (ABSOLUTE): 0.5 10*3/uL — ABNORMAL HIGH (ref 0.0–0.4)
Eos: 6 %
Hematocrit: 40.5 % (ref 34.0–46.6)
Hemoglobin: 13.4 g/dL (ref 11.1–15.9)
Immature Grans (Abs): 0 10*3/uL (ref 0.0–0.1)
Immature Granulocytes: 0 %
Lymphocytes Absolute: 3.1 10*3/uL (ref 0.7–3.1)
Lymphs: 35 %
MCH: 29.1 pg (ref 26.6–33.0)
MCHC: 33.1 g/dL (ref 31.5–35.7)
MCV: 88 fL (ref 79–97)
Monocytes Absolute: 0.6 10*3/uL (ref 0.1–0.9)
Monocytes: 6 %
Neutrophils Absolute: 4.5 10*3/uL (ref 1.4–7.0)
Neutrophils: 52 %
Platelets: 320 10*3/uL (ref 150–450)
RBC: 4.6 x10E6/uL (ref 3.77–5.28)
RDW: 14 % (ref 11.7–15.4)
WBC: 8.7 10*3/uL (ref 3.4–10.8)

## 2023-08-30 LAB — ALDOSTERONE + RENIN ACTIVITY W/ RATIO
Aldos/Renin Ratio: 7.1 (ref 0.0–30.0)
Aldosterone: 3.4 ng/dL (ref 0.0–30.0)
Renin Activity, Plasma: 0.48 ng/mL/h (ref 0.167–5.380)

## 2023-08-31 ENCOUNTER — Encounter: Payer: Self-pay | Admitting: Student

## 2023-09-01 ENCOUNTER — Encounter: Payer: Self-pay | Admitting: Pediatrics

## 2023-09-01 LAB — H. PYLORI ANTIGEN, STOOL: H pylori Ag, Stl: NEGATIVE

## 2023-09-05 ENCOUNTER — Encounter (HOSPITAL_COMMUNITY): Payer: Self-pay | Admitting: Student

## 2023-09-10 ENCOUNTER — Telehealth: Payer: Self-pay | Admitting: Pediatrics

## 2023-09-10 NOTE — Telephone Encounter (Signed)
 H. Pylori results provided to patient. No further questions.

## 2023-09-10 NOTE — Telephone Encounter (Signed)
 PT is calling about lab results. Please advise.

## 2023-09-21 ENCOUNTER — Telehealth: Payer: Self-pay

## 2023-09-21 ENCOUNTER — Other Ambulatory Visit (HOSPITAL_COMMUNITY): Payer: Self-pay

## 2023-09-21 ENCOUNTER — Encounter: Payer: Self-pay | Admitting: Pediatrics

## 2023-09-21 ENCOUNTER — Ambulatory Visit (INDEPENDENT_AMBULATORY_CARE_PROVIDER_SITE_OTHER): Payer: Medicaid Other | Admitting: Pediatrics

## 2023-09-21 VITALS — BP 114/76 | HR 82 | Ht 67.0 in | Wt 224.4 lb

## 2023-09-21 DIAGNOSIS — R1011 Right upper quadrant pain: Secondary | ICD-10-CM

## 2023-09-21 DIAGNOSIS — K5900 Constipation, unspecified: Secondary | ICD-10-CM

## 2023-09-21 DIAGNOSIS — A048 Other specified bacterial intestinal infections: Secondary | ICD-10-CM

## 2023-09-21 DIAGNOSIS — G8929 Other chronic pain: Secondary | ICD-10-CM | POA: Diagnosis not present

## 2023-09-21 DIAGNOSIS — K219 Gastro-esophageal reflux disease without esophagitis: Secondary | ICD-10-CM | POA: Diagnosis not present

## 2023-09-21 DIAGNOSIS — B9681 Helicobacter pylori [H. pylori] as the cause of diseases classified elsewhere: Secondary | ICD-10-CM

## 2023-09-21 DIAGNOSIS — Z1211 Encounter for screening for malignant neoplasm of colon: Secondary | ICD-10-CM

## 2023-09-21 MED ORDER — NA SULFATE-K SULFATE-MG SULF 17.5-3.13-1.6 GM/177ML PO SOLN: 1.0000 | Freq: Once | ORAL | 0 refills | Status: AC

## 2023-09-21 MED ORDER — TRULANCE 3 MG PO TABS: 1.0000 | ORAL_TABLET | Freq: Every day | ORAL | 3 refills | Status: DC

## 2023-09-21 MED ORDER — TRULANCE 3 MG PO TABS: 1.0000 | ORAL_TABLET | Freq: Every day | ORAL | 0 refills | Status: DC

## 2023-09-21 MED ORDER — OMEPRAZOLE 40 MG PO CPDR: 40.0000 mg | DELAYED_RELEASE_CAPSULE | Freq: Every day | ORAL | 3 refills | Status: DC

## 2023-09-21 NOTE — Patient Instructions (Addendum)
 Stop Linzess . Start Trulance 3mg  once daily.  Continue 2 Senna daily and Miralax  1-2 caps daily.  We have sent the following medications to your pharmacy for you to pick up at your convenience: Trulance 3 mg once daily.  Omeprazole  40 mg, take 30 minutes before meals  We have given you samples of the following medication to take: Trulance 3 mg, take once daily.  You have been scheduled for a colonoscopy. Please follow written instructions given to you at your visit today.   If you use inhalers (even only as needed), please bring them with you on the day of your procedure.  DO NOT TAKE 7 DAYS PRIOR TO TEST- Trulicity (dulaglutide) Ozempic, Wegovy (semaglutide) Mounjaro (tirzepatide) Bydureon Bcise (exanatide extended release)  DO NOT TAKE 1 DAY PRIOR TO YOUR TEST Rybelsus (semaglutide) Adlyxin (lixisenatide) Victoza (liraglutide) Byetta (exanatide) ___________________________________________________________________________   Thank you for entrusting me with your care and for choosing [facility],  Dr. Eugenia Hess  _______________________________________________________  If your blood pressure at your visit was 140/90 or greater, please contact your primary care physician to follow up on this.  _______________________________________________________  If you are age 74 or older, your body mass index should be between 23-30. Your Body mass index is 35.15 kg/m. If this is out of the aforementioned range listed, please consider follow up with your Primary Care Provider.  If you are age 12 or younger, your body mass index should be between 19-25. Your Body mass index is 35.15 kg/m. If this is out of the aformentioned range listed, please consider follow up with your Primary Care Provider.   ________________________________________________________  The [facility] providers would like to encourage you to use MYCHART to communicate with providers for non-urgent requests  or questions.  Due to long hold times on the telephone, sending your provider a message by [facility] may be a faster and more efficient way to get a response.  Please allow 48 business hours for a response.  Please remember that this is for non-urgent requests.  _______________________________________________________

## 2023-09-21 NOTE — Progress Notes (Signed)
 Tanya Owens [id number] 07-25-1960   Chief Complaint: Constipation, abdominal pain  Referring Provider: Ernestina Headland, MD Primary GI MD: Dr. Yvone Herd  HPI: Tanya Owens is a 63 y.o. female with past medical history of T2DM, RA, mixed hyperlipidemia, hemorrhoids, sleep apnea, and obesity who presents today for follow-up of abdominal pain, H. pylori infection, constipation.  Patient last seen in the office 06/01/2023 for 10+ year history of RUQ abdominal pain with recent associated early satiety and 20 pound weight loss.  She reported taking large quantities of ibuprofen  at that time for arthritis and abdominal pain.  She was overdue for her colon cancer screening, and was having some constipation.  Imaging was noteworthy for hepatic steatosis. Her liver enzymes were normal.    She was scheduled for diagnostic EGD and screening colonoscopy.  CT was ordered to follow-up on an adrenal lesion.  She was advised to start MiraLAX  for management of her constipation with option to consider a prescription laxative in the future.  Patient was found to have gastritis on upper endoscopy 07/12/2023, which was biopsied and positive for H. pylori.  She was treated with Pylera . H. pylori stool test/eradication study 08/30/2023 was negative.  Patient unfortunately had a poor prep for her colonoscopy and will need to have this repeated.  Patient states she has been taking Linzess  290 mcg once daily and has noticed that her stool is softer, but she is still having a bowel movement only once a week at best.  She can go longer than this.  She has been taking Senokot in addition to the Linzess , and if she goes more than a week without a bowel movement she will take MiraLAX  as well, but is not taking this on a consistent basis.  She denies hard stools, straining with bowel movements, bloody stools, or melena.  She does tell me that her stools were black prior to completing treatment for H. Pylori but that they  are now normal/brown.  She denies any diarrhea or loose stools.  She continues to have intermittent abdominal pain on the right side of her abdomen, primarily in the right upper quadrant.  Pain may be absent for a week or 2 and then returns.  It is sharp and stabbing in nature.  She has noticed that it tends to improve after a bowel movement.  Patient has had heartburn which she describes as a burning chest pain that occurs about once a week and seems to be related to her diet.  She is not currently on acid suppression, and stopped taking omeprazole  after treatment for H. pylori was completed.  Additionally, she has had early satiety since the beginning of this year which has not improved despite H. pylori eradication.  She denies nausea or vomiting.  Previous GI Procedures   EGD 07/12/2023 - Normal esophagus.  - Gastritis. Biopsied.  - Normal cardia and gastric fundus.  - Normal duodenal bulb and second portion of the duodenum.  FINAL DIAGNOSIS        1. Surgical [P], gastric :       - GASTRIC ANTRAL AND OXYNTIC MUCOSA WITH HELICOBACTER PYLORI-ASSOCIATED       GASTRITIS       - HELICOBACTER PYLORI-LIKE ORGANISMS WERE IDENTIFIED ON ROUTINE H&E STAIN       - FOCAL INTESTINAL METAPLASIA OF ANTRAL MUCOSA, NEGATIVE FOR DYSPLASIA   Colonoscopy 07/12/2023 - Preparation of the colon was inadequate.  - Hemorrhoids found on perianal exam.  - Stool in the recto-sigmoid  colon and in the descending colon.  - No specimens collected. - Repeat colonoscopy at the next available appointment because the bowel preparation was poor.  Patient needs a 2-day bowel prep for next colonoscopy.  CT A/P 06/18/2023 IMPRESSION: 1. Suboptimal contrast bolus limits evaluation of the pulmonary arteries but no large central pulmonary emboli are visualized. 2. Bronchitic changes demonstrated in the right lower lung. No focal lung consolidations. 3. Right adrenal gland nodules as detailed above are  indeterminate. Follow-up examination in 1 year suggested. 4. Aortic atherosclerosis.  CT A/P 08/13/2023 IMPRESSION: *No acute findings in the abdomen. 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 *Abundant residual fecal material throughout the visualized portions of the colon.   Past Medical History:  Diagnosis Date   Arthritis 2000   Diabetes mellitus without complication (HCC) 2005   Hemorrhoids    Hypercholesteremia 2005   Hypertension 2005   Sleep apnea 2010   Sleep disorder     Past Surgical History:  Procedure Laterality Date   CARDIAC CATHETERIZATION  10/18/2007   Normal coronaries, systemic hypertension    Current Outpatient Medications  Medication Sig Dispense Refill   amLODipine  (NORVASC ) 10 MG tablet TAKE 1 TABLET(10 MG) BY MOUTH DAILY 90 tablet 0   atorvastatin  (LIPITOR) 80 MG tablet TAKE 1 TABLET(80 MG) BY MOUTH DAILY 90 tablet 0   clobetasol  cream (TEMOVATE ) 0.05 % Apply 1 Application topically as needed. 45 g 0   ENBREL SURECLICK 50 MG/ML injection Inject into the skin.     Etanercept 50 MG/ML SOCT Inject into the skin.     folic acid  (FOLVITE ) 1 MG tablet Take 1 mg by mouth daily.     gabapentin  (NEURONTIN ) 100 MG capsule TAKE 1 CAPSULE(100 MG) BY MOUTH THREE TIMES DAILY 90 capsule 3   hydrochlorothiazide  (HYDRODIURIL ) 25 MG tablet TAKE 1 TABLET(25 MG) BY MOUTH DAILY 30 tablet 2   ketoconazole  (NIZORAL ) 2 % cream APPLY TOPICALLY TO THE AFFECTED AREA TWICE DAILY 15 g 0   linaclotide  (LINZESS ) 290 MCG CAPS capsule Take 1 capsule (290 mcg total) by mouth daily before breakfast. If you have diarrhea, please contact the [place] MD office directly for recommendations. 30 capsule 3   lisinopril  (ZESTRIL ) 40 MG tablet TAKE 1 TABLET(40 MG) BY MOUTH DAILY 90 tablet 3   metFORMIN  (GLUCOPHAGE ) 1000 MG tablet TAKE 1 TABLET(1000 MG) BY MOUTH TWICE DAILY WITH A MEAL 180 tablet 0   methotrexate  (RHEUMATREX) 2.5 MG tablet TAKE 6 TABLETS(15 MG) BY MOUTH 1 TIME A WEEK      omeprazole  (PRILOSEC) 40 MG capsule Take 1 capsule (40 mg total) by mouth daily for 10 days. 10 capsule 0   polyethylene glycol powder (GLYCOLAX /MIRALAX ) 17 GM/SCOOP powder Take 17 g by mouth 2 (two) times daily as needed. (Patient not taking: Reported on 06/18/2023) 3350 g 1   PYLERA  140-125-125 MG CAPS Take 3 capsules by mouth 4 (four) times daily - after meals and at bedtime. 120 capsule 0   senna (SENOKOT) 8.6 MG TABS tablet Take 2 tablets (17.2 mg total) by mouth daily. 120 tablet 0   SUFLAVE  178.7 g SOLR Take by mouth as directed. (Patient not taking: Reported on 06/18/2023)     No current facility-administered medications for this visit.    Allergies as of 09/21/2023 - Review Complete 08/24/2023  Allergen Reaction Noted   Tylenol  [acetaminophen ] Other (See Comments) 11/26/2017    Family History  Problem Relation Age of Onset   Heart attack Mother    Diabetes Mother  Hypercholesterolemia Mother    Hypertension Mother    Diabetes Sister    Hypertension Sister    Hypertension Brother    Hypertension Brother    Alzheimer's disease Maternal Grandmother    Colon cancer Neg Hx    Crohn's disease Neg Hx    Esophageal cancer Neg Hx    Inflammatory bowel disease Neg Hx    Rectal cancer Neg Hx     Social History   Tobacco Use   Smoking status: Former    Current packs/day: 0.00    Types: Cigarettes    Quit date: 12/27/2012    Years since quitting: 10.7   Smokeless tobacco: Never  Vaping Use   Vaping status: Never Used  Substance Use Topics   Alcohol use: No   Drug use: No     Review of Systems:    Constitutional: No weight loss, fever, chills, weakness or fatigue HEENT: Eyes: No change in vision Ears, Nose, Throat:  No change in hearing or congestion Skin: No rash or itching Cardiovascular: No chest pain, chest pressure or palpitations   Respiratory: No SOB or cough Gastrointestinal: See HPI and otherwise negative Genitourinary: No dysuria or change in urinary  frequency Neurological: No headache, dizziness or syncope Musculoskeletal: No new muscle or joint pain Hematologic: No bleeding or bruising    Physical Exam:  Vital signs: BP 114/76   Pulse 82   Ht 5\' 7"  (1.702 m)   Wt 224 lb 6.4 oz (101.8 kg)   BMI 35.15 kg/m    Constitutional: NAD, Well developed, Well nourished, alert and cooperative Head:  Normocephalic and atraumatic. Eyes:  EOMs intact. No scleral icterus. Conjunctiva pink. Respiratory: Respirations even and unlabored. Lungs clear to auscultation bilaterally.  No wheezes, crackles, or rhonchi.  Cardiovascular:  Regular rate and rhythm. No peripheral edema, cyanosis or pallor.  Gastrointestinal:  Soft, nondistended, tender to palpation of right side of abdomen, more so in the RUQ. No rebound or guarding. Decreased bowel sounds. No appreciable masses or hepatomegaly. Rectal:  Not performed.  Msk:  Symmetrical without gross deformities. Without edema, no deformity or joint abnormality.  Neurologic:  Alert and oriented x4;  grossly normal neurologically.  Skin:   Dry and intact without significant lesions or rashes. Psychiatric: Oriented to person, place and time. Demonstrates good judgement and reason without abnormal affect or behaviors.   RELEVANT LABS AND IMAGING: CBC    Component Value Date/Time   WBC 8.7 08/24/2023 1029   WBC 11.4 (H) 06/18/2023 1037   RBC 4.60 08/24/2023 1029   RBC 4.61 06/18/2023 1037   HGB 13.4 08/24/2023 1029   HCT 40.5 08/24/2023 1029   PLT 320 08/24/2023 1029   MCV 88 08/24/2023 1029   MCH 29.1 08/24/2023 1029   MCH 28.6 06/18/2023 1037   MCHC 33.1 08/24/2023 1029   MCHC 34.1 06/18/2023 1037   RDW 14.0 08/24/2023 1029   LYMPHSABS 3.1 08/24/2023 1029   MONOABS 0.6 06/18/2023 1037   EOSABS 0.5 (H) 08/24/2023 1029   BASOSABS 0.0 08/24/2023 1029    CMP     Component Value Date/Time   NA 138 08/24/2023 1033   K 4.2 08/24/2023 1033   CL 100 08/24/2023 1033   CO2 21 08/24/2023 1033    GLUCOSE 95 08/24/2023 1033   GLUCOSE 106 (H) 06/18/2023 1037   BUN 9 08/24/2023 1033   CREATININE 0.79 08/24/2023 1033   CREATININE 0.68 04/27/2016 1123   CALCIUM  10.2 08/24/2023 1033   PROT 7.8 08/24/2023 1033  ALBUMIN 4.8 08/24/2023 1033   AST 25 08/24/2023 1033   ALT 20 08/24/2023 1033   ALKPHOS 109 08/24/2023 1033   BILITOT 0.6 08/24/2023 1033   GFRNONAA >60 06/18/2023 1037   GFRNONAA >89 04/27/2016 1123   GFRAA 108 12/19/2019 0946   GFRAA >89 04/27/2016 1123     Assessment/Plan:   Constipation  Patient with longstanding history of constipation, stools softer on Linzess  but still only having a bowel movement once a week at best, and needing additional OTC stool softeners. - Will stop Linzess  and switch to Trulance 3mg  daily. - Can add Miralax  1-2 capfuls daily. - Can add stool softener if inadequate response. - If this regimen is not effective, could consider Motegrity or Ibsrela.  Abdominal pain Patient with 10+ years of right-sided abdominal pain, primarily RUQ.  Pain comes and goes, may occur once a week but can be severe, sharp, stabbing pain.  She has noticed relief of pain after a bowel movement.  Possible this is related to her chronic constipation. She is unsure if there is any association with eating. -Patient instructed to monitor her pain symptoms in relation to meals.  If there is an association, would consider HIDA scan in future to rule out biliary dyskinesia.  H. Pylori infection Treated, eradication study negative.  GERD Patient reports having heartburn about once a week.  She is not currently taking anything for this, stopped taking omeprazole  after treatment for H. pylori was complete. - Start omeprazole  40mg  once daily  Colon cancer screening Patient states she completed a 2-day prep for recent colonoscopy, but bowel preparation was poor.   - Plan for repeat colonoscopy with 2-day prep, with addition of magnesium  citrate the week leading up to  procedure (two 4oz doses daily for 7 days).   Valiant Gaul, PA-C [facility] 09/21/2023, 8:12 AM  I have reviewed the clinic note as outlined by Valiant Gaul, PA and agree with the assessment, plan and medical decision making.  Tanya Owens returns to the office today for follow-up of abdominal pain, constipation and recent H. pylori infection.  She underwent EGD and colonoscopy to 2025.  EGD diagnosed H. pylori gastritis treated with Pylera .  Test of cure is negative.  Colonoscopy showed suboptimal bowel preparation necessitating aborting the procedure.  States she did a 2-day bowel prep prior to colonoscopy.  Advised that she be rescheduled with a 2-day bowel prep and magnesium  citrate for 7 days before the procedure as outlined above.  We discussed optimizing her constipation regimen.  She has not found Linzess  helpful.  States she will still only stool once a week.  We will transition to Trulance 3 mg daily and can take Ex-Lax, MiraLAX  and stool softeners in conjunction with this.  She continues to experience upper abdominal pain which could be constipation related or due to gastritis.  Advise restarting her PPI.  If pain persists despite ameliorating constipation and trialing PPI can consider HIDA scan.  Eugenia Hess, MD   Patient Care Team: Ernestina Headland, MD as PCP - General (Family Medicine) Luana Rumple, MD as PCP - Cardiology (Cardiology)

## 2023-09-21 NOTE — Telephone Encounter (Signed)
 Pharmacy Patient Advocate Encounter   Received notification from Patient Pharmacy that prior authorization for Trulance 3MG  tablets is required/requested.   Insurance verification completed.   The patient is insured through [facility] [place] .   Per test claim: PA required; PA submitted to above mentioned insurance via CoverMyMeds Key/confirmation #/EOC [id number] Status is pending

## 2023-09-21 NOTE — Telephone Encounter (Signed)
 Pharmacy Patient Advocate Encounter  Received notification from [facility] Medicaid that Prior Authorization for Trulance 3MG  tablets has been DENIED.  Full denial letter will be uploaded to the media tab. See denial reason below.  Per the health plan's preferred drug list, at least 2 preferred drugs must be tried before requesting this drug or tell us  why the member cannot try any preferred alternatives. Here is a list of preferred alternatives: Amitiza capsule, lubiprostone capsule. Er our records, the member has already tried Linzess  capsule.  PA #/Case ID/Reference #: [id number]

## 2023-09-22 ENCOUNTER — Telehealth: Payer: Self-pay

## 2023-09-22 MED ORDER — LUBIPROSTONE 24 MCG PO CAPS: 24.0000 ug | ORAL_CAPSULE | Freq: Two times a day (BID) | ORAL | 3 refills | Status: DC

## 2023-09-22 NOTE — Telephone Encounter (Signed)
 Dr. Yvone Herd, this denial for Trulance was routed to me.  Is there another medication that you would like to try for this patient?  It looks like she has to fail 2 other covered medications before going to Trulance.  We discontinued her Linzess  09/21/23.

## 2023-09-22 NOTE — Telephone Encounter (Signed)
 I spoke to [place] and I explained that since the Trulance was denied by her insurance company Dr. Yvone Herd sent in an alternate medication.  Documented in telephone note.

## 2023-09-22 NOTE — Telephone Encounter (Signed)
 I spoke to [place] and I advised her that her insurance denied the Trulance medication that Dr. Yvone Herd sent in.  She said that she will ask the pharmacy how much the out of pocket is.  I told her that Dr. Yvone Herd is sending a substitute, Amitiza 24 mcg for her to take twice a day.  I explained that if she doesn't notice any difference after 2 weeks, she should let us  know so that we can resend the medication request to her pharmacy.  Patient agreed with the plan of care.

## 2023-09-27 DIAGNOSIS — Z79899 Other long term (current) drug therapy: Secondary | ICD-10-CM | POA: Diagnosis not present

## 2023-09-27 DIAGNOSIS — M0579 Rheumatoid arthritis with rheumatoid factor of multiple sites without organ or systems involvement: Secondary | ICD-10-CM | POA: Diagnosis not present

## 2023-09-27 DIAGNOSIS — Z419 Encounter for procedure for purposes other than remedying health state, unspecified: Secondary | ICD-10-CM | POA: Diagnosis not present

## 2023-09-28 ENCOUNTER — Ambulatory Visit: Payer: Self-pay | Admitting: Student

## 2023-09-28 ENCOUNTER — Other Ambulatory Visit: Payer: Self-pay | Admitting: Student

## 2023-09-28 DIAGNOSIS — E119 Type 2 diabetes mellitus without complications: Secondary | ICD-10-CM

## 2023-09-28 NOTE — Progress Notes (Deleted)
    SUBJECTIVE:   CHIEF COMPLAINT / HPI:   Chronic Abdominal Pain Follow Up:  - >10 year history of RUQ abdominal pain  - Early satiety and weight loss reported last visit  - saw GI 09/21/2023, previously with EGD treated for H Pylori, eradication testing performed and negative - Needs repeat colonoscopy as previous had poor prep - Started on PPI, on Miralax , started Amitiza  24 mcg BID with GI  Adrenal Mass Follow up: - Sent to Endo  - labs thus far unremarkable for adrenal hormonal over/under-secretion   PERTINENT  PMH / PSH:  HTN - hydrochlorothiazide , amlodipine , and Lisinopril  DM2 - On Metformin , Gabapentin  Plantar fasciitis  HLD - Lipitor  Obesity  RA - Follows rheumatology on MTX H Pylori, resolved with tx  OBJECTIVE:   There were no vitals taken for this visit.  General: Alert and oriented in no apparent distress Heart: Regular rate and rhythm with no murmurs appreciated Lungs: CTA bilaterally, no wheezing Abdomen: Bowel sounds present, no abdominal pain Skin: Warm and dry Extremities: No lower extremity edema   ASSESSMENT/PLAN:   Assessment & Plan      Ernestina Headland, MD [facility]

## 2023-10-08 ENCOUNTER — Other Ambulatory Visit: Payer: Self-pay | Admitting: Pediatrics

## 2023-10-08 ENCOUNTER — Other Ambulatory Visit: Payer: Self-pay | Admitting: Student

## 2023-10-08 DIAGNOSIS — E1140 Type 2 diabetes mellitus with diabetic neuropathy, unspecified: Secondary | ICD-10-CM

## 2023-10-22 ENCOUNTER — Telehealth: Payer: Self-pay | Admitting: Pediatrics

## 2023-10-22 NOTE — Telephone Encounter (Signed)
 Good afternoon Dr. Yvone Herd, I received a call from this patient requesting to cancel her appointment due to her not being able to find a care  partner and not having transportation. Patient stated that she will call back to reschedule once she find out who can come with her and stay with her. Please advise.

## 2023-10-26 ENCOUNTER — Encounter: Payer: Self-pay | Admitting: *Deleted

## 2023-10-27 ENCOUNTER — Encounter: Admitting: Pediatrics

## 2023-11-04 ENCOUNTER — Other Ambulatory Visit: Payer: Self-pay | Admitting: Student

## 2023-11-04 DIAGNOSIS — I10 Essential (primary) hypertension: Secondary | ICD-10-CM

## 2023-11-16 ENCOUNTER — Other Ambulatory Visit: Payer: Self-pay | Admitting: Student

## 2023-11-16 DIAGNOSIS — I10 Essential (primary) hypertension: Secondary | ICD-10-CM

## 2023-11-22 ENCOUNTER — Ambulatory Visit: Payer: Self-pay

## 2023-11-22 ENCOUNTER — Other Ambulatory Visit: Payer: Self-pay

## 2023-11-22 ENCOUNTER — Ambulatory Visit

## 2023-11-22 VITALS — BP 125/74 | HR 92 | Ht 67.0 in | Wt 224.8 lb

## 2023-11-22 DIAGNOSIS — L304 Erythema intertrigo: Secondary | ICD-10-CM

## 2023-11-22 DIAGNOSIS — E1140 Type 2 diabetes mellitus with diabetic neuropathy, unspecified: Secondary | ICD-10-CM | POA: Diagnosis present

## 2023-11-22 DIAGNOSIS — J0110 Acute frontal sinusitis, unspecified: Secondary | ICD-10-CM

## 2023-11-22 DIAGNOSIS — E119 Type 2 diabetes mellitus without complications: Secondary | ICD-10-CM

## 2023-11-22 DIAGNOSIS — I10 Essential (primary) hypertension: Secondary | ICD-10-CM

## 2023-11-22 LAB — POCT GLYCOSYLATED HEMOGLOBIN (HGB A1C): Hemoglobin A1C: 6.6 % — AB (ref 4.0–5.6)

## 2023-11-22 MED ORDER — GABAPENTIN 100 MG PO CAPS: ORAL_CAPSULE | ORAL | 3 refills | Status: AC

## 2023-11-22 MED ORDER — KETOCONAZOLE 2 % EX CREA: TOPICAL_CREAM | CUTANEOUS | 0 refills | Status: AC

## 2023-11-22 MED ORDER — LISINOPRIL 40 MG PO TABS: 40.0000 mg | ORAL_TABLET | Freq: Every day | ORAL | 3 refills | Status: AC

## 2023-11-22 MED ORDER — ATORVASTATIN CALCIUM 80 MG PO TABS: 80.0000 mg | ORAL_TABLET | Freq: Every day | ORAL | 2 refills | Status: DC

## 2023-11-22 MED ORDER — AMOXICILLIN-POT CLAVULANATE 875-125 MG PO TABS: 1.0000 | ORAL_TABLET | Freq: Two times a day (BID) | ORAL | 0 refills | Status: DC

## 2023-11-22 NOTE — Patient Instructions (Signed)
 Good to see you today - Thank you for coming in  Things we discussed today:  Diabetes: A1c was up to 6.6, try to avoid breakfast cereals in the morning. Eat parfaits and oatmeal instead.   Constipation: see your gastrointerologist regarding your new medications and bowel. Discuss the Mg citrate with your GI doctor.  Sinusitis: Take 1 tablet augmentin  every morning and night  for 5 days.  If you're symptoms get worse, fevers, nausea, vomiting go to an ED or urgent care.  Please always bring your medication bottles  Come back to see me in 3 months  Dr. Fairy Amy, MD [facility] Family Medicine Resident, PGY-1

## 2023-11-22 NOTE — Progress Notes (Signed)
 rep   SUBJECTIVE:   CHIEF COMPLAINT / HPI:   IIDM2: A1c < 7 for the last year.  Reports eating roasted flakes for breakfast, discussed breakfast alternatives including parfaits, and oatmeal.  Discussed importance of physical activity and exercise.  Peripheral neuropathy is well-controlled, denies worsening of burning pain or numbness of feet, well-controlled with compression stockings and gabapentin   Constipation, irritable bowel syndrome: Saw GI provider in May, Linzess  to Trulance .  Reports feeling increased fatigue since that was wondering if it was the medication.  Bowel movements have increased once a week to twice a week.  Stools are normal appearing in consistency,  No blood, foul odor, N./V.  Reports taking all bowel regimen medications, but is having a hard time keeping track of them.  Had a colonoscopy scheduled due to inadequate bowel prep, canceled for transportation problems.  Sinusitis: Increased head pressure with headache whooshing sound in ears over the last week.  No improvement with taking allergy medication, positive for postnasal drip, cough, rhinorrhea, and congestion.  No fevers.  PERTINENT  PMH / PSH: Rheumatoid arthritis, irritable bowel syndrome  OBJECTIVE:   BP 125/74   Pulse 92   Ht 5' 7 (1.702 m)   Wt 224 lb 12.8 oz (102 kg)   SpO2 100%   BMI 35.21 kg/m     Physical Exam HENT:     Right Ear: Tympanic membrane, ear canal and external ear normal.     Left Ear: Tympanic membrane, ear canal and external ear normal.     Nose: Congestion and rhinorrhea present.     Mouth/Throat:     Mouth: Mucous membranes are moist.     Pharynx: Posterior oropharyngeal erythema present. No oropharyngeal exudate.   Cardiac: Regular rate and rhythm. Normal S1/S2. No murmurs, rubs, or gallops appreciated. Lungs: Clear bilaterally to ascultation.  Abdomen: Normoactive bowel sounds. No tenderness to deep or light palpation. No rebound or guarding.    Psych: Pleasant and  appropriate    ASSESSMENT/PLAN:   Assessment & Plan Type 2 diabetes mellitus with diabetic neuropathy, without long-term current use of insulin (HCC) Continue dietary modifications, including yogurt and oatmeal substitute for breakfast cereals.  Continue current metformin  dose and encouraged increasing physical activity.  Discussed sugar substitutes and coffee, and encouraged to simply reduce quantity of sugar.  Normal diabetic foot exam Essential hypertension Refilled lisinopril , normal BPs today Acute non-recurrent frontal sinusitis Prescribed 5-day course of Augmentin  twice daily, due to immunocompromise state. Intertrigo Refilled ketoconazole  topical cream, for recurrent rash groin skin folds     Tanya Amy, MD [facility]

## 2023-11-22 NOTE — Progress Notes (Incomplete)
     SUBJECTIVE:   CHIEF COMPLAINT / HPI:   Tanya Owens presents today for hospital follow up.   Hospitalized at *** from *** to ***, for ***.  Since discharge, patient reports ***   PERTINENT  PMH / PSH: ***  OBJECTIVE:   There were no vitals taken for this visit.  ***  ASSESSMENT/PLAN:   Assessment & Plan      Fairy Amy, MD [facility]

## 2023-11-22 NOTE — Assessment & Plan Note (Addendum)
 Continue dietary modifications, including yogurt and oatmeal substitute for breakfast cereals.  Continue current metformin  dose and encouraged increasing physical activity.  Discussed sugar substitutes and coffee, and encouraged to simply reduce quantity of sugar.  Normal diabetic foot exam

## 2023-12-18 ENCOUNTER — Other Ambulatory Visit: Payer: Self-pay | Admitting: Pediatrics

## 2023-12-23 ENCOUNTER — Other Ambulatory Visit: Payer: Self-pay

## 2023-12-23 DIAGNOSIS — E119 Type 2 diabetes mellitus without complications: Secondary | ICD-10-CM

## 2023-12-23 MED ORDER — METFORMIN HCL 1000 MG PO TABS
ORAL_TABLET | ORAL | 0 refills | Status: DC
Start: 2023-12-23 — End: 2024-03-25

## 2023-12-23 NOTE — Telephone Encounter (Signed)
 Chart reviewed  -Fairy Amy, MD

## 2024-01-04 ENCOUNTER — Ambulatory Visit (INDEPENDENT_AMBULATORY_CARE_PROVIDER_SITE_OTHER): Admitting: "Endocrinology

## 2024-01-04 ENCOUNTER — Encounter: Payer: Self-pay | Admitting: "Endocrinology

## 2024-01-04 VITALS — BP 118/80 | HR 84 | Ht 67.0 in | Wt 224.0 lb

## 2024-01-04 DIAGNOSIS — E1165 Type 2 diabetes mellitus with hyperglycemia: Secondary | ICD-10-CM

## 2024-01-04 DIAGNOSIS — Z7984 Long term (current) use of oral hypoglycemic drugs: Secondary | ICD-10-CM | POA: Diagnosis not present

## 2024-01-04 DIAGNOSIS — D3501 Benign neoplasm of right adrenal gland: Secondary | ICD-10-CM

## 2024-01-04 DIAGNOSIS — Z794 Long term (current) use of insulin: Secondary | ICD-10-CM

## 2024-01-04 DIAGNOSIS — E7800 Pure hypercholesterolemia, unspecified: Secondary | ICD-10-CM

## 2024-01-04 MED ORDER — BAQSIMI ONE PACK 3 MG/DOSE NA POWD
1.0000 | NASAL | 3 refills | Status: DC | PRN
Start: 2024-01-04 — End: 2024-01-04

## 2024-01-04 MED ORDER — NOVOLOG FLEXPEN 100 UNIT/ML ~~LOC~~ SOPN: PEN_INJECTOR | SUBCUTANEOUS | 3 refills | Status: DC

## 2024-01-04 MED ORDER — FREESTYLE LIBRE 3 PLUS SENSOR MISC
3 refills | Status: DC
Start: 2024-01-04 — End: 2024-01-04

## 2024-01-04 NOTE — Addendum Note (Signed)
 Addended by: Teigan Sahli on: 01/04/2024 09:41 AM   Modules accepted: Orders

## 2024-01-04 NOTE — Addendum Note (Signed)
 Addended by: Kord Monette on: 01/04/2024 09:25 AM   Modules accepted: Orders

## 2024-01-04 NOTE — Progress Notes (Addendum)
 Outpatient Endocrinology Note Tanya Birmingham, MD  01/04/24   Tanya Owens 10-31-1960 [id number]  Referring Provider: Madelon Donald HERO, DO Primary Care Provider: Lorrane Pac, MD Reason for consultation: Subjective   Assessment & Plan  Diagnoses and all orders for this visit:  Adenoma of right adrenal gland -     DHEA-sulfate -     ACTH -     Cortisol -     Metanephrines, plasma -     Aldosterone + renin activity w/ ratio -     Basic metabolic panel with GFR  Uncontrolled type 2 diabetes mellitus with hyperglycemia (HCC) -     Cancel: Ambulatory referral to diabetic education  Long term (current) use of oral hypoglycemic drugs  Long-term insulin use (HCC)  Pure hypercholesterolemia  Other orders -     Discontinue: Continuous Glucose Sensor (FREESTYLE LIBRE 3 PLUS SENSOR) MISC; Change sensor every 15 days. -     Discontinue: insulin aspart  (NOVOLOG  FLEXPEN) 100 UNIT/ML FlexPen; 2 units three times a day 15 min before meals plus correction scale, max dose 30 units/day -     Discontinue: Glucagon  (BAQSIMI  ONE PACK) 3 MG/DOSE POWD; Place 1 Device into the nose as needed (Low blood sugar with impaired consciousness).  No concerning features for Cushing's such as dorsocervical/supraclavicular fat pad/purple striae/cushingoid features or facies 08/13/2023 CT ABDOMEN WITHOUT CONTRAST  Adrenals/Urinary Tract: The right adrenal gland demonstrates no significant change compared with prior examination again noted is the presence of a well-defined, low-attenuation right adrenal gland mass that measures 3 x 2.8 cm with a mean attenuation value of 10 Hounsfield units. Similar to prior examination may represent adenoma. Stable since prior examination. The second more superior nodule in the right adrenal gland measures 2.2 x 1.7 cm also unchanged since prior examination. No other masses Ordered baseline 8 AM adrenal labs  Diabetes Type II complicated by neuropathy, No results  found for: GFR Hba1c goal less than 7, current Hba1c is  Lab Results  Component Value Date   HGBA1C 6.6 (A) 11/22/2023   Will recommend the following: Metformin  1000 mg bid  No known contraindications/side effects to any of above medications  -Last LD and Tg are as follows: Lab Results  Component Value Date   LDLCALC 70 03/22/2023    Lab Results  Component Value Date   TRIG 93 03/22/2023   -On atorvastatin  80 mg QD -Follow low fat diet and exercise   -Blood pressure goal <140/90 - Microalbumin/creatinine goal is < 30 -Last MA/Cr is as follows: Lab Results  Component Value Date   MICROALBUR 80 09/23/2016   -on ACE/ARB lisinopril  40 mg every day  -diet changes including salt restriction -limit eating outside -counseled BP targets per standards of diabetes care -uncontrolled blood pressure can lead to retinopathy, nephropathy and cardiovascular and atherosclerotic heart disease  Reviewed and counseled on: -A1C target -Blood sugar targets -Complications of uncontrolled diabetes  -Checking blood sugar before meals and bedtime and bring log next visit -All medications with mechanism of action and side effects -Hypoglycemia management: rule of 15's, Glucagon  Emergency Kit and medical alert ID -low-carb low-fat plate-method diet -At least 20 minutes of physical activity per day -Annual dilated retinal eye exam and foot exam -compliance and follow up needs -follow up as scheduled or earlier if problem gets worse  Call if blood sugar is less than 70 or consistently above 250    Take a 15 gm snack of carbohydrate at bedtime before you go to  sleep if your blood sugar is less than 100.    If you are going to fast after midnight for a test or procedure, ask your physician for instructions on how to reduce/decrease your insulin dose.    Call if blood sugar is less than 70 or consistently above 250  -Treating a low sugar by rule of 15  (15 gms of sugar every 15 min until  sugar is more than 70) If you feel your sugar is low, test your sugar to be sure If your sugar is low (less than 70), then take 15 grams of a fast acting Carbohydrate (3-4 glucose tablets or glucose gel or 4 ounces of juice or regular soda) Recheck your sugar 15 min after treating low to make sure it is more than 70 If sugar is still less than 70, treat again with 15 grams of carbohydrate          Don't drive the hour of hypoglycemia  If unconscious/unable to eat or drink by mouth, use glucagon  injection or nasal spray baqsimi  and call 911. Can repeat again in 15 min if still unconscious.  Return in about 6 weeks (around 02/16/2024) for visit and 8 am labs before next visit.   I have reviewed current medications, nurse's notes, allergies, vital signs, past medical and surgical history, family medical history, and social history for this encounter. Counseled patient on symptoms, examination findings, lab findings, imaging results, treatment decisions and monitoring and prognosis. The patient understood the recommendations and agrees with the treatment plan. All questions regarding treatment plan were fully answered.  Tanya Birmingham, MD  01/04/24   History of Present Illness Tanya Owens is a 63 y.o. year old female who presents for evaluation of Type II diabetes mellitus and Right adrenal adenoma.  08/13/2023 CT ABDOMEN WITHOUT CONTRAST  Adrenals/Urinary Tract: The right adrenal gland demonstrates no significant change compared with prior examination again noted is the presence of a well-defined, low-attenuation right adrenal gland mass that measures 3 x 2.8 cm with a mean attenuation value of 10 Hounsfield units. Similar to prior examination may represent adenoma. Stable since prior examination. The second more superior nodule in the right adrenal gland measures 2.2 x 1.7 cm also unchanged since prior examination. No other masses  Tanya Owens was first diagnosed in 2015.   Diabetes  education -  Home diabetes regimen: Metformin  1000 mg bid  COMPLICATIONS -  MI/Stroke -  retinopathy +  neuropathy -  nephropathy  SYMPTOMS REVIEWED + Polyuria, on diuretic  + Weight loss - Blurred vision  BLOOD SUGAR DATA 110-116 per recall Did not bring meter Checks 0-1 times/day; fasting   Physical Exam  BP 118/80   Pulse 84   Ht 5' 7 (1.702 m)   Wt 224 lb (101.6 kg)   SpO2 96%   BMI 35.08 kg/m    Constitutional: well developed, well nourished No concerning features for Cushing's such as dorsocervical/supraclavicular fat pad/purple striae/cushingoid features or facies Head: normocephalic, atraumatic Eyes: sclera anicteric, no redness Neck: supple Lungs: normal respiratory effort Neurology: alert and oriented Skin: dry, no appreciable rashes Musculoskeletal: no appreciable defects Psychiatric: normal mood and affect Diabetic Foot Exam - Simple   No data filed      Current Medications Patient's Medications  New Prescriptions   No medications on file  Previous Medications   AMLODIPINE  (NORVASC ) 10 MG TABLET    TAKE 1 TABLET(10 MG) BY MOUTH DAILY   AMOXICILLIN -CLAVULANATE (AUGMENTIN ) 875-125 MG TABLET  Take 1 tablet by mouth 2 (two) times daily.   ATORVASTATIN  (LIPITOR) 80 MG TABLET    Take 1 tablet (80 mg total) by mouth daily.   CLOBETASOL  CREAM (TEMOVATE ) 0.05 %    Apply 1 Application topically as needed.   ENBREL SURECLICK 50 MG/ML INJECTION    Inject into the skin.   ETANERCEPT 50 MG/ML SOCT    Inject into the skin.   FOLIC ACID  (FOLVITE ) 1 MG TABLET    Take 1 mg by mouth daily.   GABAPENTIN  (NEURONTIN ) 100 MG CAPSULE    TAKE 1 CAPSULE(100 MG) BY MOUTH THREE TIMES DAILY   HYDROCHLOROTHIAZIDE  (HYDRODIURIL ) 25 MG TABLET    TAKE 1 TABLET(25 MG) BY MOUTH DAILY   KETOCONAZOLE  (NIZORAL ) 2 % CREAM    APPLY TOPICALLY TO THE AFFECTED AREA TWICE DAILY   LISINOPRIL  (ZESTRIL ) 40 MG TABLET    Take 1 tablet (40 mg total) by mouth daily.   LUBIPROSTONE  (AMITIZA )  24 MCG CAPSULE    Take 1 capsule (24 mcg total) by mouth 2 (two) times daily with a meal.   METFORMIN  (GLUCOPHAGE ) 1000 MG TABLET    TAKE 1 TABLET(1000 MG) BY MOUTH TWICE DAILY WITH A MEAL   METHOTREXATE  (RHEUMATREX) 2.5 MG TABLET    TAKE 6 TABLETS(15 MG) BY MOUTH 1 TIME A WEEK   OMEPRAZOLE  (PRILOSEC) 40 MG CAPSULE    Take 1 capsule (40 mg total) by mouth daily before breakfast.   POLYETHYLENE GLYCOL POWDER (GLYCOLAX /MIRALAX ) 17 GM/SCOOP POWDER    Take 17 g by mouth 2 (two) times daily as needed.   PYLERA  140-125-125 MG CAPS    Take 3 capsules by mouth 4 (four) times daily - after meals and at bedtime.   SENNA (SENOKOT) 8.6 MG TABS TABLET    Take 2 tablets (17.2 mg total) by mouth daily.  Modified Medications   No medications on file  Discontinued Medications   No medications on file    Allergies Allergies  Allergen Reactions   Tylenol  [Acetaminophen ] Other (See Comments)    Stomach cramps     Past Medical History Past Medical History:  Diagnosis Date   Arthritis 2000   Diabetes mellitus without complication (HCC) 2005   Hemorrhoids    Hypercholesteremia 2005   Hypertension 2005   Sleep apnea 2010   Sleep disorder     Past Surgical History Past Surgical History:  Procedure Laterality Date   CARDIAC CATHETERIZATION  10/18/2007   Normal coronaries, systemic hypertension    Family History family history includes Alzheimer's disease in her maternal grandmother; Diabetes in her mother and sister; Heart attack in her mother; Hypercholesterolemia in her mother; Hypertension in her brother, brother, mother, and sister.  Social History Social History   Socioeconomic History   Marital status: Single    Spouse name: Not on file   Number of children: 5   Years of education: Not on file   Highest education level: Not on file  Occupational History   Not on file  Tobacco Use   Smoking status: Former    Current packs/day: 0.00    Types: Cigarettes    Quit date: 12/27/2012     Years since quitting: 11.0   Smokeless tobacco: Never  Vaping Use   Vaping status: Never Used  Substance and Sexual Activity   Alcohol use: No   Drug use: No   Sexual activity: Yes    Birth control/protection: Post-menopausal  Other Topics Concern   Not on file  Social History  Narrative   Ms. 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   Social Drivers of Corporate investment banker Strain: Not on file  Food Insecurity: Not on file  Transportation Needs: Not on file  Physical Activity: Not on file  Stress: Not on file  Social Connections: Not on file  Intimate Partner Violence: Not on file    Lab Results  Component Value Date   HGBA1C 6.6 (A) 11/22/2023   HGBA1C 6.0 08/24/2023   HGBA1C 6.2 11/23/2022   Lab Results  Component Value Date   CHOL 133 03/22/2023   Lab Results  Component Value Date   HDL 45 03/22/2023   Lab Results  Component Value Date   LDLCALC 70 03/22/2023   Lab Results  Component Value Date   TRIG 93 03/22/2023   Lab Results  Component Value Date   CHOLHDL 3.0 03/22/2023   Lab Results  Component Value Date   CREATININE 0.79 08/24/2023   No results found for: GFR Lab Results  Component Value Date   MICROALBUR 80 09/23/2016      Component Value Date/Time   NA 138 08/24/2023 1033   K 4.2 08/24/2023 1033   CL 100 08/24/2023 1033   CO2 21 08/24/2023 1033   GLUCOSE 95 08/24/2023 1033   GLUCOSE 106 (H) 06/18/2023 1037   BUN 9 08/24/2023 1033   CREATININE 0.79 08/24/2023 1033   CREATININE 0.68 04/27/2016 1123   CALCIUM  10.2 08/24/2023 1033   PROT 7.8 08/24/2023 1033   ALBUMIN 4.8 08/24/2023 1033   AST 25 08/24/2023 1033   ALT 20 08/24/2023 1033   ALKPHOS 109 08/24/2023 1033   BILITOT 0.6 08/24/2023 1033   GFRNONAA >60 06/18/2023 1037   GFRNONAA >89 04/27/2016 1123   GFRAA 108  12/19/2019 0946   GFRAA >89 04/27/2016 1123      Latest Ref Rng & Units 08/24/2023   10:33 AM 06/18/2023   10:37 AM 06/01/2023    4:50 PM  BMP  Glucose 70 - 99 mg/dL 95  893  89   BUN 8 - 27 mg/dL 9  7  9    Creatinine 0.57 - 1.00 mg/dL 9.20  9.33  9.31   BUN/Creat Ratio 12 - 28 11     Sodium 134 - 144 mmol/L 138  136  138   Potassium 3.5 - 5.2 mmol/L 4.2  3.2  3.3   Chloride 96 - 106 mmol/L 100  100  105   CO2 20 - 29 mmol/L 21  24  27    Calcium  8.7 - 10.3 mg/dL 89.7  9.7  9.2        Component Value Date/Time   WBC 8.7 08/24/2023 1029   WBC 11.4 (H) 06/18/2023 1037   RBC 4.60 08/24/2023 1029   RBC 4.61 06/18/2023 1037   HGB 13.4 08/24/2023 1029   HCT 40.5 08/24/2023 1029   PLT 320 08/24/2023 1029   MCV 88 08/24/2023 1029   MCH 29.1 08/24/2023 1029   MCH 28.6 06/18/2023 1037   MCHC 33.1 08/24/2023 1029   MCHC 34.1 06/18/2023 1037   RDW 14.0 08/24/2023 1029   LYMPHSABS 3.1 08/24/2023 1029   MONOABS 0.6 06/18/2023 1037   EOSABS 0.5 (H) 08/24/2023 1029   BASOSABS 0.0 08/24/2023 1029     Parts of this note  may have been dictated using voice recognition software. There may be variances in spelling and vocabulary which are unintentional. Not all errors are proofread. Please notify the dino if any discrepancies are noted or if the meaning of any statement is not clear.

## 2024-02-01 ENCOUNTER — Other Ambulatory Visit: Payer: Self-pay

## 2024-02-07 ENCOUNTER — Other Ambulatory Visit: Payer: Self-pay

## 2024-02-07 DIAGNOSIS — I10 Essential (primary) hypertension: Secondary | ICD-10-CM

## 2024-02-08 MED ORDER — AMLODIPINE BESYLATE 10 MG PO TABS: 10.0000 mg | ORAL_TABLET | Freq: Every day | ORAL | 1 refills | Status: DC

## 2024-02-10 ENCOUNTER — Other Ambulatory Visit

## 2024-02-11 ENCOUNTER — Other Ambulatory Visit: Payer: Self-pay

## 2024-02-11 DIAGNOSIS — I10 Essential (primary) hypertension: Secondary | ICD-10-CM

## 2024-02-12 NOTE — Telephone Encounter (Signed)
 Chart reviewed  -Fairy Amy, MD

## 2024-02-17 ENCOUNTER — Ambulatory Visit: Admitting: "Endocrinology

## 2024-02-23 ENCOUNTER — Encounter: Payer: Self-pay | Admitting: Pediatrics

## 2024-02-25 LAB — METANEPHRINES, PLASMA
Metanephrine, Free: <25 pg/mL (ref <=57)
Normetanephrine, Free: 60 pg/mL (ref <=148)
Total Metanephrines-Plasma: 60 pg/mL (ref <=205)

## 2024-02-25 LAB — ALDOSTERONE + RENIN ACTIVITY W/ RATIO
ALDO / PRA Ratio: 8 ratio (ref 0.9–28.9)
Aldosterone: 2 ng/dL
Renin Activity: 0.25 ng/mL/h (ref 0.25–5.82)

## 2024-02-25 LAB — BASIC METABOLIC PANEL WITH GFR
BUN: 12 mg/dL (ref 7–25)
CO2: 25 mmol/L (ref 20–32)
Calcium: 9.3 mg/dL (ref 8.6–10.4)
Chloride: 100 mmol/L (ref 98–110)
Creat: 0.72 mg/dL (ref 0.50–1.05)
Glucose, Bld: 143 mg/dL — ABNORMAL HIGH (ref 65–99)
Potassium: 3.3 mmol/L — ABNORMAL LOW (ref 3.5–5.3)
Sodium: 138 mmol/L (ref 135–146)
eGFR: 93 mL/min/1.73m2 (ref >=60)

## 2024-02-25 LAB — DHEA-SULFATE: DHEA-SO4: 78 ug/dL (ref 9–118)

## 2024-02-25 LAB — CORTISOL: Cortisol, Plasma: 14 ug/dL

## 2024-02-25 LAB — ACTH: C206 ACTH: <5 pg/mL — ABNORMAL LOW (ref 6–50)

## 2024-03-24 ENCOUNTER — Other Ambulatory Visit: Payer: Self-pay

## 2024-03-24 DIAGNOSIS — E119 Type 2 diabetes mellitus without complications: Secondary | ICD-10-CM

## 2024-03-25 NOTE — Telephone Encounter (Signed)
 Chart reviewed  -Fairy Amy, MD

## 2024-04-11 ENCOUNTER — Other Ambulatory Visit

## 2024-04-11 ENCOUNTER — Ambulatory Visit (INDEPENDENT_AMBULATORY_CARE_PROVIDER_SITE_OTHER): Admitting: "Endocrinology

## 2024-04-11 ENCOUNTER — Encounter: Payer: Self-pay | Admitting: "Endocrinology

## 2024-04-11 VITALS — BP 124/80 | HR 67 | Ht 67.0 in | Wt 223.0 lb

## 2024-04-11 DIAGNOSIS — E1140 Type 2 diabetes mellitus with diabetic neuropathy, unspecified: Secondary | ICD-10-CM

## 2024-04-11 DIAGNOSIS — Z7984 Long term (current) use of oral hypoglycemic drugs: Secondary | ICD-10-CM

## 2024-04-11 DIAGNOSIS — E7800 Pure hypercholesterolemia, unspecified: Secondary | ICD-10-CM

## 2024-04-11 DIAGNOSIS — D3501 Benign neoplasm of right adrenal gland: Secondary | ICD-10-CM

## 2024-04-11 LAB — POCT GLYCOSYLATED HEMOGLOBIN (HGB A1C): Hemoglobin A1C: 5.8 % — AB (ref 4.0–5.6)

## 2024-04-11 MED ORDER — DEXAMETHASONE 1 MG PO TABS: 1.0000 mg | ORAL_TABLET | Freq: Once | ORAL | 0 refills | Status: AC

## 2024-04-11 MED ORDER — DEXAMETHASONE 1 MG PO TABS: 1.0000 mg | ORAL_TABLET | Freq: Once | ORAL | 0 refills | Status: DC

## 2024-04-11 NOTE — Progress Notes (Signed)
 Outpatient Endocrinology Note Tanya Birmingham, MD  04/11/24   [name] [birth year] [id number]  Referring Provider: Lorrane Pac, MD Primary Care Provider: Lorrane Pac, MD Reason for consultation: Subjective   Assessment & Plan  Diagnoses and all orders for this visit:  Adenoma of right adrenal gland -     dexamethasone  (DECADRON ) 1 MG tablet; Take 1 tablet (1 mg total) by mouth once for 1 dose. Take at 11 pm followed by 8 am blood work  Type 2 diabetes mellitus with diabetic neuropathy, without long-term current use of insulin (HCC) -     POCT glycosylated hemoglobin (Hb A1C) -     Microalbumin / creatinine urine ratio  Long term (current) use of oral hypoglycemic drugs  Other orders -     Discontinue: dexamethasone  (DECADRON ) 1 MG tablet; Take 1 tablet (1 mg total) by mouth once for 1 dose. Take at 11 pm followed by 8 am blood work  No concerning features for Cushing's such as dorsocervical/supraclavicular fat pad/purple striae/cushingoid features or facies 08/13/2023 CT ABDOMEN WITHOUT CONTRAST  Adrenals/Urinary Tract: The right adrenal gland demonstrates no significant change compared with prior examination again noted is the presence of a well-defined, low-attenuation right adrenal gland mass that measures 3 x 2.8 cm with a mean attenuation value of 10 Hounsfield units. Similar to prior examination may represent adenoma. Stable since prior examination. The second more superior nodule in the right adrenal gland measures 2.2 x 1.7 cm also unchanged since prior examination. No other masses 02/10/24: baseline 8 AM adrenal labs: WNL except undetectable ACTH ; ordered 1 mg dexamethasone  suppression test Component     Latest Ref Rng 02/10/2024  Metanephrine, Pl     <=57 pg/mL <25   Normetanephrine, Pl     <=148 pg/mL 60   Total Metanephrines-Plasma     <=205 pg/mL 60   ALDOSTERONE      ng/dL 2   Renin Activity     0.25 - 5.82 ng/mL/h 0.25   ALDO / PRA Ratio      0.9 - 28.9 Ratio 8.0   DHEA-SO4     9 - 118 mcg/dL 78   R793 ACTH      6 - 50 pg/mL <5 (L)   Cortisol, Plasma     mcg/dL 85.9     Diabetes Type II complicated by neuropathy, No results found for: GFR Hba1c goal less than 7, current Hba1c is  Lab Results  Component Value Date   HGBA1C 5.8 (A) 04/11/2024   Will recommend the following: Metformin  1000 mg bid  No known contraindications/side effects to any of above medications  -Last LD and Tg are as follows: Lab Results  Component Value Date   LDLCALC 70 03/22/2023    Lab Results  Component Value Date   TRIG 93 03/22/2023   -On atorvastatin  80 mg QD -Follow low fat diet and exercise   -Blood pressure goal <140/90 - Microalbumin/creatinine goal is < 30 -Last MA/Cr is as follows: Lab Results  Component Value Date   MICROALBUR 80 09/23/2016   -on ACE/ARB lisinopril  40 mg every day  -diet changes including salt restriction -limit eating outside -counseled BP targets per standards of diabetes care -uncontrolled blood pressure can lead to retinopathy, nephropathy and cardiovascular and atherosclerotic heart disease  Reviewed and counseled on: -A1C target -Blood sugar targets -Complications of uncontrolled diabetes  -Checking blood sugar before meals and bedtime and bring log next visit -All medications with mechanism of action and side effects -Hypoglycemia  management: rule of 15's, Glucagon  Emergency Kit and medical alert ID -low-carb low-fat plate-method diet -At least 20 minutes of physical activity per day -Annual dilated retinal eye exam and foot exam -compliance and follow up needs -follow up as scheduled or earlier if problem gets worse  Call if blood sugar is less than 70 or consistently above 250    Take a 15 gm snack of carbohydrate at bedtime before you go to sleep if your blood sugar is less than 100.    If you are going to fast after midnight for a test or procedure, ask your physician for  instructions on how to reduce/decrease your insulin dose.    Call if blood sugar is less than 70 or consistently above 250  -Treating a low sugar by rule of 15  (15 gms of sugar every 15 min until sugar is more than 70) If you feel your sugar is low, test your sugar to be sure If your sugar is low (less than 70), then take 15 grams of a fast acting Carbohydrate (3-4 glucose tablets or glucose gel or 4 ounces of juice or regular soda) Recheck your sugar 15 min after treating low to make sure it is more than 70 If sugar is still less than 70, treat again with 15 grams of carbohydrate          Don't drive the hour of hypoglycemia  If unconscious/unable to eat or drink by mouth, use glucagon  injection or nasal spray baqsimi  and call 911. Can repeat again in 15 min if still unconscious.  Return in about 2 weeks (around 04/25/2024) for visit and 8 am labs before next visit, urine test today.   I have reviewed current medications, nurse's notes, allergies, vital signs, past medical and surgical history, family medical history, and social history for this encounter. Counseled patient on symptoms, examination findings, lab findings, imaging results, treatment decisions and monitoring and prognosis. The patient understood the recommendations and agrees with the treatment plan. All questions regarding treatment plan were fully answered.  Tanya Birmingham, MD  04/11/24   History of Present Illness Tanya Owens is a 63 y.o. year old female who presents for follow up of Type II diabetes mellitus and Right adrenal adenoma.  Patient has no active complaints today.   08/13/2023 CT ABDOMEN WITHOUT CONTRAST  Adrenals/Urinary Tract: The right adrenal gland demonstrates no significant change compared with prior examination again noted is the presence of a well-defined, low-attenuation right adrenal gland mass that measures 3 x 2.8 cm with a mean attenuation value of 10 Hounsfield units. Similar to prior  examination may represent adenoma. Stable since prior examination. The second more superior nodule in the right adrenal gland measures 2.2 x 1.7 cm also unchanged since prior examination. No other masses  Tanya Owens was first diagnosed in 2015.   Diabetes education -  Home diabetes regimen: Metformin  1000 mg bid  COMPLICATIONS -  MI/Stroke -  retinopathy +  neuropathy -  nephropathy  SYMPTOMS REVIEWED + Polyuria, on diuretic  + Weight loss - Blurred vision  BLOOD SUGAR DATA 102-110 per recall Did not bring meter Checks 0-1 times/day; fasting   Physical Exam  BP 124/80   Pulse 67   Ht 5' 7 (1.702 m)   Wt 223 lb (101.2 kg)   SpO2 98%   BMI 34.93 kg/m    Constitutional: well developed, well nourished No concerning features for Cushing's such as dorsocervical/supraclavicular fat pad/purple striae/cushingoid features or facies Head:  normocephalic, atraumatic Eyes: sclera anicteric, no redness Neck: supple Lungs: normal respiratory effort Neurology: alert and oriented Skin: dry, no appreciable rashes Musculoskeletal: no appreciable defects Psychiatric: normal mood and affect Diabetic Foot Exam - Simple   No data filed      Current Medications Patient's Medications  New Prescriptions   DEXAMETHASONE  (DECADRON ) 1 MG TABLET    Take 1 tablet (1 mg total) by mouth once for 1 dose. Take at 11 pm followed by 8 am blood work  Previous Medications   AMLODIPINE  (NORVASC ) 10 MG TABLET    Take 1 tablet (10 mg total) by mouth daily.   AMOXICILLIN -CLAVULANATE (AUGMENTIN ) 875-125 MG TABLET    Take 1 tablet by mouth 2 (two) times daily.   ATORVASTATIN  (LIPITOR) 80 MG TABLET    Take 1 tablet (80 mg total) by mouth daily.   CLOBETASOL  CREAM (TEMOVATE ) 0.05 %    Apply 1 Application topically as needed.   ENBREL SURECLICK 50 MG/ML INJECTION    Inject into the skin.   ETANERCEPT 50 MG/ML SOCT    Inject into the skin.   FOLIC ACID  (FOLVITE ) 1 MG TABLET    Take 1 mg by mouth  daily.   GABAPENTIN  (NEURONTIN ) 100 MG CAPSULE    TAKE 1 CAPSULE(100 MG) BY MOUTH THREE TIMES DAILY   HYDROCHLOROTHIAZIDE  (HYDRODIURIL ) 25 MG TABLET    TAKE 1 TABLET(25 MG) BY MOUTH DAILY   KETOCONAZOLE  (NIZORAL ) 2 % CREAM    APPLY TOPICALLY TO THE AFFECTED AREA TWICE DAILY   LISINOPRIL  (ZESTRIL ) 40 MG TABLET    Take 1 tablet (40 mg total) by mouth daily.   LUBIPROSTONE  (AMITIZA ) 24 MCG CAPSULE    Take 1 capsule (24 mcg total) by mouth 2 (two) times daily with a meal.   METFORMIN  (GLUCOPHAGE ) 1000 MG TABLET    TAKE 1 TABLET(1000 MG) BY MOUTH TWICE DAILY WITH A MEAL   METHOTREXATE  (RHEUMATREX) 2.5 MG TABLET    TAKE 6 TABLETS(15 MG) BY MOUTH 1 TIME A WEEK   OMEPRAZOLE  (PRILOSEC) 40 MG CAPSULE    Take 1 capsule (40 mg total) by mouth daily before breakfast.   POLYETHYLENE GLYCOL POWDER (GLYCOLAX /MIRALAX ) 17 GM/SCOOP POWDER    Take 17 g by mouth 2 (two) times daily as needed.   PYLERA  140-125-125 MG CAPS    Take 3 capsules by mouth 4 (four) times daily - after meals and at bedtime.   SENNA (SENOKOT) 8.6 MG TABS TABLET    Take 2 tablets (17.2 mg total) by mouth daily.  Modified Medications   No medications on file  Discontinued Medications   No medications on file    Allergies Allergies  Allergen Reactions   Tylenol  [Acetaminophen ] Other (See Comments)    Stomach cramps     Past Medical History Past Medical History:  Diagnosis Date   Arthritis 2000   Diabetes mellitus without complication (HCC) 2005   Hemorrhoids    Hypercholesteremia 2005   Hypertension 2005   Sleep apnea 2010   Sleep disorder     Past Surgical History Past Surgical History:  Procedure Laterality Date   CARDIAC CATHETERIZATION  10/18/2007   Normal coronaries, systemic hypertension    Family History family history includes Alzheimer's disease in her maternal grandmother; Diabetes in her mother and sister; Heart attack in her mother; Hypercholesterolemia in her mother; Hypertension in her brother, brother,  mother, and sister.  Social History Social History   Socioeconomic History   Marital status: Single    Spouse name:  Not on file   Number of children: 5   Years of education: Not on file   Highest education level: Not on file  Occupational History   Not on file  Tobacco Use   Smoking status: Former    Current packs/day: 0.00    Types: Cigarettes    Quit date: 12/27/2012    Years since quitting: 11.2   Smokeless tobacco: Never  Vaping Use   Vaping status: Never Used  Substance and Sexual Activity   Alcohol use: No   Drug use: No   Sexual activity: Yes    Birth control/protection: Post-menopausal  Other Topics Concern   Not on file  Social History Narrative   Ms. 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   Social Drivers of Corporate Investment Banker Strain: Not on file  Food Insecurity: Not on file  Transportation Needs: Not on file  Physical Activity: Not on file  Stress: Not on file  Social Connections: Not on file  Intimate Partner Violence: Not on file    Lab Results  Component Value Date   HGBA1C 5.8 (A) 04/11/2024   HGBA1C 6.6 (A) 11/22/2023   HGBA1C 6.0 08/24/2023   Lab Results  Component Value Date   CHOL 133 03/22/2023   Lab Results  Component Value Date   HDL 45 03/22/2023   Lab Results  Component Value Date   LDLCALC 70 03/22/2023   Lab Results  Component Value Date   TRIG 93 03/22/2023   Lab Results  Component Value Date   CHOLHDL 3.0 03/22/2023   Lab Results  Component Value Date   CREATININE 0.72 02/10/2024   No results found for: GFR Lab Results  Component Value Date   MICROALBUR 80 09/23/2016      Component Value Date/Time   NA 138 02/10/2024 0808   NA 138 08/24/2023 1033   K 3.3 (L) 02/10/2024 0808   CL 100 02/10/2024 0808   CO2 25 02/10/2024 0808   GLUCOSE 143 (H)  02/10/2024 0808   BUN 12 02/10/2024 0808   BUN 9 08/24/2023 1033   CREATININE 0.72 02/10/2024 0808   CALCIUM  9.3 02/10/2024 0808   PROT 7.8 08/24/2023 1033   ALBUMIN 4.8 08/24/2023 1033   AST 25 08/24/2023 1033   ALT 20 08/24/2023 1033   ALKPHOS 109 08/24/2023 1033   BILITOT 0.6 08/24/2023 1033   GFRNONAA >60 06/18/2023 1037   GFRNONAA >89 04/27/2016 1123   GFRAA 108 12/19/2019 0946   GFRAA >89 04/27/2016 1123      Latest Ref Rng & Units 02/10/2024    8:08 AM 08/24/2023   10:33 AM 06/18/2023   10:37 AM  BMP  Glucose 65 - 99 mg/dL 856  95  893   BUN 7 - 25 mg/dL 12  9  7    Creatinine 0.50 - 1.05 mg/dL 9.27  9.20  9.33   BUN/Creat Ratio 6 - 22 (calc) SEE NOTE:  11    Sodium 135 - 146 mmol/L 138  138  136   Potassium 3.5 - 5.3 mmol/L 3.3  4.2  3.2   Chloride 98 - 110 mmol/L 100  100  100   CO2 20 - 32 mmol/L 25  21  24    Calcium  8.6 - 10.4 mg/dL 9.3  89.7  9.7  Component Value Date/Time   WBC 8.7 08/24/2023 1029   WBC 11.4 (H) 06/18/2023 1037   RBC 4.60 08/24/2023 1029   RBC 4.61 06/18/2023 1037   HGB 13.4 08/24/2023 1029   HCT 40.5 08/24/2023 1029   PLT 320 08/24/2023 1029   MCV 88 08/24/2023 1029   MCH 29.1 08/24/2023 1029   MCH 28.6 06/18/2023 1037   MCHC 33.1 08/24/2023 1029   MCHC 34.1 06/18/2023 1037   RDW 14.0 08/24/2023 1029   LYMPHSABS 3.1 08/24/2023 1029   MONOABS 0.6 06/18/2023 1037   EOSABS 0.5 (H) 08/24/2023 1029   BASOSABS 0.0 08/24/2023 1029     Parts of this note may have been dictated using voice recognition software. There may be variances in spelling and vocabulary which are unintentional. Not all errors are proofread. Please notify the dino if any discrepancies are noted or if the meaning of any statement is not clear.

## 2024-04-12 LAB — MICROALBUMIN / CREATININE URINE RATIO
Creatinine, Urine: 120 mg/dL (ref 20–275)
Microalb Creat Ratio: 13 mg/g{creat} (ref <30)
Microalb, Ur: 1.5 mg/dL

## 2024-04-20 ENCOUNTER — Other Ambulatory Visit: Payer: Self-pay

## 2024-04-20 ENCOUNTER — Other Ambulatory Visit

## 2024-04-20 ENCOUNTER — Other Ambulatory Visit: Payer: Self-pay | Admitting: "Endocrinology

## 2024-04-20 DIAGNOSIS — D3501 Benign neoplasm of right adrenal gland: Secondary | ICD-10-CM

## 2024-04-20 DIAGNOSIS — E7800 Pure hypercholesterolemia, unspecified: Secondary | ICD-10-CM

## 2024-04-21 ENCOUNTER — Other Ambulatory Visit: Payer: Self-pay | Admitting: Pediatrics

## 2024-04-25 ENCOUNTER — Ambulatory Visit: Admitting: "Endocrinology

## 2024-04-30 LAB — DEXAMETHASONE, BLOOD: Dexamethasone, Serum: 336 ng/dL

## 2024-04-30 LAB — CORTISOL: Cortisol, Plasma: 6.8 ug/dL

## 2024-05-03 ENCOUNTER — Ambulatory Visit (INDEPENDENT_AMBULATORY_CARE_PROVIDER_SITE_OTHER): Admitting: "Endocrinology

## 2024-05-03 ENCOUNTER — Encounter: Payer: Self-pay | Admitting: "Endocrinology

## 2024-05-03 VITALS — BP 140/84 | HR 88 | Ht 67.0 in | Wt 218.0 lb

## 2024-05-03 DIAGNOSIS — E1140 Type 2 diabetes mellitus with diabetic neuropathy, unspecified: Secondary | ICD-10-CM | POA: Diagnosis not present

## 2024-05-03 DIAGNOSIS — D3501 Benign neoplasm of right adrenal gland: Secondary | ICD-10-CM | POA: Diagnosis not present

## 2024-05-03 DIAGNOSIS — E7800 Pure hypercholesterolemia, unspecified: Secondary | ICD-10-CM

## 2024-05-03 DIAGNOSIS — Z7984 Long term (current) use of oral hypoglycemic drugs: Secondary | ICD-10-CM

## 2024-05-03 MED ORDER — DEXAMETHASONE 4 MG PO TABS: 4.0000 mg | ORAL_TABLET | Freq: Two times a day (BID) | ORAL | 0 refills | Status: AC

## 2024-05-03 NOTE — Progress Notes (Signed)
 Outpatient Endocrinology Note [name], MD  05/03/2024   Tanya Owens 07-11-1960 [id number]  Referring Provider: Lorrane Pac, MD Primary Care Provider: Lorrane Pac, MD Reason for consultation: Subjective   Assessment & Plan  Diagnoses and all orders for this visit:  Adenoma of right adrenal gland -     dexamethasone  (DECADRON ) 4 MG tablet; Take 1 tablet (4 mg total) by mouth 2 (two) times daily with a meal. -     Cortisol -     Dexamethasone , blood  Type 2 diabetes mellitus with diabetic neuropathy, without long-term current use of insulin (HCC)  Long term (current) use of oral hypoglycemic drugs  Pure hypercholesterolemia   No concerning features for Cushing's such as dorsocervical/supraclavicular fat pad/purple striae/cushingoid features or facies 08/13/2023 CT ABDOMEN WITHOUT CONTRAST: Adrenals/Urinary Tract: The right adrenal gland demonstrates no significant change compared with prior examination again noted is the presence of a well-defined, low-attenuation right adrenal gland mass that measures 3 x 2.8 cm with a mean attenuation value of 10 Hounsfield units. Similar to prior examination may represent adenoma. Stable since prior examination. The second more superior nodule in the right adrenal gland measures 2.2 x 1.7 cm also unchanged since prior examination. No other masses 02/10/24: baseline 8 AM adrenal labs: WNL except undetectable ACTH ; 1 mg ame back abnormal with cortisol at 6.8 (adequately raised dexamethasone ) Ordered 8 mg dexamethasone  suppression test   Component     Latest Ref Rng 02/10/2024  Metanephrine, Pl     <=57 pg/mL <25   Normetanephrine, Pl     <=148 pg/mL 60   Total Metanephrines-Plasma     <=205 pg/mL 60   ALDOSTERONE      ng/dL 2   Renin Activity     0.25 - 5.82 ng/mL/h 0.25   ALDO / PRA Ratio     0.9 - 28.9 Ratio 8.0   DHEA-SO4     9 - 118 mcg/dL 78   R793 ACTH      6 - 50 pg/mL <5 (L)   Cortisol, Plasma     mcg/dL  85.9     Diabetes Type II complicated by neuropathy, No results found for: GFR Hba1c goal less than 7, current Hba1c is  Lab Results  Component Value Date   HGBA1C 5.8 (A) 04/11/2024   Will recommend the following: Metformin  1000 mg bid  No known contraindications/side effects to any of above medications  -Last LD and Tg are as follows: Lab Results  Component Value Date   LDLCALC 70 03/22/2023    Lab Results  Component Value Date   TRIG 93 03/22/2023   -On atorvastatin  80 mg QD -Follow low fat diet and exercise   -Blood pressure goal <140/90 - Microalbumin/creatinine goal is < 30 -Last MA/Cr is as follows: Lab Results  Component Value Date   MICROALBUR 1.5 04/11/2024   -on ACE/ARB lisinopril  40 mg every day  -diet changes including salt restriction -limit eating outside -counseled BP targets per standards of diabetes care -uncontrolled blood pressure can lead to retinopathy, nephropathy and cardiovascular and atherosclerotic heart disease  Reviewed and counseled on: -A1C target -Blood sugar targets -Complications of uncontrolled diabetes  -Checking blood sugar before meals and bedtime and bring log next visit -All medications with mechanism of action and side effects -Hypoglycemia management: rule of 15's, Glucagon  Emergency Kit and medical alert ID -low-carb low-fat plate-method diet -At least 20 minutes of physical activity per day -Annual dilated retinal eye exam and foot exam -compliance  and follow up needs -follow up as scheduled or earlier if problem gets worse  Call if blood sugar is less than 70 or consistently above 250    Take a 15 gm snack of carbohydrate at bedtime before you go to sleep if your blood sugar is less than 100.    If you are going to fast after midnight for a test or procedure, ask your physician for instructions on how to reduce/decrease your insulin dose.    Call if blood sugar is less than 70 or consistently above  250  -Treating a low sugar by rule of 15  (15 gms of sugar every 15 min until sugar is more than 70) If you feel your sugar is low, test your sugar to be sure If your sugar is low (less than 70), then take 15 grams of a fast acting Carbohydrate (3-4 glucose tablets or glucose gel or 4 ounces of juice or regular soda) Recheck your sugar 15 min after treating low to make sure it is more than 70 If sugar is still less than 70, treat again with 15 grams of carbohydrate          Don't drive the hour of hypoglycemia  If unconscious/unable to eat or drink by mouth, use glucagon  injection or nasal spray baqsimi  and call 911. Can repeat again in 15 min if still unconscious.  Return in about 22 days (around 05/25/2024) for visit and 8 am labs before next visit.   I have reviewed current medications, nurse's notes, allergies, vital signs, past medical and surgical history, family medical history, and social history for this encounter. Counseled patient on symptoms, examination findings, lab findings, imaging results, treatment decisions and monitoring and prognosis. The patient understood the recommendations and agrees with the treatment plan. All questions regarding treatment plan were fully answered.  [name], MD  05/03/2024   History of Present Illness Tanya Owens is a 63 y.o. year old female who presents for follow up of Type II diabetes mellitus and Right adrenal adenoma.  Patient has no active complaints today.   08/13/2023 CT ABDOMEN WITHOUT CONTRAST  Adrenals/Urinary Tract: The right adrenal gland demonstrates no significant change compared with prior examination again noted is the presence of a well-defined, low-attenuation right adrenal gland mass that measures 3 x 2.8 cm with a mean attenuation value of 10 Hounsfield units. Similar to prior examination may represent adenoma. Stable since prior examination. The second more superior nodule in the right adrenal gland measures 2.2 x 1.7  cm also unchanged since prior examination. No other masses  Tanya Owens was first diagnosed in 2015.   Diabetes education -  Home diabetes regimen: Metformin  1000 mg bid  COMPLICATIONS -  MI/Stroke -  retinopathy +  neuropathy -  nephropathy  BLOOD SUGAR DATA 106-11 per recall Did not bring meter Checks 3 times/day  Physical Exam  BP (!) 140/84   Pulse 88   Ht 5' 7 (1.702 m)   Wt 218 lb (98.9 kg)   SpO2 99%   BMI 34.14 kg/m    Constitutional: well developed, well nourished No concerning features for Cushing's such as dorsocervical/supraclavicular fat pad/purple striae/cushingoid features or facies Head: normocephalic, atraumatic Eyes: sclera anicteric, no redness Neck: supple Lungs: normal respiratory effort Neurology: alert and oriented Skin: dry, no appreciable rashes Musculoskeletal: no appreciable defects Psychiatric: normal mood and affect Diabetic Foot Exam - Simple   No data filed      Current Medications Patient's Medications  New Prescriptions   DEXAMETHASONE  (DECADRON ) 4 MG TABLET    Take 1 tablet (4 mg total) by mouth 2 (two) times daily with a meal.  Previous Medications   AMLODIPINE  (NORVASC ) 10 MG TABLET    Take 1 tablet (10 mg total) by mouth daily.   AMOXICILLIN -CLAVULANATE (AUGMENTIN ) 875-125 MG TABLET    Take 1 tablet by mouth 2 (two) times daily.   ATORVASTATIN  (LIPITOR) 80 MG TABLET    Take 1 tablet (80 mg total) by mouth daily.   CLOBETASOL  CREAM (TEMOVATE ) 0.05 %    Apply 1 Application topically as needed.   ENBREL SURECLICK 50 MG/ML INJECTION    Inject into the skin.   ETANERCEPT 50 MG/ML SOCT    Inject into the skin.   FOLIC ACID  (FOLVITE ) 1 MG TABLET    Take 1 mg by mouth daily.   GABAPENTIN  (NEURONTIN ) 100 MG CAPSULE    TAKE 1 CAPSULE(100 MG) BY MOUTH THREE TIMES DAILY   HYDROCHLOROTHIAZIDE  (HYDRODIURIL ) 25 MG TABLET    TAKE 1 TABLET(25 MG) BY MOUTH DAILY   KETOCONAZOLE  (NIZORAL ) 2 % CREAM    APPLY TOPICALLY TO THE AFFECTED AREA  TWICE DAILY   LISINOPRIL  (ZESTRIL ) 40 MG TABLET    Take 1 tablet (40 mg total) by mouth daily.   LUBIPROSTONE  (AMITIZA ) 24 MCG CAPSULE    TAKE 1 CAPSULE(24 MCG) BY MOUTH TWICE DAILY WITH A MEAL   METFORMIN  (GLUCOPHAGE ) 1000 MG TABLET    TAKE 1 TABLET(1000 MG) BY MOUTH TWICE DAILY WITH A MEAL   METHOTREXATE  (RHEUMATREX) 2.5 MG TABLET    TAKE 6 TABLETS(15 MG) BY MOUTH 1 TIME A WEEK   OMEPRAZOLE  (PRILOSEC) 40 MG CAPSULE    Take 1 capsule (40 mg total) by mouth daily before breakfast.   POLYETHYLENE GLYCOL POWDER (GLYCOLAX /MIRALAX ) 17 GM/SCOOP POWDER    Take 17 g by mouth 2 (two) times daily as needed.   PYLERA  140-125-125 MG CAPS    Take 3 capsules by mouth 4 (four) times daily - after meals and at bedtime.   SENNA (SENOKOT) 8.6 MG TABS TABLET    Take 2 tablets (17.2 mg total) by mouth daily.  Modified Medications   No medications on file  Discontinued Medications   No medications on file    Allergies Allergies  Allergen Reactions   Tylenol  [Acetaminophen ] Other (See Comments)    Stomach cramps     Past Medical History Past Medical History:  Diagnosis Date   Arthritis 2000   Diabetes mellitus without complication (HCC) 2005   Hemorrhoids    Hypercholesteremia 2005   Hypertension 2005   Sleep apnea 2010   Sleep disorder     Past Surgical History Past Surgical History:  Procedure Laterality Date   CARDIAC CATHETERIZATION  10/18/2007   Normal coronaries, systemic hypertension    Family History family history includes Alzheimer's disease in her maternal grandmother; Diabetes in her mother and sister; Heart attack in her mother; Hypercholesterolemia in her mother; Hypertension in her brother, brother, mother, and sister.  Social History Social History   Socioeconomic History   Marital status: Single    Spouse name: Not on file   Number of children: 5   Years of education: Not on file   Highest education level: Not on file  Occupational History   Not on file  Tobacco Use    Smoking status: Former    Current packs/day: 0.00    Types: Cigarettes    Quit date: 12/27/2012    Years  since quitting: 11.3   Smokeless tobacco: Never  Vaping Use   Vaping status: Never Used  Substance and Sexual Activity   Alcohol use: No   Drug use: No   Sexual activity: Yes    Birth control/protection: Post-menopausal  Other Topics Concern   Not on file  Social History Narrative   Ms. 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   Social Drivers of Health   Tobacco Use: Medium Risk (05/03/2024)   Patient History    Smoking Tobacco Use: Former    Smokeless Tobacco Use: Never    Passive Exposure: Not on Actuary Strain: Not on file  Food Insecurity: Not on file  Transportation Needs: Not on file  Physical Activity: Not on file  Stress: Not on file  Social Connections: Not on file  Intimate Partner Violence: Not on file  Depression (PHQ2-9): Low Risk (11/22/2023)   Depression (PHQ2-9)    PHQ-2 Score: 4  Alcohol Screen: Not on file  Housing: Not on file  Utilities: Not on file  Health Literacy: Not on file    Lab Results  Component Value Date   HGBA1C 5.8 (A) 04/11/2024   HGBA1C 6.6 (A) 11/22/2023   HGBA1C 6.0 08/24/2023   Lab Results  Component Value Date   CHOL 133 03/22/2023   Lab Results  Component Value Date   HDL 45 03/22/2023   Lab Results  Component Value Date   LDLCALC 70 03/22/2023   Lab Results  Component Value Date   TRIG 93 03/22/2023   Lab Results  Component Value Date   CHOLHDL 3.0 03/22/2023   Lab Results  Component Value Date   CREATININE 0.72 02/10/2024   No results found for: GFR Lab Results  Component Value Date   MICROALBUR 1.5 04/11/2024      Component Value Date/Time   NA 138 02/10/2024 0808   NA 138 08/24/2023 1033   K 3.3 (L) 02/10/2024 0808   CL  100 02/10/2024 0808   CO2 25 02/10/2024 0808   GLUCOSE 143 (H) 02/10/2024 0808   BUN 12 02/10/2024 0808   BUN 9 08/24/2023 1033   CREATININE 0.72 02/10/2024 0808   CALCIUM  9.3 02/10/2024 0808   PROT 7.8 08/24/2023 1033   ALBUMIN 4.8 08/24/2023 1033   AST 25 08/24/2023 1033   ALT 20 08/24/2023 1033   ALKPHOS 109 08/24/2023 1033   BILITOT 0.6 08/24/2023 1033   GFRNONAA >60 06/18/2023 1037   GFRNONAA >89 04/27/2016 1123   GFRAA 108 12/19/2019 0946   GFRAA >89 04/27/2016 1123      Latest Ref Rng & Units 02/10/2024    8:08 AM 08/24/2023   10:33 AM 06/18/2023   10:37 AM  BMP  Glucose 65 - 99 mg/dL 856  95  893   BUN 7 - 25 mg/dL 12  9  7    Creatinine 0.50 - 1.05 mg/dL 9.27  9.20  9.33   BUN/Creat Ratio 6 - 22 (calc) SEE NOTE:  11    Sodium 135 - 146 mmol/L 138  138  136   Potassium 3.5 - 5.3 mmol/L 3.3  4.2  3.2   Chloride 98 - 110 mmol/L 100  100  100   CO2 20 - 32 mmol/L 25  21  24    Calcium  8.6 -  10.4 mg/dL 9.3  89.7  9.7        Component Value Date/Time   WBC 8.7 08/24/2023 1029   WBC 11.4 (H) 06/18/2023 1037   RBC 4.60 08/24/2023 1029   RBC 4.61 06/18/2023 1037   HGB 13.4 08/24/2023 1029   HCT 40.5 08/24/2023 1029   PLT 320 08/24/2023 1029   MCV 88 08/24/2023 1029   MCH 29.1 08/24/2023 1029   MCH 28.6 06/18/2023 1037   MCHC 33.1 08/24/2023 1029   MCHC 34.1 06/18/2023 1037   RDW 14.0 08/24/2023 1029   LYMPHSABS 3.1 08/24/2023 1029   MONOABS 0.6 06/18/2023 1037   EOSABS 0.5 (H) 08/24/2023 1029   BASOSABS 0.0 08/24/2023 1029     Parts of this note may have been dictated using voice recognition software. There may be variances in spelling and vocabulary which are unintentional. Not all errors are proofread. Please notify the dino if any discrepancies are noted or if the meaning of any statement is not clear.

## 2024-05-20 ENCOUNTER — Ambulatory Visit: Admission: EM | Admit: 2024-05-20 | Discharge: 2024-05-20 | Disposition: A | Discharge status: Home / Self Care

## 2024-05-20 DIAGNOSIS — J0100 Acute maxillary sinusitis, unspecified: Secondary | ICD-10-CM | POA: Diagnosis not present

## 2024-05-20 MED ORDER — AMOXICILLIN-POT CLAVULANATE 875-125 MG PO TABS: 1.0000 | ORAL_TABLET | Freq: Two times a day (BID) | ORAL | 0 refills | Status: AC

## 2024-05-20 MED ORDER — GUAIFENESIN ER 1200 MG PO TB12: 1.0000 | ORAL_TABLET | Freq: Two times a day (BID) | ORAL | 0 refills | Status: AC | PRN

## 2024-05-20 MED ORDER — FLUTICASONE PROPIONATE 50 MCG/ACT NA SUSP: 1.0000 | Freq: Every day | NASAL | 0 refills | Status: AC

## 2024-05-20 NOTE — ED Triage Notes (Signed)
 Tanya Owens presents with a headaches x 2 week. She states I have a sinus infection, pain in face. I went through this before, my blood pressure was high and once I get antibiotics,it goes away. Treated with Ibuprofen  with very little relief.

## 2024-05-20 NOTE — ED Provider Notes (Signed)
 " [facility]    CSN: [id number] Arrival date & time: 05/20/24  0807      History   Chief Complaint No chief complaint on file.   HPI Tanya Owens is a 64 y.o. female.   Tanya Owens presents today with complaint of illness that began 2 weeks ago and has been fluctuating in severity since onset.  She reports that she has had maxillary headaches and facial pain for 2 weeks.  She reports that they are alleviated with ibuprofen , but always return once ibuprofen  wears off.  This has been associated with nasal congestion, intermittent earache, postnasal drip, and sore throat.  She reports that she developed a dry cough last night.  No fevers, chills, body aches, shortness of breath, wheezing, nausea, vomiting, or diarrhea.  Reports compliance with blood pressure medications.  She has been taking ibuprofen  for symptoms but has not attempted any other at home measures  The history is provided by the patient.    Past Medical History:  Diagnosis Date   Arthritis 2000   Diabetes mellitus without complication (HCC) 2005   Hemorrhoids    Hypercholesteremia 2005   Hypertension 2005   Sleep apnea 2010   Sleep disorder     Patient Active Problem List   Diagnosis Date Noted   Severe hypertension 06/01/2023   Abdominal pain 02/05/2023   Rheumatoid arthritis (HCC) 11/23/2022   Costochondritis 02/09/2022   DOE (dyspnea on exertion) 02/13/2021   Plantar fasciitis, bilateral 01/13/2021   Pes planus 01/06/2021   Bilateral chronic knee pain 09/28/2019   Other chronic pain 08/14/2019   Seronegative arthropathy of multiple sites (HCC) 07/30/2017   Acromioclavicular joint arthritis 03/26/2017   Frequent No-show for appointment 12/15/2016   Cyclic citrullinated peptide (CCP) antibody positive 10/28/2016   Obesity (BMI 30-39.9) 11/28/2014   Type 2 diabetes mellitus with diabetic neuropathy, unspecified (HCC) 11/28/2014   HTN (hypertension) 06/21/2013   Mixed hyperlipidemia  06/21/2013    Past Surgical History:  Procedure Laterality Date   CARDIAC CATHETERIZATION  10/18/2007   Normal coronaries, systemic hypertension    OB History   No obstetric history on file.      Home Medications    Prior to Admission medications  Medication Sig Start Date End Date Taking? Authorizing Provider  amoxicillin -clavulanate (AUGMENTIN ) 875-125 MG tablet Take 1 tablet by mouth every 12 (twelve) hours. 05/20/24  Yes Leatrice Vernell HERO, NP  fluticasone  (FLONASE ) 50 MCG/ACT nasal spray Place 1 spray into both nostrils daily. 05/20/24  Yes Leatrice Vernell HERO, NP  Guaifenesin  1200 MG TB12 Take 1 tablet (1,200 mg total) by mouth 2 (two) times daily as needed (Congestion). 05/20/24  Yes Leatrice Vernell HERO, NP  amLODipine  (NORVASC ) 10 MG tablet Take 1 tablet (10 mg total) by mouth daily. 02/08/24 05/08/24  Lorrane Pac, MD  atorvastatin  (LIPITOR) 80 MG tablet Take 1 tablet (80 mg total) by mouth daily. 11/22/23 05/03/24  Lorrane Pac, MD  clobetasol  cream (TEMOVATE ) 0.05 % Apply 1 Application topically as needed. 06/08/23   Rolinda Rogue, MD  dexamethasone  (DECADRON ) 4 MG tablet Take 1 tablet (4 mg total) by mouth 2 (two) times daily with a meal. 05/03/24   Motwani, Obadiah, MD  ENBREL SURECLICK 50 MG/ML injection Inject into the skin.    [provider]  Etanercept 50 MG/ML SOCT Inject into the skin. 09/20/17   [provider]  folic acid  (FOLVITE ) 1 MG tablet Take 1 mg by mouth daily. 06/04/22   [provider]  gabapentin  (NEURONTIN ) 100 MG capsule TAKE 1 CAPSULE(100 MG) BY MOUTH THREE TIMES DAILY 11/22/23   Lorrane Pac, MD  hydrochlorothiazide  (HYDRODIURIL ) 25 MG tablet TAKE 1 TABLET(25 MG) BY MOUTH DAILY 02/12/24   Lorrane Pac, MD  ketoconazole  (NIZORAL ) 2 % cream APPLY TOPICALLY TO THE AFFECTED AREA TWICE DAILY 11/22/23   Lorrane Pac, MD  lisinopril  (ZESTRIL ) 40 MG tablet Take 1 tablet (40 mg total) by mouth daily. 11/22/23   Lorrane Pac, MD   lubiprostone  (AMITIZA ) 24 MCG capsule TAKE 1 CAPSULE(24 MCG) BY MOUTH TWICE DAILY WITH A MEAL 04/21/24   McGreal, Inocente HERO, MD  metFORMIN  (GLUCOPHAGE ) 1000 MG tablet TAKE 1 TABLET(1000 MG) BY MOUTH TWICE DAILY WITH A MEAL 03/25/24   Lorrane Pac, MD  methotrexate  (RHEUMATREX) 2.5 MG tablet TAKE 6 TABLETS(15 MG) BY MOUTH 1 TIME A WEEK 04/13/23   [provider]  omeprazole  (PRILOSEC) 40 MG capsule Take 1 capsule (40 mg total) by mouth daily before breakfast. 09/21/23   McGreal, Inocente HERO, MD  polyethylene glycol powder (GLYCOLAX /MIRALAX ) 17 GM/SCOOP powder Take 17 g by mouth 2 (two) times daily as needed. 07/21/21   Dayna Motto, DO  PYLERA  [id number] MG CAPS Take 3 capsules by mouth 4 (four) times daily - after meals and at bedtime. 07/19/23   Suzann Inocente HERO, MD  senna (SENOKOT) 8.6 MG TABS tablet Take 2 tablets (17.2 mg total) by mouth daily. 08/24/23   Bryan Bianchi, MD    Family History Family History  Problem Relation Age of Onset   Heart attack Mother    Diabetes Mother    Hypercholesterolemia Mother    Hypertension Mother    Diabetes Sister    Hypertension Sister    Hypertension Brother    Hypertension Brother    Alzheimer's disease Maternal Grandmother    Colon cancer Neg Hx    Crohn's disease Neg Hx    Esophageal cancer Neg Hx    Inflammatory bowel disease Neg Hx    Rectal cancer Neg Hx     Social History Social History[1]   Allergies   Tylenol  [acetaminophen ]   Review of Systems Review of Systems   Physical Exam Triage Vital Signs ED Triage Vitals  Encounter Vitals Group     BP 05/20/24 0842 (!) 167/96     Girls Systolic BP Percentile --      Girls Diastolic BP Percentile --      Boys Systolic BP Percentile --      Boys Diastolic BP Percentile --      Pulse Rate 05/20/24 0842 74     Resp 05/20/24 0842 18     Temp 05/20/24 0842 97.9 F (36.6 C)     Temp Source 05/20/24 0842 Oral     SpO2 05/20/24 0842 98 %     Weight 05/20/24 0841 220 lb (99.8 kg)      Height 05/20/24 0841 5' 7 (1.702 m)     Head Circumference --      Peak Flow --      Pain Score 05/20/24 0841 10     Pain Loc --      Pain Education --      Exclude from Growth Chart --    No data found.  Updated Vital Signs BP (!) 167/96 (BP Location: Left Arm)   Pulse 74   Temp 97.9 F (36.6 C) (Oral)   Resp 18   Ht 5' 7 (1.702 m)   Wt 220 lb (99.8 kg)   SpO2 98%  BMI 34.46 kg/m   Visual Acuity Right Eye Distance:   Left Eye Distance:   Bilateral Distance:    Right Eye Near:   Left Eye Near:    Bilateral Near:     Physical Exam Vitals and nursing note reviewed.  Constitutional:      General: She is not in acute distress.    Appearance: Normal appearance. She is normal weight. She is not toxic-appearing.  HENT:     Head: Normocephalic.     Right Ear: Ear canal and external ear normal. A middle ear effusion (Serous) is present. There is no impacted cerumen. No mastoid tenderness. No hemotympanum. Tympanic membrane is injected and bulging (Moderate). Tympanic membrane is not erythematous.     Left Ear: Ear canal and external ear normal. A middle ear effusion (Serous) is present. There is no impacted cerumen. No mastoid tenderness. No hemotympanum. Tympanic membrane is not injected, erythematous or bulging.     Nose: Congestion present. No rhinorrhea.     Right Sinus: Maxillary sinus tenderness present.     Left Sinus: Maxillary sinus tenderness present.     Mouth/Throat:     Mouth: Mucous membranes are moist.     Pharynx: Oropharynx is clear. Posterior oropharyngeal erythema and postnasal drip present. No oropharyngeal exudate.     Tonsils: No tonsillar exudate or tonsillar abscesses. 1+ on the right. 1+ on the left.     Comments: Mild oropharynx erythema with posterior injection and post nasal drip  Eyes:     Conjunctiva/sclera: Conjunctivae normal.  Cardiovascular:     Rate and Rhythm: Normal rate and regular rhythm.     Heart sounds: Normal heart sounds.   Pulmonary:     Effort: Pulmonary effort is normal.     Breath sounds: Normal breath sounds.  Musculoskeletal:     Cervical back: Neck supple.  Lymphadenopathy:     Cervical: No cervical adenopathy.  Skin:    General: Skin is warm and dry.  Neurological:     Mental Status: She is alert and oriented to person, place, and time.  Psychiatric:        Mood and Affect: Mood normal.        Behavior: Behavior normal.      UC Treatments / Results  Labs (all labs ordered are listed, but only abnormal results are displayed) Labs Reviewed - No data to display  EKG   Radiology No results found.  Procedures Procedures (including critical care time)  Medications Ordered in UC Medications - No data to display  Initial Impression / Assessment and Plan / UC Course  I have reviewed the triage vital signs and the nursing notes.  Pertinent labs & imaging results that were available during my care of the patient were reviewed by me and considered in my medical decision making (see chart for details).     Maxillary sinusitis Bilateral maxillary sinus tenderness to palpation.  Serous otitis media to right ear.  Due to the duration of symptoms, will treat with antibiotic today.  Augmentin  twice daily for 7 days.  Also advised Mucinex  1200 mg twice daily and Flonase  nasal spray every morning as needed for sinus congestion.  Advised patient that as sinus infection resolves, she will notice an increase in postnasal drip, productive cough, and rhinorrhea.  Advised that she return for any fevers, shortness of breath, wheezing, nausea, or vomiting. Final Clinical Impressions(s) / UC Diagnoses   Final diagnoses:  Acute non-recurrent maxillary sinusitis     Discharge  Instructions       Sinusitis is an inflammation and infection of the sinus cavities that can cause facial pressure, congestion, and thick nasal drainage. Treatment helps reduce infection, open the sinuses, and relieve  symptoms.  Common symptoms  - Nasal congestion or thick nasal discharge - Facial pressure or pain (forehead, cheeks, behind eyes) - Post-nasal drip - Headache - Decreased sense of smell - Cough, often worse at night  Expected recovery  - Symptoms usually begin to improve within 2-3 days of starting antibiotics - Congestion and pressure may take 1-2 weeks to fully resolve - Complete the full course of antibiotics even if you feel better  Treatment Plan  Amoxicillin  / Clavulanate (Augmentin )  - Why it works: Treats bacteria commonly responsible for sinus infections - How to take: Take exactly as prescribed, with food to reduce stomach upset - Common side effects: Diarrhea, nausea, mild stomach upset - Call the office if you develop severe or persistent diarrhea or a rash  Fluticasone  nasal spray (Flonase )  - Why it works: Reduces inflammation inside the nasal passages to improve drainage - How to use: 1-2 sprays in each nostril once daily - Common side effects: Nasal irritation, dryness, mild nosebleeds - Aim the spray slightly outward, away from the nasal septum  Guaifenesin  12-hour  - Why it works: Thins mucus so it can drain more easily from the sinuses - How to take: Take with plenty of fluids - Common side effects: Nausea, upset stomach  Symptom Management at Home  - Steam showers to loosen congestion - Saline nasal spray several times daily to moisturize and flush nasal passages - Adequate hydration to help thin mucus - Rest as needed  When to Contact the Office  - No improvement after 3-5 days of antibiotics - Worsening facial pain, swelling, or headache - Fever that persists or returns - Medication side effects that do not improve  When to Go to the Emergency Department  - Swelling around the eyes or vision changes - Severe headache with neck stiffness - High fever with worsening symptoms - Confusion or difficulty breathing       ED  Prescriptions     Medication Sig Dispense Auth. Provider   amoxicillin -clavulanate (AUGMENTIN ) 875-125 MG tablet Take 1 tablet by mouth every 12 (twelve) hours. 14 tablet Leatrice Vernell HERO, NP   Guaifenesin  1200 MG TB12 Take 1 tablet (1,200 mg total) by mouth 2 (two) times daily as needed (Congestion). 42 each Leatrice Vernell HERO, NP   fluticasone  (FLONASE ) 50 MCG/ACT nasal spray Place 1 spray into both nostrils daily. 11.1 g Leatrice Vernell HERO, NP      PDMP not reviewed this encounter.    [1]  Social History Tobacco Use   Smoking status: Former    Current packs/day: 0.00    Types: Cigarettes    Quit date: 12/27/2012    Years since quitting: 11.4   Smokeless tobacco: Never  Vaping Use   Vaping status: Never Used  Substance Use Topics   Alcohol use: No   Drug use: No     Leatrice Vernell HERO, NP 05/20/24 [phone]  "

## 2024-05-20 NOTE — Discharge Instructions (Signed)
" °  Sinusitis is an inflammation and infection of the sinus cavities that can cause facial pressure, congestion, and thick nasal drainage. Treatment helps reduce infection, open the sinuses, and relieve symptoms.  Common symptoms  - Nasal congestion or thick nasal discharge - Facial pressure or pain (forehead, cheeks, behind eyes) - Post-nasal drip - Headache - Decreased sense of smell - Cough, often worse at night  Expected recovery  - Symptoms usually begin to improve within 2-3 days of starting antibiotics - Congestion and pressure may take 1-2 weeks to fully resolve - Complete the full course of antibiotics even if you feel better  Treatment Plan  Amoxicillin  / Clavulanate (Augmentin )  - Why it works: Treats bacteria commonly responsible for sinus infections - How to take: Take exactly as prescribed, with food to reduce stomach upset - Common side effects: Diarrhea, nausea, mild stomach upset - Call the office if you develop severe or persistent diarrhea or a rash  Fluticasone  nasal spray (Flonase )  - Why it works: Reduces inflammation inside the nasal passages to improve drainage - How to use: 1-2 sprays in each nostril once daily - Common side effects: Nasal irritation, dryness, mild nosebleeds - Aim the spray slightly outward, away from the nasal septum  Guaifenesin  12-hour  - Why it works: Thins mucus so it can drain more easily from the sinuses - How to take: Take with plenty of fluids - Common side effects: Nausea, upset stomach  Symptom Management at Home  - Steam showers to loosen congestion - Saline nasal spray several times daily to moisturize and flush nasal passages - Adequate hydration to help thin mucus - Rest as needed  When to Contact the Office  - No improvement after 3-5 days of antibiotics - Worsening facial pain, swelling, or headache - Fever that persists or returns - Medication side effects that do not improve  When to Go to the Emergency  Department  - Swelling around the eyes or vision changes - Severe headache with neck stiffness - High fever with worsening symptoms - Confusion or difficulty breathing   "

## 2024-05-21 ENCOUNTER — Other Ambulatory Visit: Payer: Self-pay

## 2024-05-21 DIAGNOSIS — I10 Essential (primary) hypertension: Secondary | ICD-10-CM

## 2024-05-22 ENCOUNTER — Other Ambulatory Visit

## 2024-05-22 NOTE — Telephone Encounter (Signed)
 Chart reviewed  -Fairy Amy, MD

## 2024-05-23 LAB — LIPID PANEL
Cholesterol: 103 mg/dL
HDL: 40 mg/dL — ABNORMAL LOW
LDL Cholesterol (Calc): 51 mg/dL
Non-HDL Cholesterol (Calc): 63 mg/dL
Total CHOL/HDL Ratio: 2.6 (calc)
Triglycerides: 50 mg/dL

## 2024-05-25 ENCOUNTER — Other Ambulatory Visit

## 2024-05-25 ENCOUNTER — Encounter: Payer: Self-pay | Admitting: "Endocrinology

## 2024-05-25 ENCOUNTER — Ambulatory Visit (INDEPENDENT_AMBULATORY_CARE_PROVIDER_SITE_OTHER): Admitting: "Endocrinology

## 2024-05-25 VITALS — BP 122/80 | HR 77 | Ht 67.0 in | Wt 219.0 lb

## 2024-05-25 DIAGNOSIS — E1140 Type 2 diabetes mellitus with diabetic neuropathy, unspecified: Secondary | ICD-10-CM

## 2024-05-25 DIAGNOSIS — E7800 Pure hypercholesterolemia, unspecified: Secondary | ICD-10-CM

## 2024-05-25 DIAGNOSIS — Z7984 Long term (current) use of oral hypoglycemic drugs: Secondary | ICD-10-CM

## 2024-05-25 DIAGNOSIS — D3501 Benign neoplasm of right adrenal gland: Secondary | ICD-10-CM

## 2024-05-25 LAB — POCT GLYCOSYLATED HEMOGLOBIN (HGB A1C): Hemoglobin A1C: 6 % — AB (ref 4.0–5.6)

## 2024-05-25 NOTE — Progress Notes (Signed)
 "   Outpatient Endocrinology Note Tanya Birmingham, MD  05/25/2024   Tanya Owens [birth year] 58 [id number]  Referring Provider: Lorrane Pac, MD Primary Care Provider: Lorrane Pac, MD Reason for consultation: Subjective   Assessment & Plan  Diagnoses and all orders for this visit:  Adenoma of right adrenal gland  Type 2 diabetes mellitus with diabetic neuropathy, without long-term current use of insulin (HCC) -     POCT glycosylated hemoglobin (Hb A1C) -     Cortisol,free,24 hour urine w/creatinine; Future -     Salivary Cortisol X2, Timed -     Cortisol,free,24 hour urine w/creatinine  Long term (current) use of oral hypoglycemic drugs  Pure hypercholesterolemia   No concerning features for Cushing's such as dorsocervical/supraclavicular fat pad/purple striae/cushingoid features or facies 08/13/2023 CT ABDOMEN WITHOUT CONTRAST: Adrenals/Urinary Tract: The right adrenal gland demonstrates no significant change compared with prior examination again noted is the presence of a well-defined, low-attenuation right adrenal gland mass that measures 3 x 2.8 cm with a mean attenuation value of 10 Hounsfield units. Similar to prior examination may represent adenoma. Stable since prior examination. The second more superior nodule in the right adrenal gland measures 2.2 x 1.7 cm also unchanged since prior examination. No other masses 02/10/24: baseline 8 AM adrenal labs: WNL except undetectable ACTH ; 1 mg came back abnormal with cortisol at 6.8 (adequately raised dexamethasone ) 05/22/24: 8 mg dexamethasone  suppression test abnormal with cortisol at 9.2 Ordered salivary cortisol x 2 and 24 hr urine cortisol   Component     Latest Ref Rng 02/10/2024  Metanephrine, Pl     <=57 pg/mL <25   Normetanephrine, Pl     <=148 pg/mL 60   Total Metanephrines-Plasma     <=205 pg/mL 60   ALDOSTERONE      ng/dL 2   Renin Activity     0.25 - 5.82 ng/mL/h 0.25   ALDO / PRA Ratio     0.9 - 28.9  Ratio 8.0   DHEA-SO4     9 - 118 mcg/dL 78   R793 ACTH      6 - 50 pg/mL <5 (L)   Cortisol, Plasma     mcg/dL 85.9     Diabetes Type II complicated by neuropathy, No results found for: GFR Hba1c goal less than 7, current Hba1c is  Lab Results  Component Value Date   HGBA1C 6.0 (A) 05/25/2024   Will recommend the following: Metformin  1000 mg bid  No known contraindications/side effects to any of above medications  -Last LD and Tg are as follows: Lab Results  Component Value Date   LDLCALC 51 05/22/2024    Lab Results  Component Value Date   TRIG 50 05/22/2024   -On atorvastatin  80 mg QD -Follow low fat diet and exercise   -Blood pressure goal <140/90 - Microalbumin/creatinine goal is < 30 -Last MA/Cr is as follows: Lab Results  Component Value Date   MICROALBUR 1.5 04/11/2024   -on ACE/ARB lisinopril  40 mg every day  -diet changes including salt restriction -limit eating outside -counseled BP targets per standards of diabetes care -uncontrolled blood pressure can lead to retinopathy, nephropathy and cardiovascular and atherosclerotic heart disease  Reviewed and counseled on: -A1C target -Blood sugar targets -Complications of uncontrolled diabetes  -Checking blood sugar before meals and bedtime and bring log next visit -All medications with mechanism of action and side effects -Hypoglycemia management: rule of 15's, Glucagon  Emergency Kit and medical alert ID -low-carb low-fat plate-method diet -At least  20 minutes of physical activity per day -Annual dilated retinal eye exam and foot exam -compliance and follow up needs -follow up as scheduled or earlier if problem gets worse  Call if blood sugar is less than 70 or consistently above 250    Take a 15 gm snack of carbohydrate at bedtime before you go to sleep if your blood sugar is less than 100.    If you are going to fast after midnight for a test or procedure, ask your physician for instructions on how  to reduce/decrease your insulin dose.    Call if blood sugar is less than 70 or consistently above 250  -Treating a low sugar by rule of 15  (15 gms of sugar every 15 min until sugar is more than 70) If you feel your sugar is low, test your sugar to be sure If your sugar is low (less than 70), then take 15 grams of a fast acting Carbohydrate (3-4 glucose tablets or glucose gel or 4 ounces of juice or regular soda) Recheck your sugar 15 min after treating low to make sure it is more than 70 If sugar is still less than 70, treat again with 15 grams of carbohydrate          Don't drive the hour of hypoglycemia  If unconscious/unable to eat or drink by mouth, use glucagon  injection or nasal spray baqsimi  and call 911. Can repeat again in 15 min if still unconscious.  Return in about 20 days (around 06/14/2024) for pick up container for 24 hour urine today, labs today.   I have reviewed current medications, nurse's notes, allergies, vital signs, past medical and surgical history, family medical history, and social history for this encounter. Counseled patient on symptoms, examination findings, lab findings, imaging results, treatment decisions and monitoring and prognosis. The patient understood the recommendations and agrees with the treatment plan. All questions regarding treatment plan were fully answered.  Tanya Birmingham, MD  05/25/2024   History of Present Illness Tanya Owens is a 64 y.o. year old female who presents for follow up of Type II diabetes mellitus and Right adrenal adenoma.  Patient has no active complaints today.  Gets abdominal pain at times, like a knot on right side of abdomen No nausea/vomiting Lost about 40 lbs in past 5 mo-unintentional; no fever/night sweats  08/13/2023 CT ABDOMEN WITHOUT CONTRAST  Adrenals/Urinary Tract: The right adrenal gland demonstrates no significant change compared with prior examination again noted is the presence of a well-defined,  low-attenuation right adrenal gland mass that measures 3 x 2.8 cm with a mean attenuation value of 10 Hounsfield units. Similar to prior examination may represent adenoma. Stable since prior examination. The second more superior nodule in the right adrenal gland measures 2.2 x 1.7 cm also unchanged since prior examination. No other masses  Tanya Owens was first diagnosed in 2015.   Diabetes education -  Home diabetes regimen: Metformin  1000 mg bid  COMPLICATIONS -  MI/Stroke -  retinopathy +  neuropathy -  nephropathy  BLOOD SUGAR DATA 86-110 per recall Did not bring meter Checks 4 times/day  Physical Exam  BP 122/80   Pulse 77   Ht 5' 7 (1.702 m)   Wt 219 lb (99.3 kg)   SpO2 96%   BMI 34.30 kg/m    Constitutional: well developed, well nourished No concerning features for Cushing's such as dorsocervical/supraclavicular fat pad/purple striae/cushingoid features or facies Head: normocephalic, atraumatic Eyes: sclera anicteric, no redness Neck:  supple Lungs: normal respiratory effort Neurology: alert and oriented Skin: dry, no appreciable rashes Musculoskeletal: no appreciable defects Psychiatric: normal mood and affect Diabetic Foot Exam - Simple   No data filed      Current Medications Patient's Medications  New Prescriptions   No medications on file  Previous Medications   AMLODIPINE  (NORVASC ) 10 MG TABLET    Take 1 tablet (10 mg total) by mouth daily.   AMOXICILLIN -CLAVULANATE (AUGMENTIN ) 875-125 MG TABLET    Take 1 tablet by mouth every 12 (twelve) hours.   ATORVASTATIN  (LIPITOR) 80 MG TABLET    Take 1 tablet (80 mg total) by mouth daily.   CLOBETASOL  CREAM (TEMOVATE ) 0.05 %    Apply 1 Application topically as needed.   DEXAMETHASONE  (DECADRON ) 4 MG TABLET    Take 1 tablet (4 mg total) by mouth 2 (two) times daily with a meal.   ENBREL SURECLICK 50 MG/ML INJECTION    Inject into the skin.   ETANERCEPT 50 MG/ML SOCT    Inject into the skin.   FLUTICASONE   (FLONASE ) 50 MCG/ACT NASAL SPRAY    Place 1 spray into both nostrils daily.   FOLIC ACID  (FOLVITE ) 1 MG TABLET    Take 1 mg by mouth daily.   GABAPENTIN  (NEURONTIN ) 100 MG CAPSULE    TAKE 1 CAPSULE(100 MG) BY MOUTH THREE TIMES DAILY   GUAIFENESIN  1200 MG TB12    Take 1 tablet (1,200 mg total) by mouth 2 (two) times daily as needed (Congestion).   HYDROCHLOROTHIAZIDE  (HYDRODIURIL ) 25 MG TABLET    TAKE 1 TABLET(25 MG) BY MOUTH DAILY   KETOCONAZOLE  (NIZORAL ) 2 % CREAM    APPLY TOPICALLY TO THE AFFECTED AREA TWICE DAILY   LISINOPRIL  (ZESTRIL ) 40 MG TABLET    Take 1 tablet (40 mg total) by mouth daily.   LUBIPROSTONE  (AMITIZA ) 24 MCG CAPSULE    TAKE 1 CAPSULE(24 MCG) BY MOUTH TWICE DAILY WITH A MEAL   METFORMIN  (GLUCOPHAGE ) 1000 MG TABLET    TAKE 1 TABLET(1000 MG) BY MOUTH TWICE DAILY WITH A MEAL   METHOTREXATE  (RHEUMATREX) 2.5 MG TABLET    TAKE 6 TABLETS(15 MG) BY MOUTH 1 TIME A WEEK   OMEPRAZOLE  (PRILOSEC) 40 MG CAPSULE    Take 1 capsule (40 mg total) by mouth daily before breakfast.   POLYETHYLENE GLYCOL POWDER (GLYCOLAX /MIRALAX ) 17 GM/SCOOP POWDER    Take 17 g by mouth 2 (two) times daily as needed.   PYLERA  140-125-125 MG CAPS    Take 3 capsules by mouth 4 (four) times daily - after meals and at bedtime.   SENNA (SENOKOT) 8.6 MG TABS TABLET    Take 2 tablets (17.2 mg total) by mouth daily.  Modified Medications   No medications on file  Discontinued Medications   No medications on file    Allergies Allergies  Allergen Reactions   Tylenol  [Acetaminophen ] Other (See Comments)    Stomach cramps     Past Medical History Past Medical History:  Diagnosis Date   Arthritis 2000   Diabetes mellitus without complication (HCC) 2005   Hemorrhoids    Hypercholesteremia 2005   Hypertension 2005   Sleep apnea 2010   Sleep disorder     Past Surgical History Past Surgical History:  Procedure Laterality Date   CARDIAC CATHETERIZATION  10/18/2007   Normal coronaries, systemic hypertension     Family History family history includes Alzheimer's disease in her maternal grandmother; Diabetes in her mother and sister; Heart attack in her mother; Hypercholesterolemia in  her mother; Hypertension in her brother, brother, mother, and sister.  Social History Social History   Socioeconomic History   Marital status: Single    Spouse name: Not on file   Number of children: 5   Years of education: Not on file   Highest education level: Not on file  Occupational History   Not on file  Tobacco Use   Smoking status: Former    Current packs/day: 0.00    Types: Cigarettes    Quit date: 12/27/2012    Years since quitting: 11.4   Smokeless tobacco: Never  Vaping Use   Vaping status: Never Used  Substance and Sexual Activity   Alcohol use: No   Drug use: No   Sexual activity: Yes    Birth control/protection: Post-menopausal  Other Topics Concern   Not on file  Social History Narrative   Ms. 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   Social Drivers of Health   Tobacco Use: Medium Risk (05/25/2024)   Patient History    Smoking Tobacco Use: Former    Smokeless Tobacco Use: Never    Passive Exposure: Not on file  Financial Resource Strain: Not on file  Food Insecurity: Not on file  Transportation Needs: Not on file  Physical Activity: Not on file  Stress: Not on file  Social Connections: Not on file  Intimate Partner Violence: Not on file  Depression (PHQ2-9): Low Risk (11/22/2023)   Depression (PHQ2-9)    PHQ-2 Score: 4  Alcohol Screen: Not on file  Housing: Not on file  Utilities: Not on file  Health Literacy: Not on file    Lab Results  Component Value Date   HGBA1C 6.0 (A) 05/25/2024   HGBA1C 5.8 (A) 04/11/2024   HGBA1C 6.6 (A) 11/22/2023   Lab Results  Component Value Date   CHOL 103 05/22/2024   Lab Results   Component Value Date   HDL 40 (L) 05/22/2024   Lab Results  Component Value Date   LDLCALC 51 05/22/2024   Lab Results  Component Value Date   TRIG 50 05/22/2024   Lab Results  Component Value Date   CHOLHDL 2.6 05/22/2024   Lab Results  Component Value Date   CREATININE 0.72 02/10/2024   No results found for: GFR Lab Results  Component Value Date   MICROALBUR 1.5 04/11/2024      Component Value Date/Time   NA 138 02/10/2024 0808   NA 138 08/24/2023 1033   K 3.3 (L) 02/10/2024 0808   CL 100 02/10/2024 0808   CO2 25 02/10/2024 0808   GLUCOSE 143 (H) 02/10/2024 0808   BUN 12 02/10/2024 0808   BUN 9 08/24/2023 1033   CREATININE 0.72 02/10/2024 0808   CALCIUM  9.3 02/10/2024 0808   PROT 7.8 08/24/2023 1033   ALBUMIN 4.8 08/24/2023 1033   AST 25 08/24/2023 1033   ALT 20 08/24/2023 1033   ALKPHOS 109 08/24/2023 1033   BILITOT 0.6 08/24/2023 1033   GFRNONAA >60 06/18/2023 1037   GFRNONAA >89 04/27/2016 1123   GFRAA 108 12/19/2019 0946   GFRAA >89 04/27/2016 1123      Latest Ref Rng & Units 02/10/2024    8:08 AM 08/24/2023   10:33 AM 06/18/2023   10:37 AM  BMP  Glucose 65 - 99 mg/dL 856  95  106   BUN 7 - 25 mg/dL 12  9  7    Creatinine 0.50 - 1.05 mg/dL 9.27  9.20  9.33   BUN/Creat Ratio 6 - 22 (calc) SEE NOTE:  11    Sodium 135 - 146 mmol/L 138  138  136   Potassium 3.5 - 5.3 mmol/L 3.3  4.2  3.2   Chloride 98 - 110 mmol/L 100  100  100   CO2 20 - 32 mmol/L 25  21  24    Calcium  8.6 - 10.4 mg/dL 9.3  89.7  9.7        Component Value Date/Time   WBC 8.7 08/24/2023 1029   WBC 11.4 (H) 06/18/2023 1037   RBC 4.60 08/24/2023 1029   RBC 4.61 06/18/2023 1037   HGB 13.4 08/24/2023 1029   HCT 40.5 08/24/2023 1029   PLT 320 08/24/2023 1029   MCV 88 08/24/2023 1029   MCH 29.1 08/24/2023 1029   MCH 28.6 06/18/2023 1037   MCHC 33.1 08/24/2023 1029   MCHC 34.1 06/18/2023 1037   RDW 14.0 08/24/2023 1029   LYMPHSABS 3.1 08/24/2023 1029   MONOABS 0.6 06/18/2023  1037   EOSABS 0.5 (H) 08/24/2023 1029   BASOSABS 0.0 08/24/2023 1029     Parts of this note may have been dictated using voice recognition software. There may be variances in spelling and vocabulary which are unintentional. Not all errors are proofread. Please notify the dino if any discrepancies are noted or if the meaning of any statement is not clear.   "

## 2024-05-28 ENCOUNTER — Other Ambulatory Visit: Payer: Self-pay

## 2024-05-28 DIAGNOSIS — E119 Type 2 diabetes mellitus without complications: Secondary | ICD-10-CM

## 2024-05-29 ENCOUNTER — Ambulatory Visit: Payer: Self-pay

## 2024-05-29 VITALS — BP 195/104 | HR 67 | Ht 67.0 in | Wt 221.2 lb

## 2024-05-29 DIAGNOSIS — L309 Dermatitis, unspecified: Secondary | ICD-10-CM

## 2024-05-29 DIAGNOSIS — Z780 Asymptomatic menopausal state: Secondary | ICD-10-CM

## 2024-05-29 DIAGNOSIS — Z23 Encounter for immunization: Secondary | ICD-10-CM | POA: Diagnosis not present

## 2024-05-29 DIAGNOSIS — Z13820 Encounter for screening for osteoporosis: Secondary | ICD-10-CM | POA: Diagnosis not present

## 2024-05-29 DIAGNOSIS — Z1231 Encounter for screening mammogram for malignant neoplasm of breast: Secondary | ICD-10-CM

## 2024-05-29 DIAGNOSIS — R1011 Right upper quadrant pain: Secondary | ICD-10-CM | POA: Diagnosis not present

## 2024-05-29 DIAGNOSIS — I10 Essential (primary) hypertension: Secondary | ICD-10-CM | POA: Diagnosis not present

## 2024-05-29 MED ORDER — AMLODIPINE BESYLATE 10 MG PO TABS: 10.0000 mg | ORAL_TABLET | Freq: Every day | ORAL | 1 refills | Status: AC

## 2024-05-29 MED ORDER — BETAMETHASONE VALERATE 0.1 % EX CREA: TOPICAL_CREAM | Freq: Two times a day (BID) | CUTANEOUS | 0 refills | Status: AC | PRN

## 2024-05-29 NOTE — Assessment & Plan Note (Signed)
 Ongoing for several years, progressing over the last 2 years, history of IBS/chronic constipation.  Has not been taking any MiraLAX  or senna.  Reports a bowel movement once a week, and denies any straining.  Expressed concerns regarding taking laxatives daily. Patient education regarding management of constipation and safety of treatment. Daily magnesium  supplement Using MiraLAX  1-2 times a day and senna 1-2 times a day. Urged adequate hydration.

## 2024-05-29 NOTE — Progress Notes (Signed)
" ° ° °  SUBJECTIVE:   CHIEF COMPLAINT / HPI:   Sinusitis: Reports no symptoms, completed antibiotics, no concerns.  Right leg rash: Started 5 years ago, biopsy done 02/08/2020 found spongiotic dermatitis c/w eczema, symptoms improved with aquaphore and previous steroid cream.  No swelling or pain to palpation.  Abdominal pain: Most notable when standing for an hour or two, starts to feel a knot in her stomach. Not worse with eating food, no N/V. Located in RUQ. Ongoing cramps like this pain for over 20 years, getting a little worse over last 2 years. Reports BM about once a week, always has needed laxative to have more regular BM, has previous diagnosis of IBS and previously on Linzess .  Last 2 magnesium  levels 1.511 months ago and 1.6 7 years ago.  HTN: Has not been taking antihypertensives last couple of days due to endocrine lab workup, which should complete today.  Reports that she will resume taking today.  Denies headache/dizziness/lightheadedness, changes in vision.  PERTINENT  PMH / PSH: Throat arthritis, seronegative arthropathy, CAD  OBJECTIVE:   BP (!) 195/104   Pulse 67   Ht 5' 7 (1.702 m)   Wt 221 lb 3.2 oz (100.3 kg)   SpO2 100%   BMI 34.64 kg/m    Cardiac: Regular rate and rhythm. Normal S1/S2. No murmurs, rubs, or gallops appreciated. Lungs: Clear bilaterally to ascultation.  Abdomen: Normoactive bowel sounds. No tenderness to deep or light palpation.  Abdomen firm patient on bilateral flanks.   Psych: Pleasant and appropriate  Extremities: 10 x 10 cm hyperpigmented rash, not raised, not warm, nontender to palpation   ASSESSMENT/PLAN:   Assessment & Plan Eczema, unspecified type Poorly controlled, intermittently recurrent worsening and improved with Aquaphor and topical steroid.  No swelling or tenderness on exam, suspect symptoms still 2/2 eczema. Refilled steroid cream with formulary alternative to Methasone 0.1% cream, can consider increasing if minimal  improvement twice daily as needed if worsening despite Aquaphor Aquaphor twice daily Right upper quadrant abdominal pain Ongoing for several years, progressing over the last 2 years, history of IBS/chronic constipation.  Has not been taking any MiraLAX  or senna.  Reports a bowel movement once a week, and denies any straining.  Expressed concerns regarding taking laxatives daily. Patient education regarding management of constipation and safety of treatment. Daily magnesium  supplement Using MiraLAX  1-2 times a day and senna 1-2 times a day. Urged adequate hydration. Primary hypertension Blood pressure is very elevated today, 195/104 was the better 2 readings, patient has not been taking antihypertensives due to lab collection's today.  No red flag symptoms and strict ED return precautions given.  BPs previously well-controlled at endocrinologist 122/80. Resume lisinopril  40 mg daily, HCTZ 25 mg daily, amlodipine  10 mg daily today after lab collection. Will follow-up with endocrinologist 1/29 Encounter for screening mammogram for malignant neoplasm of breast Encounter for osteoporosis screening in asymptomatic postmenopausal patient Ordered mammogram and DEXA scan today Encounter for immunization Received PCV today     Fairy Amy, MD [facility] "

## 2024-05-29 NOTE — Assessment & Plan Note (Signed)
 Blood pressure is very elevated today, 195/104 was the better 2 readings, patient has not been taking antihypertensives due to lab collection's today.  No red flag symptoms and strict ED return precautions given.  BPs previously well-controlled at endocrinologist 122/80. Resume lisinopril  40 mg daily, HCTZ 25 mg daily, amlodipine  10 mg daily today after lab collection. Will follow-up with endocrinologist 1/29

## 2024-05-29 NOTE — Patient Instructions (Addendum)
 Thank you for visiting the clinic today, it was good to see you!  Please always bring your medication bottles  In today's visit we discussed:  Right leg erythema: Please continue to plan Aquaphor twice a day, if the rash does not improve we can apply the betamethasone  cream that I prescribed.  If the rash does not improve please schedule follow-up visit and we can increase the strength of the medication.  Hypertension: Your blood pressure was very elevated today in clinic, and he has not been taking the blood pressure medication for the adrenal studies, please resume medication once the samples have been collected.  If your headache worsens in intensity, does not improve with Tylenol /ibuprofen , or you note new or changes in vision/dizziness/lightheadedness the symptoms are concerning and you should go to an urgent care.  Abdominal pain: You are currently getting a further workup from endocrinology regarding the decrease in appetite, but I do suspect that your abdominal pain may be related to low magnesium  as well as constipation.  Take MiraLAX  1-2 times a day and you can also start taking senna 1-2 times a day as needed to achieve a bowel movement at least every other day.  Also drink lots of water, at least 3-4 bottles a day.  If these do not help we will need to further evaluate, as it appears that you have had a diagnosis of irritable bowel syndrome in the past.  You have also had low magnesium  in the past and that may be related to these cramping pains, please take a daily magnesium  supplement you can find over-the-counter at your pharmacy.  Take this daily  Maintenance: You are due for mammogram and a bone density scan which I have ordered initially contact you for scheduling.  Also gave you the PCV shot  Please follow-up in two months  For any questions, please call the office at [phone] or send me a message in MyChart. Have a great day!  -Fairy Amy, MD  [facility]  Resident, PGY-1

## 2024-05-29 NOTE — Telephone Encounter (Signed)
 Chart reviewed, pt scheduled today  -Fairy Amy, MD

## 2024-05-30 ENCOUNTER — Other Ambulatory Visit

## 2024-05-31 LAB — DEXAMETHASONE, BLOOD: Dexamethasone, Serum: 2375.7 ng/dL

## 2024-05-31 LAB — CORTISOL: Cortisol, Plasma: 9.2 ug/dL

## 2024-06-01 ENCOUNTER — Other Ambulatory Visit

## 2024-06-06 LAB — CORTISOL, URINE, 24 HOUR
24 Hour urine volume (VMAHVA): 2250 mL
CREATININE, URINE: 1.28 g/(24.h) (ref 0.50–2.15)
Cortisol (Ur), Free: 21.9 ug/(24.h) (ref 4.0–50.0)

## 2024-06-12 LAB — CORTISOL, LC/MS, SALIVA, 2 SAMPLES
Cortisol Savliva Sample: <0.03 ug/dL
Cortisol, Saliva Sample: 0.06 ug/dL
Draw Time: 0
Draw Time: 0

## 2024-06-14 ENCOUNTER — Ambulatory Visit

## 2024-06-15 ENCOUNTER — Ambulatory Visit

## 2024-06-15 ENCOUNTER — Other Ambulatory Visit

## 2024-06-15 ENCOUNTER — Ambulatory Visit: Admitting: "Endocrinology

## 2024-06-15 ENCOUNTER — Encounter: Payer: Self-pay | Admitting: "Endocrinology

## 2024-06-15 ENCOUNTER — Ambulatory Visit
Admission: RE | Admit: 2024-06-15 | Discharge: 2024-06-15 | Disposition: A | Discharge status: Home / Self Care | Source: Ambulatory Visit | Attending: Family Medicine | Admitting: Family Medicine

## 2024-06-15 VITALS — BP 130/80 | HR 83 | Ht 67.0 in | Wt 216.0 lb

## 2024-06-15 DIAGNOSIS — E1140 Type 2 diabetes mellitus with diabetic neuropathy, unspecified: Secondary | ICD-10-CM

## 2024-06-15 DIAGNOSIS — E7800 Pure hypercholesterolemia, unspecified: Secondary | ICD-10-CM

## 2024-06-15 DIAGNOSIS — D3501 Benign neoplasm of right adrenal gland: Secondary | ICD-10-CM

## 2024-06-15 DIAGNOSIS — Z7984 Long term (current) use of oral hypoglycemic drugs: Secondary | ICD-10-CM

## 2024-06-15 DIAGNOSIS — R634 Abnormal weight loss: Secondary | ICD-10-CM

## 2024-06-15 DIAGNOSIS — Z1231 Encounter for screening mammogram for malignant neoplasm of breast: Secondary | ICD-10-CM

## 2024-06-15 MED ORDER — BLOOD GLUCOSE TEST VI STRP: 1.0000 | ORAL_STRIP | Freq: Three times a day (TID) | 3 refills | Status: AC

## 2024-06-15 MED ORDER — LANCETS MISC: 1.0000 | 0 refills | Status: DC

## 2024-06-15 MED ORDER — BLOOD GLUCOSE MONITORING SUPPL DEVI: 1.0000 | Freq: Three times a day (TID) | 0 refills | Status: AC

## 2024-06-15 MED ORDER — LANCET DEVICE MISC: 1.0000 | Freq: Three times a day (TID) | 0 refills | Status: AC

## 2024-06-15 NOTE — Progress Notes (Signed)
 "   Outpatient Endocrinology Note [name], MD  06/15/24   Tanya Owens 17-Apr-1961 [id number]  Referring Provider: Lorrane Pac, MD Primary Care Provider: Lorrane Pac, MD Reason for consultation: Subjective   Assessment & Plan  Bilan was seen today for diabetes.  Diagnoses and all orders for this visit:  Type 2 diabetes mellitus with diabetic neuropathy, without long-term current use of insulin (HCC)  Weight loss  Long term (current) use of oral hypoglycemic drugs  Pure hypercholesterolemia  Adenoma of right adrenal gland    No concerning features for Cushing's such as dorsocervical/supraclavicular fat pad/purple striae/cushingoid features or facies 08/13/2023 CT ABDOMEN WITHOUT CONTRAST: Adrenals/Urinary Tract: The right adrenal gland demonstrates no significant change compared with prior examination again noted is the presence of a well-defined, low-attenuation right adrenal gland mass that measures 3 x 2.8 cm with a mean attenuation value of 10 Hounsfield units. Similar to prior examination may represent adenoma. Stable since prior examination. The second more superior nodule in the right adrenal gland measures 2.2 x 1.7 cm also unchanged since prior examination. No other masses 02/10/24: baseline 8 AM adrenal labs: WNL except undetectable ACTH ; 1 mg came back abnormal with cortisol at 6.8 (adequately raised dexamethasone ) 05/22/24: 8 mg dexamethasone  suppression test abnormal with cortisol at 9.2 05/29/24 Salivary cortisol x 2 at 11 pm WNL, 24 hr urine cortisol 21.9, WNL; also weight loss goes against Cushing's disease/syndrome Repeat adrenal labs in 102026  Component     Latest Ref Rng 02/10/2024  Metanephrine, Pl     <=57 pg/mL <25   Normetanephrine, Pl     <=148 pg/mL 60   Total Metanephrines-Plasma     <=205 pg/mL 60   ALDOSTERONE      ng/dL 2   Renin Activity     0.25 - 5.82 ng/mL/h 0.25   ALDO / PRA Ratio     0.9 - 28.9 Ratio 8.0   DHEA-SO4      9 - 118 mcg/dL 78   R793 ACTH      6 - 50 pg/mL <5 (L)   Cortisol, Plasma     mcg/dL 85.9     Diabetes Type II complicated by neuropathy, No results found for: GFR Hba1c goal less than 7, current Hba1c is  Lab Results  Component Value Date   HGBA1C 6.0 (A) 05/25/2024   Will recommend the following: Metformin  1000 mg bid  No known contraindications/side effects to any of above medications  -Last LD and Tg are as follows: Lab Results  Component Value Date   LDLCALC 51 05/22/2024    Lab Results  Component Value Date   TRIG 50 05/22/2024   -On atorvastatin  80 mg QD -Follow low fat diet and exercise   -Blood pressure goal <140/90 - Microalbumin/creatinine goal is < 30 -Last MA/Cr is as follows: Lab Results  Component Value Date   MICROALBUR 1.5 04/11/2024   -on ACE/ARB lisinopril  40 mg every day  -diet changes including salt restriction -limit eating outside -counseled BP targets per standards of diabetes care -uncontrolled blood pressure can lead to retinopathy, nephropathy and cardiovascular and atherosclerotic heart disease  Reviewed and counseled on: -A1C target -Blood sugar targets -Complications of uncontrolled diabetes  -Checking blood sugar before meals and bedtime and bring log next visit -All medications with mechanism of action and side effects -Hypoglycemia management: rule of 15's, Glucagon  Emergency Kit and medical alert ID -low-carb low-fat plate-method diet -At least 20 minutes of physical activity per day -Annual dilated retinal eye  exam and foot exam -compliance and follow up needs -follow up as scheduled or earlier if problem gets worse  Call if blood sugar is less than 70 or consistently above 250    Take a 15 gm snack of carbohydrate at bedtime before you go to sleep if your blood sugar is less than 100.    If you are going to fast after midnight for a test or procedure, ask your physician for instructions on how to reduce/decrease your  insulin dose.    Call if blood sugar is less than 70 or consistently above 250  -Treating a low sugar by rule of 15  (15 gms of sugar every 15 min until sugar is more than 70) If you feel your sugar is low, test your sugar to be sure If your sugar is low (less than 70), then take 15 grams of a fast acting Carbohydrate (3-4 glucose tablets or glucose gel or 4 ounces of juice or regular soda) Recheck your sugar 15 min after treating low to make sure it is more than 70 If sugar is still less than 70, treat again with 15 grams of carbohydrate          Don't drive the hour of hypoglycemia  If unconscious/unable to eat or drink by mouth, use glucagon  injection or nasal spray baqsimi  and call 911. Can repeat again in 15 min if still unconscious.  No follow-ups on file.   I have reviewed current medications, nurse's notes, allergies, vital signs, past medical and surgical history, family medical history, and social history for this encounter. Counseled patient on symptoms, examination findings, lab findings, imaging results, treatment decisions and monitoring and prognosis. The patient understood the recommendations and agrees with the treatment plan. All questions regarding treatment plan were fully answered.  [name], MD  06/15/24   History of Present Illness Tanya Owens is a 64 y.o. year old female who presents for follow up of Type II diabetes mellitus and Right adrenal adenoma.  Patient is on laxative due to constipation and since then the abdominal pain has resolved No nausea/vomiting Lost about 40 lbs in past 5 mo-unintentional; no fever/night sweats  08/13/2023 CT ABDOMEN WITHOUT CONTRAST  Adrenals/Urinary Tract: The right adrenal gland demonstrates no significant change compared with prior examination again noted is the presence of a well-defined, low-attenuation right adrenal gland mass that measures 3 x 2.8 cm with a mean attenuation value of 10 Hounsfield units. Similar to  prior examination may represent adenoma. Stable since prior examination. The second more superior nodule in the right adrenal gland measures 2.2 x 1.7 cm also unchanged since prior examination. No other masses  Tanya Owens was first diagnosed in 2015.   Diabetes education -  Home diabetes regimen: Metformin  1000 mg bid  COMPLICATIONS -  MI/Stroke -  retinopathy +  neuropathy -  nephropathy  BLOOD SUGAR DATA 83-200 per recall Checks 2 times/day  Physical Exam  BP 130/80   Pulse 83   Ht 5' 7 (1.702 m)   Wt 216 lb (98 kg)   SpO2 99%   BMI 33.83 kg/m    Constitutional: well developed, well nourished No concerning features for Cushing's such as dorsocervical/supraclavicular fat pad/purple striae/cushingoid features or facies Head: normocephalic, atraumatic Eyes: sclera anicteric, no redness Neck: supple Lungs: normal respiratory effort Neurology: alert and oriented Skin: dry, no appreciable rashes Musculoskeletal: no appreciable defects Psychiatric: normal mood and affect Diabetic Foot Exam - Simple   No data filed  Current Medications Patient's Medications  New Prescriptions   No medications on file  Previous Medications   AMLODIPINE  (NORVASC ) 10 MG TABLET    Take 1 tablet (10 mg total) by mouth daily.   AMOXICILLIN -CLAVULANATE (AUGMENTIN ) 875-125 MG TABLET    Take 1 tablet by mouth every 12 (twelve) hours.   ATORVASTATIN  (LIPITOR) 80 MG TABLET    TAKE 1 TABLET(80 MG) BY MOUTH DAILY   BETAMETHASONE  VALERATE (VALISONE ) 0.1 % CREAM    Apply topically 2 (two) times daily as needed (when rash on leg is not improving).   DEXAMETHASONE  (DECADRON ) 4 MG TABLET    Take 1 tablet (4 mg total) by mouth 2 (two) times daily with a meal.   ENBREL SURECLICK 50 MG/ML INJECTION    Inject into the skin.   ETANERCEPT 50 MG/ML SOCT    Inject into the skin.   FLUTICASONE  (FLONASE ) 50 MCG/ACT NASAL SPRAY    Place 1 spray into both nostrils daily.   FOLIC ACID  (FOLVITE ) 1 MG  TABLET    Take 1 mg by mouth daily.   GABAPENTIN  (NEURONTIN ) 100 MG CAPSULE    TAKE 1 CAPSULE(100 MG) BY MOUTH THREE TIMES DAILY   GUAIFENESIN  1200 MG TB12    Take 1 tablet (1,200 mg total) by mouth 2 (two) times daily as needed (Congestion).   HYDROCHLOROTHIAZIDE  (HYDRODIURIL ) 25 MG TABLET    TAKE 1 TABLET(25 MG) BY MOUTH DAILY   KETOCONAZOLE  (NIZORAL ) 2 % CREAM    APPLY TOPICALLY TO THE AFFECTED AREA TWICE DAILY   LISINOPRIL  (ZESTRIL ) 40 MG TABLET    Take 1 tablet (40 mg total) by mouth daily.   LUBIPROSTONE  (AMITIZA ) 24 MCG CAPSULE    TAKE 1 CAPSULE(24 MCG) BY MOUTH TWICE DAILY WITH A MEAL   METFORMIN  (GLUCOPHAGE ) 1000 MG TABLET    TAKE 1 TABLET(1000 MG) BY MOUTH TWICE DAILY WITH A MEAL   METHOTREXATE  (RHEUMATREX) 2.5 MG TABLET    TAKE 6 TABLETS(15 MG) BY MOUTH 1 TIME A WEEK   OMEPRAZOLE  (PRILOSEC) 40 MG CAPSULE    Take 1 capsule (40 mg total) by mouth daily before breakfast.   POLYETHYLENE GLYCOL POWDER (GLYCOLAX /MIRALAX ) 17 GM/SCOOP POWDER    Take 17 g by mouth 2 (two) times daily as needed.   PYLERA  140-125-125 MG CAPS    Take 3 capsules by mouth 4 (four) times daily - after meals and at bedtime.   SENNA (SENOKOT) 8.6 MG TABS TABLET    Take 2 tablets (17.2 mg total) by mouth daily.  Modified Medications   No medications on file  Discontinued Medications   No medications on file    Allergies Allergies  Allergen Reactions   Tylenol  [Acetaminophen ] Other (See Comments)    Stomach cramps     Past Medical History Past Medical History:  Diagnosis Date   Arthritis 2000   Diabetes mellitus without complication (HCC) 2005   Hemorrhoids    Hypercholesteremia 2005   Hypertension 2005   Sleep apnea 2010   Sleep disorder     Past Surgical History Past Surgical History:  Procedure Laterality Date   CARDIAC CATHETERIZATION  10/18/2007   Normal coronaries, systemic hypertension    Family History family history includes Alzheimer's disease in her maternal grandmother; Diabetes in  her mother and sister; Heart attack in her mother; Hypercholesterolemia in her mother; Hypertension in her brother, brother, mother, and sister.  Social History Social History   Socioeconomic History   Marital status: Single    Spouse name: Not  on file   Number of children: 5   Years of education: Not on file   Highest education level: Not on file  Occupational History   Not on file  Tobacco Use   Smoking status: Former    Current packs/day: 0.00    Types: Cigarettes    Quit date: 12/27/2012    Years since quitting: 11.4   Smokeless tobacco: Never  Vaping Use   Vaping status: Never Used  Substance and Sexual Activity   Alcohol use: No   Drug use: No   Sexual activity: Yes    Birth control/protection: Post-menopausal  Other Topics Concern   Not on file  Social History Narrative   Ms. 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   Social Drivers of Health   Tobacco Use: Medium Risk (06/15/2024)   Patient History    Smoking Tobacco Use: Former    Smokeless Tobacco Use: Never    Passive Exposure: Not on file  Financial Resource Strain: Not on file  Food Insecurity: Not on file  Transportation Needs: Not on file  Physical Activity: Not on file  Stress: Not on file  Social Connections: Not on file  Intimate Partner Violence: Not on file  Depression (PHQ2-9): Low Risk (11/22/2023)   Depression (PHQ2-9)    PHQ-2 Score: 4  Alcohol Screen: Not on file  Housing: Not on file  Utilities: Not on file  Health Literacy: Not on file    Lab Results  Component Value Date   HGBA1C 6.0 (A) 05/25/2024   HGBA1C 5.8 (A) 04/11/2024   HGBA1C 6.6 (A) 11/22/2023   Lab Results  Component Value Date   CHOL 103 05/22/2024   Lab Results  Component Value Date   HDL 40 (L) 05/22/2024   Lab Results  Component Value Date   LDLCALC 51  05/22/2024   Lab Results  Component Value Date   TRIG 50 05/22/2024   Lab Results  Component Value Date   CHOLHDL 2.6 05/22/2024   Lab Results  Component Value Date   CREATININE 0.72 02/10/2024   No results found for: GFR Lab Results  Component Value Date   MICROALBUR 1.5 04/11/2024      Component Value Date/Time   NA 138 02/10/2024 0808   NA 138 08/24/2023 1033   K 3.3 (L) 02/10/2024 0808   CL 100 02/10/2024 0808   CO2 25 02/10/2024 0808   GLUCOSE 143 (H) 02/10/2024 0808   BUN 12 02/10/2024 0808   BUN 9 08/24/2023 1033   CREATININE 0.72 02/10/2024 0808   CALCIUM  9.3 02/10/2024 0808   PROT 7.8 08/24/2023 1033   ALBUMIN 4.8 08/24/2023 1033   AST 25 08/24/2023 1033   ALT 20 08/24/2023 1033   ALKPHOS 109 08/24/2023 1033   BILITOT 0.6 08/24/2023 1033   GFRNONAA >60 06/18/2023 1037   GFRNONAA >89 04/27/2016 1123   GFRAA 108 12/19/2019 0946   GFRAA >89 04/27/2016 1123      Latest Ref Rng & Units 02/10/2024    8:08 AM 08/24/2023   10:33 AM 06/18/2023   10:37 AM  BMP  Glucose 65 - 99 mg/dL 856  95  893   BUN 7 - 25 mg/dL 12  9  7    Creatinine 0.50 - 1.05 mg/dL 9.27  9.20  9.33  BUN/Creat Ratio 6 - 22 (calc) SEE NOTE:  11    Sodium 135 - 146 mmol/L 138  138  136   Potassium 3.5 - 5.3 mmol/L 3.3  4.2  3.2   Chloride 98 - 110 mmol/L 100  100  100   CO2 20 - 32 mmol/L 25  21  24    Calcium  8.6 - 10.4 mg/dL 9.3  89.7  9.7        Component Value Date/Time   WBC 8.7 08/24/2023 1029   WBC 11.4 (H) 06/18/2023 1037   RBC 4.60 08/24/2023 1029   RBC 4.61 06/18/2023 1037   HGB 13.4 08/24/2023 1029   HCT 40.5 08/24/2023 1029   PLT 320 08/24/2023 1029   MCV 88 08/24/2023 1029   MCH 29.1 08/24/2023 1029   MCH 28.6 06/18/2023 1037   MCHC 33.1 08/24/2023 1029   MCHC 34.1 06/18/2023 1037   RDW 14.0 08/24/2023 1029   LYMPHSABS 3.1 08/24/2023 1029   MONOABS 0.6 06/18/2023 1037   EOSABS 0.5 (H) 08/24/2023 1029   BASOSABS 0.0 08/24/2023 1029     Parts of this note may  have been dictated using voice recognition software. There may be variances in spelling and vocabulary which are unintentional. Not all errors are proofread. Please notify the dino if any discrepancies are noted or if the meaning of any statement is not clear.   "

## 2024-06-16 ENCOUNTER — Encounter: Payer: Self-pay | Admitting: Pediatrics

## 2024-06-16 LAB — T4, FREE: Free T4: 1.3 ng/dL (ref 0.8–1.8)

## 2024-06-16 LAB — TSH: TSH: 1.01 m[IU]/L (ref 0.40–4.50)

## 2024-06-17 ENCOUNTER — Other Ambulatory Visit: Payer: Self-pay | Admitting: Pediatrics

## 2024-06-17 ENCOUNTER — Other Ambulatory Visit: Payer: Self-pay | Admitting: "Endocrinology

## 2024-06-17 DIAGNOSIS — E1140 Type 2 diabetes mellitus with diabetic neuropathy, unspecified: Secondary | ICD-10-CM

## 2024-06-18 ENCOUNTER — Other Ambulatory Visit: Payer: Self-pay

## 2024-06-18 DIAGNOSIS — E119 Type 2 diabetes mellitus without complications: Secondary | ICD-10-CM

## 2024-06-20 NOTE — Telephone Encounter (Signed)
 Chart reviewed  -Fairy Amy, MD

## 2024-12-11 ENCOUNTER — Ambulatory Visit: Admitting: "Endocrinology
